# Patient Record
Sex: Male | Born: 1950 | Race: White | Hispanic: No | Marital: Single | Smoking: Former smoker
Health system: Southern US, Community
[De-identification: ages and names within clinical notes are randomized; demographics above are authoritative.]

## PROBLEM LIST (undated history)

## (undated) DIAGNOSIS — E119 Type 2 diabetes mellitus without complications: Secondary | ICD-10-CM

## (undated) DIAGNOSIS — N400 Enlarged prostate without lower urinary tract symptoms: Secondary | ICD-10-CM

## (undated) DIAGNOSIS — N189 Chronic kidney disease, unspecified: Secondary | ICD-10-CM

## (undated) DIAGNOSIS — E785 Hyperlipidemia, unspecified: Secondary | ICD-10-CM

## (undated) DIAGNOSIS — I10 Essential (primary) hypertension: Secondary | ICD-10-CM

## (undated) DIAGNOSIS — G459 Transient cerebral ischemic attack, unspecified: Secondary | ICD-10-CM

## (undated) DIAGNOSIS — K219 Gastro-esophageal reflux disease without esophagitis: Secondary | ICD-10-CM

## (undated) DIAGNOSIS — E039 Hypothyroidism, unspecified: Secondary | ICD-10-CM

## (undated) DIAGNOSIS — F32A Depression, unspecified: Secondary | ICD-10-CM

## (undated) DIAGNOSIS — F329 Major depressive disorder, single episode, unspecified: Secondary | ICD-10-CM

## (undated) HISTORY — PX: POLYPECTOMY: SHX149

## (undated) HISTORY — PX: VASECTOMY: SHX75

## (undated) HISTORY — DX: Essential (primary) hypertension: I10

## (undated) HISTORY — DX: Transient cerebral ischemic attack, unspecified: G45.9

## (undated) HISTORY — PX: CYSTOURETHROSCOPY: SHX476

## (undated) HISTORY — PX: APPENDECTOMY: SHX54

## (undated) HISTORY — DX: Chronic kidney disease, unspecified: N18.9

## (undated) HISTORY — DX: Gastro-esophageal reflux disease without esophagitis: K21.9

## (undated) HISTORY — DX: Depression, unspecified: F32.A

## (undated) HISTORY — DX: Type 2 diabetes mellitus without complications: E11.9

## (undated) HISTORY — PX: CHOLECYSTECTOMY: SHX55

## (undated) HISTORY — DX: Major depressive disorder, single episode, unspecified: F32.9

---

## 2012-12-29 ENCOUNTER — Ambulatory Visit: Payer: Self-pay | Admitting: Family Medicine

## 2013-01-08 ENCOUNTER — Ambulatory Visit: Payer: Self-pay | Admitting: Family Medicine

## 2013-01-09 LAB — COMPREHENSIVE METABOLIC PANEL
Alkaline Phosphatase: 131 U/L — ABNORMAL HIGH
Bilirubin,Total: 0.3 mg/dL (ref 0.2–1.0)
Calcium, Total: 9.8 mg/dL (ref 8.5–10.1)
Chloride: 103 mmol/L (ref 98–107)
EGFR (African American): 33 — ABNORMAL LOW
EGFR (Non-African Amer.): 28 — ABNORMAL LOW
Glucose: 372 mg/dL — ABNORMAL HIGH (ref 65–99)
Osmolality: 293 (ref 275–301)
Potassium: 5.5 mmol/L — ABNORMAL HIGH (ref 3.5–5.1)
SGOT(AST): 26 U/L (ref 15–37)
Sodium: 133 mmol/L — ABNORMAL LOW (ref 136–145)

## 2013-01-09 LAB — URINALYSIS, COMPLETE
Bacteria: NONE SEEN
Glucose,UR: >=500 mg/dL (ref 0–75)
Ketone: NEGATIVE
Leukocyte Esterase: NEGATIVE
Nitrite: NEGATIVE
Ph: 6 (ref 4.5–8.0)
Protein: 100
Specific Gravity: 1.015 (ref 1.003–1.030)
Squamous Epithelial: NONE SEEN

## 2013-01-09 LAB — CBC WITH DIFFERENTIAL/PLATELET
Basophil #: 0 10*3/uL (ref 0.0–0.1)
Eosinophil #: 0.2 10*3/uL (ref 0.0–0.7)
HGB: 12.6 g/dL — ABNORMAL LOW (ref 13.0–18.0)
Lymphocyte #: 4.4 10*3/uL — ABNORMAL HIGH (ref 1.0–3.6)
MCHC: 33.4 g/dL (ref 32.0–36.0)
MCV: 95 fL (ref 80–100)
Monocyte #: 1.6 x10 3/mm — ABNORMAL HIGH (ref 0.2–1.0)
Monocyte %: 16.8 %
Neutrophil #: 3.2 10*3/uL (ref 1.4–6.5)
Neutrophil %: 33.9 %
Platelet: 394 10*3/uL (ref 150–440)
RBC: 3.96 10*6/uL — ABNORMAL LOW (ref 4.40–5.90)
RDW: 12.9 % (ref 11.5–14.5)
WBC: 9.4 10*3/uL (ref 3.8–10.6)

## 2013-01-10 ENCOUNTER — Inpatient Hospital Stay: Payer: Self-pay | Admitting: Specialist

## 2013-01-10 DIAGNOSIS — R55 Syncope and collapse: Secondary | ICD-10-CM

## 2013-01-10 LAB — BASIC METABOLIC PANEL
Anion Gap: 5 — ABNORMAL LOW (ref 7–16)
BUN: 36 mg/dL — ABNORMAL HIGH (ref 7–18)
Calcium, Total: 8.7 mg/dL (ref 8.5–10.1)
Glucose: 315 mg/dL — ABNORMAL HIGH (ref 65–99)
Potassium: 5.1 mmol/L (ref 3.5–5.1)
Sodium: 135 mmol/L — ABNORMAL LOW (ref 136–145)

## 2013-01-11 LAB — BASIC METABOLIC PANEL
Calcium, Total: 8.3 mg/dL — ABNORMAL LOW (ref 8.5–10.1)
Co2: 24 mmol/L (ref 21–32)
EGFR (African American): 56 — ABNORMAL LOW
EGFR (Non-African Amer.): 48 — ABNORMAL LOW
Glucose: 245 mg/dL — ABNORMAL HIGH (ref 65–99)
Osmolality: 285 (ref 275–301)

## 2013-01-17 ENCOUNTER — Ambulatory Visit: Payer: Self-pay | Admitting: Family Medicine

## 2014-05-09 NOTE — H&P (Signed)
PATIENT NAME:  Dillon Thomas, Dillon Thomas MR#:  [id number] DATE OF BIRTH:  Dec 17, 1950  DATE OF ADMISSION:  01/10/2013  PRIMARY CARE PHYSICIAN: Dr. Kym Groom.   CHIEF COMPLAINT: Abnormal labs.   HISTORY OF PRESENT ILLNESS: This is a 64 year old male with a history of hypertension, chronic renal failure, diabetes, TIA who presents at his primary care physician's request for hyperkalemia. Apparently, the patient has recently moved from [place]. Had some labs at his PCP's office. They called him today because his potassium was 6.2, so he was asked to go to Urgent Care but Urgent Care was closed so he came here. Here, his potassium is 5.5. He received Kayexalate, and his kidney function is 2.36.   The patient also stated that over the past few days, he has fallen and at times he feels dizzy when standing up. He does not have his medications at this time. The patient says that he gets dizzy on standing and sometimes with turning his head. He actually has passed out a few times over the past week. No symptoms prior to passing out.     REVIEW OF SYSTEMS:  CONSTITUTIONAL: No fever. Positive fatigue. No weakness, weight loss, weight gain.  EYES: No blurred or double vision.  ENT: No ear pain, hearing loss, seasonal allergies, postnasal drip or sinus pain.  RESPIRATORY: No cough, wheezing, hemoptysis or painful respirations.  CARDIOVASCULAR: No chest pain, orthopnea, edema, arrhythmia. Positive syncope.  GASTROINTESTINAL: Positive history of GERD.   GENITOURINARY: No dysuria or hematuria.  ENDOCRINE: No polyuria or polydipsia.  HEMATOLOGIC AND LYMPHATIC: Positive easy bruising.  SKIN: No rash or lesions.   MUSCULOSKELETAL: Positive history of gout. No swelling in his joints.  NEUROLOGIC: Positive history of TIA.   PSYCHIATRIC: No history of anxiety or depression.   PAST MEDICAL HISTORY:  1. Hypothyroidism.  2. Waldenstrom macroglobulinemia.  3. History of COPD.   4. Peripheral neuropathy.  5. Diabetes.  6.  Chronic kidney disease.  7. GERD.   8. Gout.  9. Hyperlipidemia.  10. TIA.  11. Hypertension.   MEDICATIONS: The patient does not know his medications.   ALLERGIES: PENICILLIN AND TETRACYCLINE he says cause a rash and hives.   FAMILY HISTORY: No history of CAD or CVA.   SOCIAL HISTORY: The patient quit drinking. He has very occasional smoking, maybe once every few months. He is not interested in quitting this.   SURGICAL HISTORY:  1. Appendectomy.  2. Cholecystectomy.  3. Shoulder surgery.  4. Polyps on his vocal cord.   PHYSICAL EXAMINATION:  VITAL SIGNS: Temperature 97.9, pulse is 75, respirations 18, blood pressure 113/56 and 98% on room air.  GENERAL: The patient is alert, oriented, not in acute distress.  HEENT: Head is atraumatic. Pupils are round. Sclerae anicteric. Mucous membranes are moist. Oropharynx is clear.  NECK: Supple without JVD, carotid bruit or enlarged thyroid.  CARDIOVASCULAR: Regular rate and rhythm. No murmurs, gallops or rubs. PMI is not displaced.   ABDOMEN: Obese. Bowel sounds are positive. Nontender, nondistended. Hard to appreciate organomegaly due to body habitus.  EXTREMITIES: No clubbing, cyanosis or edema.  NEUROLOGIC: Cranial nerves II through XII are intact. No focal deficits. Cerebellar exam is normal.   MUSCULOSKELETAL: Strength is 5 out of 5 in all extremities. No pathology to digits or nails.  SKIN: Without rash or lesions.  BACK: No CVA or vertebral tenderness.   LABORATORIES: Sodium 133, potassium 5.5, chloride 103, bicarb 26, BUN 45, creatinine 2.36, glucose 372. Bilirubin 0.3, alk phos 131, ALT  40, AST 26, total protein 8.8. Urinalysis shows no LCE or nitrites. CT of the head shows no acute intracranial hemorrhage or CVA. Left hip x-ray shows no acute fracture. Chest x-ray shows no acute cardiopulmonary disease. EKG is normal sinus rhythm. No ST elevation or depression.   ASSESSMENT AND PLAN: A 64 year old male who was sent from primary  care physician's office with hyperkalemia and renal failure.  1. Hyperkalemia: Likely secondary to medications, although we do not have his medications, but I suspect the patient is taking an ACE inhibitor. He also has renal failure which is contributing to his hyperkalemia. He has received Kayexalate. Will continue to monitor his potassium. Once we know his medications, will have a clear indication as to the etiology of his hyperkalemia.  2. Acute renal failure on chronic renal failure: Unclear what his baseline is. He just recently moved here from [place]. We cannot obtain any medical history as this is a holiday, but on Friday we may be able to obtain some information if the patient is still here. Will order renal ultrasound. The patient will probably need a renal followup which can be arranged by his primary care physician or prior to discharge.  3. Syncope: The patient has had a few episodes of syncope. Will order an EEG as well as carotid Dopplers and echocardiogram to evaluate the clear etiology of this syncope. I cannot obtain a great story from this patient. Again, it is a holiday weekend. I am not sure exactly what will be able to be done, but I do want to rule out a cardiac or neurologic etiology, especially given his history of transient ischemic attacks in the past.  4. History of transient ischemic attack: Will continue Plavix as the patient does know he is taking Plavix daily.  5. Hypertension: Unknown what medications he is on. His blood pressure seems reasonable at this time. Will continue to monitor. The patient's wife is to bring in all medications tomorrow.  6. Diabetes: For now, the patient is on sliding scale insulin. Will continue to monitor. I will put him on an American Diabetic Association diet, and once we have his medication information, we may be able to restart his outpatient medications.   The patient is FULL CODE status.   TIME SPENT: Approximately 45 minutes.    ____________________________ Donell Beers. Benjie Karvonen, MD spm:gb D: 01/10/2013 00:34:54 ET T: 01/10/2013 01:48:14 ET JOB#: [id number]  cc: Soliyana Mcchristian P. Benjie Karvonen, MD, <Dictator> [name], MD Roberto Hlavaty P Yahmir Sokolov MD ELECTRONICALLY SIGNED 01/11/2013 0:01

## 2014-05-10 NOTE — Discharge Summary (Signed)
PATIENT NAME:  Dillon Thomas, Dillon Thomas MR#:  [id number] DATE OF BIRTH:  October 03, 1950  DATE OF ADMISSION:  01/10/2013 DATE OF DISCHARGE:  01/11/2013  For a detailed note, please take a look at the history and physical done on admission by Dr.  Benjie Karvonen.   DIAGNOSES AT DISCHARGE: As follows:  1.  Acute on chronic renal failure.  2.  Hyperkalemia.  3.  Syncope, likely vasovagal.  4.  Uncontrolled diabetes.  5.  Hypertension.  6.  Hyperlipidemia.  7.  History of gout.  8.  Hypothyroidism.   DIET:  The patient is being discharged on an American diabetic Association low-sodium, low-fat diet.   ACTIVITY: As tolerated.   FOLLOW-UP:  With Dr. Johny Drilling in the next 1 to 2 weeks.   DISCHARGE MEDICATIONS:  Magnesium oxide 400 mg daily, Toprol-XL 37.5 mg daily, tramadol 50 mg q. 6 hours as needed, Wellbutrin XL 150 mg 3 tabs daily, vitamin D3 2000 international units daily, vitamin B12 500 mcg daily, Monopril 10 mg daily, vitamin D2 50,000 international units weekly, multivitamin daily, aspirin 81 mg daily, Plavix 75 mg daily, Bentyl 10 mg as needed, Flovent 1 puff b.i.d. as needed, Synthroid 25 mcg daily, Zantac 150 mg b.i.d., Crestor 10 mg daily, albuterol inhaler 2 puffs q. 2 hours as needed, FiberCon 625 mg daily, colchicine 0.6 mg 2 tabs as needed for gout flare, Insulin regular 22 units b.i.d., Neurontin 300 mg 2 caps in the morning 1 capsule at night, allopurinol 100 mg 2 tabs daily.   PERTINENT STUDIES DONE DURING THE HOSPITAL COURSE: Are as follows: A CT scan of the head done without contrast, which showed no acute intracranial process. An ultrasound of the carotids showing no hemodynamically significant carotid artery stenosis. Ultrasound of the kidneys showing no evidence of any hydronephrosis. A 2-dimensional echocardiogram showing left ventricular ejection fraction to be 55% to 60%, impaired relaxation pattern of LV diastolic filling.   HOSPITAL COURSE: This is a 64 year old male with medical problems  as mentioned above, presented to the hospital due to syncope and also noted to be in acute on chronic renal failure with hyperkalemia.   PROBLEMS: 1.  Syncope. The most likely cause of the patient's syncope was vasovagal in nature. The patient had an extensive work-up in the hospital for neurocardiogenic syncope, which was negative. His CT head was negative. His carotid duplex was negative. His echocardiogram showed normal ejection fraction. He had no alarms on telemetry or any evidence of arrhythmia. He has had no further episodes of syncope and therefore is being discharged home.   2.  Acute on chronic renal failure. The patient has chronic kidney disease stage III. His baseline creatinines is around 1.3 to 1.5. He presented to the hospital with creatinine of 2.3, and potassium of 5.5. This is probably related to prerenal azotemia complicated with the use of Bactrim for underlying infection. The patient was started on Bactrim for a scrotal cellulitis. The patient's kidney function got worse due to being on Bactrim. When he presented to the hospital, his creatinine was up to 2.3. He was hydrated with IV fluids. His Bactrim was held. He was also taken off his ACE inhibitors temporarily. His kidney function has now improved with IV fluids. He likely needs outpatient referral for Nephrology on a scheduled basis. He will be referred to [facility] for now. His renal ultrasound showed no evidence of hydronephrosis. The patient's creatinine is now back to baseline.   3.  Hyperkalemia. This was likely secondary  to the acute renal failure and the use of Bactrim. The patient was given some Kayexalate. His potassium has now normalized as his renal function has improved.   4.  Diabetes. The patient's blood sugars were somewhat labile although improved with sliding-scale insulin coverage. He will continue his regular insulin twice daily as mentioned and further titrations to his diabetic meds should be done  by his primary care physician.   5.  Diabetic neuropathy. The patient was maintain on his Neurontin. He will resume that.   6.  Hyperlipidemia. The patient was maintained on his Crestor and he will resume that.    7.  Hypertension. The patient was maintained on his Toprol and Monopril and he will resume those upon discharge. He was taken off his HCTZ presently.   The patient is a FULL CODE.   DISPOSITION: He is being discharged home.   TIME SPENT WITH DISCHARGE:  40 minutes.   ____________________________ Belia Heman. Verdell Carmine, MD vjs:dp D: 01/11/2013 22:00:01 ET T: 01/12/2013 07:10:11 ET JOB#: [id number]  cc: Belia Heman. Verdell Carmine, MD, <Dictator> [name], MD Henreitta Leber MD ELECTRONICALLY SIGNED 02/13/2013 11:14

## 2014-10-20 ENCOUNTER — Ambulatory Visit: Payer: MEDICARE | Attending: Neurology

## 2014-10-20 DIAGNOSIS — G4733 Obstructive sleep apnea (adult) (pediatric): Secondary | ICD-10-CM | POA: Insufficient documentation

## 2014-12-15 ENCOUNTER — Ambulatory Visit
Admission: RE | Admit: 2014-12-15 | Discharge: 2014-12-15 | Disposition: A | Discharge status: Home / Self Care | Payer: MEDICARE | Source: Ambulatory Visit | Attending: Family Medicine | Admitting: Family Medicine

## 2014-12-15 ENCOUNTER — Other Ambulatory Visit: Payer: Self-pay | Admitting: Family Medicine

## 2014-12-15 DIAGNOSIS — R109 Unspecified abdominal pain: Secondary | ICD-10-CM | POA: Insufficient documentation

## 2014-12-15 DIAGNOSIS — R319 Hematuria, unspecified: Secondary | ICD-10-CM | POA: Diagnosis present

## 2014-12-15 DIAGNOSIS — N200 Calculus of kidney: Secondary | ICD-10-CM | POA: Insufficient documentation

## 2014-12-15 DIAGNOSIS — R599 Enlarged lymph nodes, unspecified: Secondary | ICD-10-CM | POA: Insufficient documentation

## 2014-12-15 DIAGNOSIS — N289 Disorder of kidney and ureter, unspecified: Secondary | ICD-10-CM | POA: Insufficient documentation

## 2015-08-18 ENCOUNTER — Ambulatory Visit: Payer: MEDICARE | Attending: Neurology | Admitting: Speech Pathology

## 2015-08-18 DIAGNOSIS — R4701 Aphasia: Secondary | ICD-10-CM | POA: Insufficient documentation

## 2015-08-18 DIAGNOSIS — R41841 Cognitive communication deficit: Secondary | ICD-10-CM | POA: Insufficient documentation

## 2015-08-20 ENCOUNTER — Ambulatory Visit: Payer: MEDICARE | Admitting: Speech Pathology

## 2015-08-20 DIAGNOSIS — R4701 Aphasia: Secondary | ICD-10-CM | POA: Diagnosis not present

## 2015-08-20 DIAGNOSIS — R41841 Cognitive communication deficit: Secondary | ICD-10-CM

## 2015-08-21 ENCOUNTER — Encounter: Payer: Self-pay | Admitting: Speech Pathology

## 2015-08-21 NOTE — Therapy (Signed)
[place] MAIN [facility] SERVICES [address] [place], [place], [address] Phone: [phone]   Fax:  [phone]  Speech Language Pathology Evaluation  Patient Details  Name: Dillon Thomas MRN: [id number] Date of Birth: 1950/02/13 Referring Provider: Dr. Melrose Nakayama  Encounter Date: 08/20/2015      End of Session - 08/21/15 1206    Visit Number 1   Number of Visits 17   Date for SLP Re-Evaluation 10/23/15   SLP Start Time 23   SLP Stop Time  1200   SLP Time Calculation (min) 60 min   Activity Tolerance Patient tolerated treatment well      Past Medical History:  Diagnosis Date  . Chronic kidney disease   . Depression   . Diabetes mellitus without complication ([place])   . GERD (gastroesophageal reflux disease)   . Hypertension   . TIA (transient ischemic attack)     History reviewed. No pertinent surgical history.  There were no vitals filed for this visit.          SLP Evaluation [facility] - 08/21/15 0001      SLP Visit Information   SLP Received On 08/20/15   Referring Provider Dr. Melrose Nakayama   Onset Date 08/12/2015   Medical Diagnosis Word Finding Difficulty, Mild Cognitive Impairment      Subjective   Subjective 65 year old man referred for speech therapy secondary word finding difficulty and Mild Cognitive Impairment.   Patient/Family Stated Goal The patient is having difficulty expressing his personal goals     Prior Functional Status   Cognitive/Linguistic Baseline Baseline deficits   Baseline deficit details --  Deficits noted 6-7 years ago following TIA     Cognition   Overall Cognitive Status History of cognitive impairments - at baseline  Mild Cognitive Impairment     Auditory Comprehension   Overall Auditory Comprehension Impaired   Overall Auditory Comprehension Comments Reduced for abstract verbal information     Reading Comprehension   Reading Status Impaired  Reduced for abstract written information     Verbal Expression   Overall Verbal Expression Impaired     Written Expression   Overall Writen Expression May have baseline writing impairment     Oral Motor/Sensory Function   Overall Oral Motor/Sensory Function Appears within functional limits for tasks assessed     Motor Speech   Overall Motor Speech Appears within functional limits for tasks assessed     Standardized Assessments   Standardized Assessments  Western Aphasia Battery revised        Western Aphasia Battery- Revised   Spontaneous Speech      Information content  9/10       Fluency   10/10      Comprehension     Yes/No questions  60/60        Auditory Word Recognition 60/60        Sequential Commands 70/80     Repetition   96/100      Naming    Object Naming  57/60        Word Fluency   12/20        Sentence Completion 10/10        Responsive Speech   10/10      Aphasia Quotient  94/100    Reading and Writing    Reading   84/100        Writing   Incomplete testing        SLP Education -  08/21/15 1206    Education provided Yes   Education Details Role of SLP in addressing aphasia and mild cognitive impairment   Person(s) Educated Patient   Methods Explanation   Comprehension Verbalized understanding            SLP Long Term Goals - 08/21/15 1209      SLP LONG TERM GOAL #1   Title Patient will complete high level word finding tasks with 80% accuracy.   Time 8   Period Weeks   Status New     SLP LONG TERM GOAL #2   Title Patient will write grammatical and cogent sentence to complete simple/concrete linguistic task with 80% accuracy.   Time 8   Period Weeks   Status New     SLP LONG TERM GOAL #3   Title Patient will demonstrate reading comprehension for paragraphs with 80% accuracy.    Time 8   Period Weeks   Status New     SLP LONG TERM GOAL #4   Title Patient will identify cognitive/communication barriers and participate in developing functional compensatory strategies.   Time  8   Period Weeks   Status New     SLP LONG TERM GOAL #5   Title Patient will demonstrate functional cognitive-communication skills for independent completion of personal responsibilities.   Time 8   Period Weeks   Status New          Plan - 08/21/15 1207    Clinical Impression Statement 65 year old man with word finding difficulty and Mild Cognitive Impairment is presenting with mild aphasia characterized by word finding difficulty, reduced information content of spontaneous speech, and reduced comprehension of abstract oral/written information.  Although testing of writing is incomplete, the patient appears to have baseline writing difficulties.  The patient will benefit from skilled speech therapy for restorative and compensatory treatment of aphasia as well as ongoing assessment of cognitive needs.      Speech Therapy Frequency 2x / week   Duration Other (comment)  8 weeks   Treatment/Interventions Cognitive reorganization;Internal/external aids;Functional tasks;Compensatory strategies;Patient/family education   Potential to Achieve Goals Good   Potential Considerations Ability to learn/carryover information;Co-morbidities;Cooperation/participation level;Previous level of function;Severity of impairments;Family/community support   SLP Home Exercise Plan Patient is to return with examples of functional communication and cognitive problems   Consulted and Agree with Plan of Care Patient      Patient will benefit from skilled therapeutic intervention in order to improve the following deficits and impairments:   Aphasia - Plan: SLP plan of care cert/re-cert  Cognitive communication deficit - Plan: SLP plan of care cert/re-cert      G-Codes - [id number] 05-13-09    Functional Assessment Tool Used Western Aphasia Battery- REvised, clinical judgment   Functional Limitations Spoken language expressive   Spoken Language Expression Current Status [phone]) At least 20 percent but less than 40  percent impaired, limited or restricted   Spoken Language Expression Goal Status [id number]) At least 1 percent but less than 20 percent impaired, limited or restricted      Problem List There are no active problems to display for this patient.  [name], MS/CCC- SLP  Lou Miner 08/21/2015, 12:15 PM  [place] MAIN [facility] SERVICES [address] [place], [place], [address] Phone: [phone]   Fax:  [phone]  Name: Dillon Thomas MRN: [id number] Date of Birth: 11/03/1950

## 2015-08-25 ENCOUNTER — Ambulatory Visit: Payer: MEDICARE | Admitting: Speech Pathology

## 2015-08-25 DIAGNOSIS — R4701 Aphasia: Secondary | ICD-10-CM | POA: Diagnosis not present

## 2015-08-25 DIAGNOSIS — R41841 Cognitive communication deficit: Secondary | ICD-10-CM

## 2015-08-26 ENCOUNTER — Encounter: Payer: Self-pay | Admitting: Speech Pathology

## 2015-08-26 NOTE — Therapy (Signed)
[place] MAIN [facility] SERVICES [address] [place], [place], [address] Phone: [phone]   Fax:  [phone]  Speech Language Pathology Treatment  Patient Details  Name: Dillon Thomas MRN: [id number] Date of Birth: 01/16/1951 Referring Provider: Dr. Melrose Nakayama  Encounter Date: 08/25/2015      End of Session - 08/26/15 1430    Visit Number 2   Number of Visits 17   Date for SLP Re-Evaluation 10/23/15   SLP Start Time 1000   SLP Stop Time  1100   SLP Time Calculation (min) 60 min   Activity Tolerance Patient tolerated treatment well      Past Medical History:  Diagnosis Date  . Chronic kidney disease   . Depression   . Diabetes mellitus without complication ([place])   . GERD (gastroesophageal reflux disease)   . Hypertension   . TIA (transient ischemic attack)     History reviewed. No pertinent surgical history.  There were no vitals filed for this visit.      Subjective Assessment - 08/26/15 1429    Subjective The patient returned with requested information   Currently in Pain? No/denies               ADULT SLP TREATMENT - 08/26/15 0001      General Information   Behavior/Cognition Alert;Cooperative;Pleasant mood     Treatment Provided   Treatment provided Cognitive-Linquistic     Pain Assessment   Pain Assessment No/denies pain     Cognitive-Linquistic Treatment   Treatment focused on Aphasia;Cognition   Skilled Treatment WRITING: writing is functional for basic note taking/journal use.  AUDITORY COMPREHENSION/MEMORY: answer multi-unit yes/no questions easily.  VERBAL EXPRESSION: generate 5 items in a category with 70% accuracy.  Name category given 3 members with 90% accuracy.  COMMUNICATION/COGNITIVE BARRIERS: patient returned with list of communication/cognitive barriers as requested.  He was provided with written feedback RE: each listed item.  He was given homework (a reading task) and a daily log to begin.      Assessment / Recommendations / Plan   Plan Continue with current plan of care     Progression Toward Goals   Progression toward goals Progressing toward goals          SLP Education - 08/26/15 1430    Education provided Yes   Education Details Review list of identified barriers   Person(s) Educated Patient   Methods Explanation;Handout   Comprehension Verbalized understanding            SLP Long Term Goals - 08/21/15 1209      SLP LONG TERM GOAL #1   Title Patient will complete high level word finding tasks with 80% accuracy.   Time 8   Period Weeks   Status New     SLP LONG TERM GOAL #2   Title Patient will write grammatical and cogent sentence to complete simple/concrete linguistic task with 80% accuracy.   Time 8   Period Weeks   Status New     SLP LONG TERM GOAL #3   Title Patient will demonstrate reading comprehension for paragraphs with 80% accuracy.    Time 8   Period Weeks   Status New     SLP LONG TERM GOAL #4   Title Patient will identify cognitive/communication barriers and participate in developing functional compensatory strategies.   Time 8   Period Weeks   Status New     SLP LONG TERM GOAL #5   Title Patient  will demonstrate functional cognitive-communication skills for independent completion of personal responsibilities.   Time 8   Period Weeks   Status New          Plan - 08/26/15 1431    Clinical Impression Statement The patient is completing linguistically simple/concrete tasks well.  We will continue to explore more complex linguistic contexts as well as functional compensatory strategies.   Speech Therapy Frequency 2x / week   Duration Other (comment)   Treatment/Interventions Cognitive reorganization;Internal/external aids;Functional tasks;Compensatory strategies;Patient/family education   Potential to Achieve Goals Good   Potential Considerations Ability to learn/carryover information;Co-morbidities;Cooperation/participation  level;Previous level of function;Severity of impairments;Family/community support   SLP Home Exercise Plan Homework notebook given   Consulted and Agree with Plan of Care Patient      Patient will benefit from skilled therapeutic intervention in order to improve the following deficits and impairments:   Cognitive communication deficit  Aphasia    Problem List There are no active problems to display for this patient.  [name], MS/CCC- SLP  Lou Miner 08/26/2015, 2:32 PM  [place] MAIN [facility] SERVICES [address] [place], [place], [address] Phone: [phone]   Fax:  [phone]   Name: Dillon Thomas MRN: [id number] Date of Birth: 10/27/1950

## 2015-08-27 ENCOUNTER — Ambulatory Visit: Payer: MEDICARE | Admitting: Speech Pathology

## 2015-08-27 DIAGNOSIS — R4701 Aphasia: Secondary | ICD-10-CM

## 2015-08-27 DIAGNOSIS — R41841 Cognitive communication deficit: Secondary | ICD-10-CM

## 2015-08-28 ENCOUNTER — Encounter: Payer: Self-pay | Admitting: Speech Pathology

## 2015-08-28 NOTE — Therapy (Signed)
[place] MAIN [facility] SERVICES [address] [place], [place], [address] Phone: [phone]   Fax:  [phone]  Speech Language Pathology Treatment  Patient Details  Name: Dillon Thomas MRN: [id number] Date of Birth: December 17, 1950 Referring Provider: Dr. Melrose Nakayama  Encounter Date: 08/27/2015      End of Session - 08/28/15 1113    Visit Number 3   Number of Visits 17   Date for SLP Re-Evaluation 10/23/15   SLP Start Time 84   SLP Stop Time  1120   SLP Time Calculation (min) 60 min   Activity Tolerance Patient tolerated treatment well      Past Medical History:  Diagnosis Date  . Chronic kidney disease   . Depression   . Diabetes mellitus without complication ([place])   . GERD (gastroesophageal reflux disease)   . Hypertension   . TIA (transient ischemic attack)     History reviewed. No pertinent surgical history.  There were no vitals filed for this visit.      Subjective Assessment - 08/28/15 1112    Subjective Patient did not bring his notebook with him   Currently in Pain? No/denies               ADULT SLP TREATMENT - 08/28/15 0001      General Information   Behavior/Cognition Alert;Cooperative;Pleasant mood     Treatment Provided   Treatment provided Cognitive-Linquistic     Pain Assessment   Pain Assessment No/denies pain     Cognitive-Linquistic Treatment   Treatment focused on Aphasia;Cognition   Skilled Treatment WRITING: writing is functional for basic note taking/journal use.  AUDITORY COMPREHENSION/MEMORY: follow 2-step, 4-component verbal directions with 80% accuracy.  VERBAL EXPRESSION: generate 5 items in a category with 85% accuracy.  Name category given 3 members with 80% accuracy.  Complete simple verbal analogies (given choice of three words) with 85% accuracy. COMMUNICATION/COGNITIVE BARRIERS: patient did not bring his notebook.  He reports that he did the work sheet but did not begin taking notes as  requested.     Assessment / Recommendations / Plan   Plan Continue with current plan of care     Progression Toward Goals   Progression toward goals Progressing toward goals          SLP Education - 08/28/15 1113    Education provided Yes   Education Details Value of writing notes as a method to improve memory   Person(s) Educated Patient   Methods Explanation   Comprehension Verbalized understanding            SLP Long Term Goals - 08/21/15 1209      SLP LONG TERM GOAL #1   Title Patient will complete high level word finding tasks with 80% accuracy.   Time 8   Period Weeks   Status New     SLP LONG TERM GOAL #2   Title Patient will write grammatical and cogent sentence to complete simple/concrete linguistic task with 80% accuracy.   Time 8   Period Weeks   Status New     SLP LONG TERM GOAL #3   Title Patient will demonstrate reading comprehension for paragraphs with 80% accuracy.    Time 8   Period Weeks   Status New     SLP LONG TERM GOAL #4   Title Patient will identify cognitive/communication barriers and participate in developing functional compensatory strategies.   Time 8   Period Weeks   Status New  SLP LONG TERM GOAL #5   Title Patient will demonstrate functional cognitive-communication skills for independent completion of personal responsibilities.   Time 8   Period Weeks   Status New          Plan - 08/28/15 1114    Clinical Impression Statement The patient is completing linguistically simple/concrete tasks well.  We will continue to explore more complex linguistic contexts as well as functional compensatory strategies.   Speech Therapy Frequency 2x / week   Duration Other (comment)   Treatment/Interventions Cognitive reorganization;Internal/external aids;Functional tasks;Compensatory strategies;Patient/family education   Potential to Achieve Goals Good   Potential Considerations Ability to learn/carryover  information;Co-morbidities;Cooperation/participation level;Previous level of function;Severity of impairments;Family/community support   SLP Home Exercise Plan Homework notebook given   Consulted and Agree with Plan of Care Patient      Patient will benefit from skilled therapeutic intervention in order to improve the following deficits and impairments:   Aphasia  Cognitive communication deficit    Problem List There are no active problems to display for this patient.  [name], MS/CCC- SLP  Lou Miner 08/28/2015, 11:15 AM  [place] MAIN [facility] SERVICES [address] [place], [place], [address] Phone: [phone]   Fax:  [phone]   Name: Dillon Thomas MRN: [id number] Date of Birth: [birth year]

## 2015-09-02 ENCOUNTER — Ambulatory Visit: Payer: MEDICARE | Admitting: Speech Pathology

## 2015-09-03 ENCOUNTER — Ambulatory Visit: Payer: MEDICARE | Admitting: Speech Pathology

## 2015-09-04 ENCOUNTER — Ambulatory Visit: Payer: MEDICARE | Admitting: Speech Pathology

## 2015-09-04 ENCOUNTER — Encounter: Payer: Self-pay | Admitting: Speech Pathology

## 2015-09-04 DIAGNOSIS — R4701 Aphasia: Secondary | ICD-10-CM

## 2015-09-04 DIAGNOSIS — R41841 Cognitive communication deficit: Secondary | ICD-10-CM

## 2015-09-04 NOTE — Therapy (Signed)
[place] MAIN [facility] SERVICES [address] [place], [place], [address] Phone: [phone]   Fax:  [phone]  Speech Language Pathology Treatment  Patient Details  Name: Dillon Thomas MRN: [id number] Date of Birth: [birth year] Referring Provider: Dr. Melrose Nakayama  Encounter Date: 09/04/2015      End of Session - 09/04/15 1314    Visit Number 4   Number of Visits 17   Date for SLP Re-Evaluation 10/23/15   SLP Start Time 94   SLP Stop Time  1200   SLP Time Calculation (min) 60 min   Activity Tolerance Patient tolerated treatment well      Past Medical History:  Diagnosis Date  . Chronic kidney disease   . Depression   . Diabetes mellitus without complication ([place])   . GERD (gastroesophageal reflux disease)   . Hypertension   . TIA (transient ischemic attack)     History reviewed. No pertinent surgical history.  There were no vitals filed for this visit.      Subjective Assessment - 09/04/15 1313    Subjective The patient is frustrated that he is not making his therapies on time.   Currently in Pain? No/denies               ADULT SLP TREATMENT - 09/04/15 0001      General Information   Behavior/Cognition Alert;Cooperative;Pleasant mood     Treatment Provided   Treatment provided Cognitive-Linquistic     Pain Assessment   Pain Assessment No/denies pain     Cognitive-Linquistic Treatment   Treatment focused on Aphasia;Cognition   Skilled Treatment WRITING: writing is functional for basic note taking/journal use.  AUDITORY COMPREHENSION/MEMORY: Recall 16/16 words using sorting and remembering categories strategy.  Recall 5/5 people and associated object.   COMMUNICATION/COGNITIVE BARRIERS: The patient has arrived at the wrong time for his therapy session X3.  He reports today that he knows when the session is but finds that he gets distracted/involved in another project.  We discussed possible solutions, with the  patient actively participating, and the patient will use his phone to set alarms/reminders.  The patient is agreeable to developing an external memory aid and will bring a weekly calendar and notebook to his next session.     Assessment / Recommendations / Plan   Plan Continue with current plan of care     Progression Toward Goals   Progression toward goals Progressing toward goals          SLP Education - 09/04/15 1314    Education provided Yes   Education Details Value of having one system for all compensatory needs   Person(s) Educated Patient   Methods Explanation   Comprehension Verbalized understanding            SLP Long Term Goals - 08/21/15 1209      SLP LONG TERM GOAL #1   Title Patient will complete high level word finding tasks with 80% accuracy.   Time 8   Period Weeks   Status New     SLP LONG TERM GOAL #2   Title Patient will write grammatical and cogent sentence to complete simple/concrete linguistic task with 80% accuracy.   Time 8   Period Weeks   Status New     SLP LONG TERM GOAL #3   Title Patient will demonstrate reading comprehension for paragraphs with 80% accuracy.    Time 8   Period Weeks   Status New     SLP  LONG TERM GOAL #4   Title Patient will identify cognitive/communication barriers and participate in developing functional compensatory strategies.   Time 8   Period Weeks   Status New     SLP LONG TERM GOAL #5   Title Patient will demonstrate functional cognitive-communication skills for independent completion of personal responsibilities.   Time 8   Period Weeks   Status New          Plan - 09/04/15 1315    Clinical Impression Statement The patient is completing linguistically simple/concrete tasks well.  We will continue to explore more complex linguistic contexts as well as functional compensatory strategies.  The patient is actively participating in developing compensatory strategies.   Speech Therapy Frequency 2x / week    Duration Other (comment)   Treatment/Interventions Cognitive reorganization;Internal/external aids;Functional tasks;Compensatory strategies;Patient/family education   Potential to Achieve Goals Good   Potential Considerations Ability to learn/carryover information;Co-morbidities;Cooperation/participation level;Previous level of function;Severity of impairments;Family/community support   SLP Home Exercise Plan Homework notebook given; patient has written list of assignments   Consulted and Agree with Plan of Care Patient      Patient will benefit from skilled therapeutic intervention in order to improve the following deficits and impairments:   Aphasia  Cognitive communication deficit    Problem List There are no active problems to display for this patient.  [name], MS/CCC- SLP  Lou Miner 09/04/2015, 1:16 PM  [place] MAIN [facility] SERVICES [address] [place], [place], [address] Phone: [phone]   Fax:  [phone]   Name: Dillon Thomas MRN: [id number] Date of Birth: [birth year]

## 2015-09-07 ENCOUNTER — Ambulatory Visit: Payer: MEDICARE | Admitting: Speech Pathology

## 2015-09-07 DIAGNOSIS — R4701 Aphasia: Secondary | ICD-10-CM

## 2015-09-07 DIAGNOSIS — R41841 Cognitive communication deficit: Secondary | ICD-10-CM

## 2015-09-08 ENCOUNTER — Encounter: Payer: Self-pay | Admitting: Speech Pathology

## 2015-09-08 NOTE — Therapy (Signed)
[place] MAIN [facility] SERVICES [address] [place], [place], [address] Phone: [phone]   Fax:  [phone]  Speech Language Pathology Treatment  Patient Details  Name: Dillon Thomas MRN: [id number] Date of Birth: 06-08-1950 Referring Provider: Dr. Melrose Nakayama  Encounter Date: 09/07/2015      End of Session - 09/08/15 0840    Visit Number 5   Number of Visits 17   Date for SLP Re-Evaluation 10/23/15   SLP Start Time 1500   SLP Stop Time  1554   SLP Time Calculation (min) 54 min   Activity Tolerance Patient tolerated treatment well      Past Medical History:  Diagnosis Date  . Chronic kidney disease   . Depression   . Diabetes mellitus without complication ([place])   . GERD (gastroesophageal reflux disease)   . Hypertension   . TIA (transient ischemic attack)     History reviewed. No pertinent surgical history.  There were no vitals filed for this visit.      Subjective Assessment - 09/08/15 0839    Subjective The patient stated he used his phone with an alarm to remind him of appt today.   Currently in Pain? No/denies               ADULT SLP TREATMENT - 09/08/15 0001      General Information   Behavior/Cognition Alert;Cooperative     Treatment Provided   Treatment provided Cognitive-Linquistic     Pain Assessment   Pain Assessment No/denies pain     Cognitive-Linquistic Treatment   Treatment focused on Aphasia;Cognition   Skilled Treatment AUDITORY COMPREHENSION/MEMORY: Recall 2 step multi-component instructions and complete with 50% acc; improved to 100% when instruction was repeated.  COMMUNICATION/COGNITIVE BARRIERS: The patient arrived on time for therapy session today. Pt reported he used his phone today to give him a reminder so he would not forget. Pt did not bring a journal or a calendar in this session but reported he was going to purchase one this afternoon. Verbal Expression:  Pt stated at least ten items  in a given category with 60% acc independently; improved to 80% w/mod verbal cues. Pt stated one item in abstract category given three items in that category wth 69% acc indepedently. Pt completed analogies with 77% acc.      Assessment / Recommendations / Plan   Plan Continue with current plan of care     Progression Toward Goals   Progression toward goals Progressing toward goals          SLP Education - 09/08/15 0840    Education provided Yes   Education Details Memory compensatory aids   Person(s) Educated Patient   Methods Explanation   Comprehension Verbalized understanding            SLP Long Term Goals - 08/21/15 1209      SLP LONG TERM GOAL #1   Title Patient will complete high level word finding tasks with 80% accuracy.   Time 8   Period Weeks   Status New     SLP LONG TERM GOAL #2   Title Patient will write grammatical and cogent sentence to complete simple/concrete linguistic task with 80% accuracy.   Time 8   Period Weeks   Status New     SLP LONG TERM GOAL #3   Title Patient will demonstrate reading comprehension for paragraphs with 80% accuracy.    Time 8   Period Weeks   Status New  SLP LONG TERM GOAL #4   Title Patient will identify cognitive/communication barriers and participate in developing functional compensatory strategies.   Time 8   Period Weeks   Status New     SLP LONG TERM GOAL #5   Title Patient will demonstrate functional cognitive-communication skills for independent completion of personal responsibilities.   Time 8   Period Weeks   Status New          Plan - 09/08/15 0840    Clinical Impression Statement The patient is completing linguistically simple/concrete tasks well.  We will continue to explore more complex linguistic contexts as well as functional compensatory strategies.  The patient is actively participating in developing compensatory strategies.   Speech Therapy Frequency 2x / week   Duration Other (comment)   8 weeks   Treatment/Interventions Cognitive reorganization;Internal/external aids;Functional tasks;Compensatory strategies;Patient/family education   Potential to Achieve Goals Good   Potential Considerations Ability to learn/carryover information;Co-morbidities;Cooperation/participation level;Previous level of function;Severity of impairments;Family/community support   SLP Home Exercise Plan Homework notebook given; patient has written list of assignments   Consulted and Agree with Plan of Care Patient      Patient will benefit from skilled therapeutic intervention in order to improve the following deficits and impairments:   Aphasia  Cognitive communication deficit    Problem List There are no active problems to display for this patient.   Quinter,Ceriah Kohler 09/08/2015, 8:42 AM  [place] MAIN [facility] SERVICES [address] [place], [place], [address] Phone: [phone]   Fax:  [phone]   Name: Dillon Thomas MRN: [id number] Date of Birth: [birth year]

## 2015-09-10 ENCOUNTER — Ambulatory Visit: Payer: MEDICARE | Admitting: Speech Pathology

## 2015-09-14 ENCOUNTER — Encounter: Payer: Self-pay | Admitting: Speech Pathology

## 2015-09-14 ENCOUNTER — Ambulatory Visit: Payer: MEDICARE | Admitting: Speech Pathology

## 2015-09-14 DIAGNOSIS — R4701 Aphasia: Secondary | ICD-10-CM

## 2015-09-14 DIAGNOSIS — R41841 Cognitive communication deficit: Secondary | ICD-10-CM

## 2015-09-14 NOTE — Therapy (Signed)
[place] MAIN [facility] SERVICES [address] [place], [place], [address] Phone: [phone]   Fax:  [phone]  Speech Language Pathology Treatment  Patient Details  Name: JONERIK CULL MRN: [id number] Date of Birth: 03-12-1950 Referring Provider: Dr. Melrose Nakayama  Encounter Date: 09/14/2015      End of Session - 09/14/15 1547    Visit Number 6   Number of Visits 17   Date for SLP Re-Evaluation 10/23/15   SLP Start Time 1400   SLP Stop Time  1500   SLP Time Calculation (min) 60 min      Past Medical History:  Diagnosis Date  . Chronic kidney disease   . Depression   . Diabetes mellitus without complication ([place])   . GERD (gastroesophageal reflux disease)   . Hypertension   . TIA (transient ischemic attack)     History reviewed. No pertinent surgical history.  There were no vitals filed for this visit.      Subjective Assessment - 09/14/15 1546    Subjective The patient stated he used his phone with an alarm to remind him of appt today.   Currently in Pain? No/denies               ADULT SLP TREATMENT - 09/14/15 0001      General Information   Behavior/Cognition Alert;Cooperative     Treatment Provided   Treatment provided Cognitive-Linquistic     Pain Assessment   Pain Assessment No/denies pain     Cognitive-Linquistic Treatment   Treatment focused on Aphasia;Cognition   Skilled Treatment WRITING: writing is functional for basic note taking/journal use.  AUDITORY COMPREHENSION/MEMORY: Follow 2-step, 4-component verbal commands with 70% accuracy; 90% given one repetition of the stimulus.   COMMUNICATION/COGNITIVE BARRIERS: The patient brought the weekly planner and notebook we had discussed.  He was given written suggestions for implementing the 2 as external memory aids.  The patient has been using his phone to set alarms/reminders.  VERBAL EXPRESSION: name abstract category given 3 members with 70% accuracy.  State  item given 3 clue words with 40% independently and 80% given SLP cues.     Assessment / Recommendations / Plan   Plan Continue with current plan of care     Progression Toward Goals   Progression toward goals Progressing toward goals          SLP Education - 09/14/15 1546    Education provided Yes   Education Details External memory aids   Person(s) Educated Patient   Methods Explanation   Comprehension Verbalized understanding            SLP Long Term Goals - 08/21/15 1209      SLP LONG TERM GOAL #1   Title Patient will complete high level word finding tasks with 80% accuracy.   Time 8   Period Weeks   Status New     SLP LONG TERM GOAL #2   Title Patient will write grammatical and cogent sentence to complete simple/concrete linguistic task with 80% accuracy.   Time 8   Period Weeks   Status New     SLP LONG TERM GOAL #3   Title Patient will demonstrate reading comprehension for paragraphs with 80% accuracy.    Time 8   Period Weeks   Status New     SLP LONG TERM GOAL #4   Title Patient will identify cognitive/communication barriers and participate in developing functional compensatory strategies.   Time 8   Period  Weeks   Status New     SLP LONG TERM GOAL #5   Title Patient will demonstrate functional cognitive-communication skills for independent completion of personal responsibilities.   Time 8   Period Weeks   Status New          Plan - 09/14/15 1547    Clinical Impression Statement The patient is completing linguistically simple/concrete tasks well.  We will continue to explore more complex linguistic contexts as well as functional compensatory strategies.  The patient is actively participating in developing compensatory strategies.   Speech Therapy Frequency 2x / week   Duration Other (comment)   Treatment/Interventions Cognitive reorganization;Internal/external aids;Functional tasks;Compensatory strategies;Patient/family education   Potential  to Achieve Goals Good   Potential Considerations Ability to learn/carryover information;Co-morbidities;Cooperation/participation level;Previous level of function;Severity of impairments;Family/community support   SLP Home Exercise Plan Homework notebook given; patient has written list of assignments   Consulted and Agree with Plan of Care Patient      Patient will benefit from skilled therapeutic intervention in order to improve the following deficits and impairments:   Aphasia  Cognitive communication deficit    Problem List There are no active problems to display for this patient.  [name], [place], Susie 09/14/2015, 3:48 PM  [place] MAIN [facility] SERVICES [address] [place], [place], [address] Phone: [phone]   Fax:  [phone]   Name: KASIMIR ELLENBURG MRN: [id number] Date of Birth: 1950-05-03

## 2015-09-16 ENCOUNTER — Ambulatory Visit
Admission: RE | Admit: 2015-09-16 | Discharge: 2015-09-16 | Disposition: A | Discharge status: Home / Self Care | Payer: MEDICARE | Source: Ambulatory Visit | Attending: Family Medicine | Admitting: Family Medicine

## 2015-09-16 ENCOUNTER — Other Ambulatory Visit: Payer: Self-pay | Admitting: Family Medicine

## 2015-09-16 DIAGNOSIS — K5900 Constipation, unspecified: Secondary | ICD-10-CM | POA: Insufficient documentation

## 2015-09-17 ENCOUNTER — Ambulatory Visit: Payer: MEDICARE | Admitting: Speech Pathology

## 2015-10-02 ENCOUNTER — Encounter: Payer: Self-pay | Admitting: Speech Pathology

## 2015-10-02 ENCOUNTER — Ambulatory Visit: Payer: MEDICARE | Attending: Neurology | Admitting: Speech Pathology

## 2015-10-02 DIAGNOSIS — R4701 Aphasia: Secondary | ICD-10-CM | POA: Insufficient documentation

## 2015-10-02 NOTE — Therapy (Signed)
[place] [facility] SERVICES [address] [place], [place], [address] Phone: [phone]   Fax:  [phone]  Speech Language Pathology Treatment  Patient Details  Name: Dillon Thomas MRN: [id number] Date of Birth: Jan 04, 1951 Referring Provider: Dr. Melrose Nakayama  Encounter Date: 10/02/2015      End of Session - 10/02/15 1104    Visit Number 7   Number of Visits 17   Date for SLP Re-Evaluation 10/23/15   SLP Start Time 0900   SLP Stop Time  1000   SLP Time Calculation (min) 60 min   Activity Tolerance Patient tolerated treatment well      Past Medical History:  Diagnosis Date  . Chronic kidney disease   . Depression   . Diabetes mellitus without complication ([place])   . GERD (gastroesophageal reflux disease)   . Hypertension   . TIA (transient ischemic attack)     History reviewed. No pertinent surgical history.  There were no vitals filed for this visit.      Subjective Assessment - 10/02/15 1103    Subjective The patient arrived early. He indicated that he has been doing "pretty good" remembering things he needs to do and seems to have been effectively problem solving and planning for an upcoming move.    Currently in Pain? No/denies               ADULT SLP TREATMENT - 10/02/15 0001      General Information   Behavior/Cognition Alert;Cooperative     Treatment Provided   Treatment provided Cognitive-Linquistic     Pain Assessment   Pain Assessment No/denies pain     Cognitive-Linquistic Treatment   Treatment focused on Aphasia;Cognition   Skilled Treatment The patient arrived on time for his appointment. He reports he purchased a calendar and journal but left it at home. He also forgot his cell phone in the car. The patient reports he has used the calendar some and the journal not much. WORD-FINDING: The patient correctly completed 10/10 word-finding exercises answering "what" questions quickly and without  difficulty. No cues were provided. COGNITION--INTERPRETING CALENDARS/SCHEDULES AND PLANNING: 1) The patient used a calendar and key to answer questions. He required a leading question to interpret the topic of the calendar. He then answered 50% of questions correctly independently; increased to 100% with one verbal or visual cue. Required longer than average time to answer visually located information and answer questions. 2) The patient answered informational and problem-solving questions using a schedule and to-do list.  He answered 75% of questions accurately independently. Increased to 100% following one verbal or visual cue to draw his attention to a detail he had overlooked.      Assessment / Recommendations / Plan   Plan Continue with current plan of care     Progression Toward Goals   Progression toward goals Progressing toward goals          SLP Education - 10/02/15 1104    Education provided Yes   Education Details Discussed organizational and habit-forming behaviors to help reduce misplaced/lost household items. Also discussed use of calendar app, since patient indicated he uses his phone more regularly than notebook.    Person(s) Educated Patient   Methods Explanation   Comprehension Verbalized understanding            SLP Long Term Goals - 08/21/15 1209      SLP LONG TERM GOAL #1   Title Patient will complete high level word finding tasks  with 80% accuracy.   Time 8   Period Weeks   Status New     SLP LONG TERM GOAL #2   Title Patient will write grammatical and cogent sentence to complete simple/concrete linguistic task with 80% accuracy.   Time 8   Period Weeks   Status New     SLP LONG TERM GOAL #3   Title Patient will demonstrate reading comprehension for paragraphs with 80% accuracy.    Time 8   Period Weeks   Status New     SLP LONG TERM GOAL #4   Title Patient will identify cognitive/communication barriers and participate in developing functional  compensatory strategies.   Time 8   Period Weeks   Status New     SLP LONG TERM GOAL #5   Title Patient will demonstrate functional cognitive-communication skills for independent completion of personal responsibilities.   Time 8   Period Weeks   Status New          Plan - 10/02/15 1105    Clinical Impression Statement The patient demonstrates good problem-solving ability with somewhat slower processing speed for interpretation and integration of visual information. The patient required rephrasing and leading questions to help him to identify specific challenges related to memory and organization at home. At times, he desired affirmation that a planned solution for a real-life problem was appropriate. The patient reports frequent word-finding difficulty, which he resolves by most often waiting until he remembers the target words or occasionally by circumlocution. Any word-finding difficulties encountered during therapy session were quickly resolved without clinician intervention. The patient may benefit from organizational and mnemonic strategies to assist with memory complaints and time-keeping.    Speech Therapy Frequency 2x / week   Duration Other (comment)   Treatment/Interventions Cognitive reorganization;Internal/external aids;Functional tasks;Compensatory strategies;Patient/family education   Potential to Achieve Goals Good   Potential Considerations Ability to learn/carryover information;Co-morbidities;Cooperation/participation level;Previous level of function;Severity of impairments;Family/community support   SLP Home Exercise Plan Patient will bring phone next week to practice using calendar app to help with remembering tasks and keeping track of time. Patient will write down other challenges that he notices at home and bring list to next session. Will also ask girlfriend to add to the list and to share her preferred calendar app.    Consulted and Agree with Plan of Care Patient       Patient will benefit from skilled therapeutic intervention in order to improve the following deficits and impairments:   Aphasia    Problem List There are no active problems to display for this patient.   Rickard Rhymes  Graduate Student Clinician 10/02/2015, 11:06 AM  [place] [facility] SERVICES [address] [place], [place], [address] Phone: [phone]   Fax:  [phone]   Name: VIJAY LISTER MRN: [id number] Date of Birth: 08/19/1950

## 2015-10-07 ENCOUNTER — Ambulatory Visit: Payer: MEDICARE | Admitting: Speech Pathology

## 2015-10-09 ENCOUNTER — Ambulatory Visit: Payer: MEDICARE | Admitting: Speech Pathology

## 2015-10-23 ENCOUNTER — Encounter: Payer: MEDICARE | Admitting: Speech Pathology

## 2015-11-15 ENCOUNTER — Encounter (HOSPITAL_COMMUNITY): Payer: Self-pay | Admitting: Emergency Medicine

## 2015-11-15 ENCOUNTER — Inpatient Hospital Stay (HOSPITAL_COMMUNITY): Payer: MEDICARE

## 2015-11-15 ENCOUNTER — Emergency Department (HOSPITAL_COMMUNITY): Payer: MEDICARE

## 2015-11-15 ENCOUNTER — Inpatient Hospital Stay (HOSPITAL_COMMUNITY)
Admission: EM | Admit: 2015-11-15 | Discharge: 2015-11-23 | DRG: 637 | Disposition: A | Discharge status: Rehabilitation Facility | Payer: MEDICARE | Attending: Internal Medicine | Admitting: Internal Medicine

## 2015-11-15 DIAGNOSIS — Z823 Family history of stroke: Secondary | ICD-10-CM | POA: Diagnosis not present

## 2015-11-15 DIAGNOSIS — G9341 Metabolic encephalopathy: Secondary | ICD-10-CM

## 2015-11-15 DIAGNOSIS — I69920 Aphasia following unspecified cerebrovascular disease: Secondary | ICD-10-CM

## 2015-11-15 DIAGNOSIS — Z23 Encounter for immunization: Secondary | ICD-10-CM | POA: Diagnosis present

## 2015-11-15 DIAGNOSIS — I129 Hypertensive chronic kidney disease with stage 1 through stage 4 chronic kidney disease, or unspecified chronic kidney disease: Secondary | ICD-10-CM | POA: Diagnosis present

## 2015-11-15 DIAGNOSIS — T148XXA Other injury of unspecified body region, initial encounter: Secondary | ICD-10-CM | POA: Diagnosis not present

## 2015-11-15 DIAGNOSIS — IMO0001 Reserved for inherently not codable concepts without codable children: Secondary | ICD-10-CM

## 2015-11-15 DIAGNOSIS — D1800 Hemangioma unspecified site: Secondary | ICD-10-CM

## 2015-11-15 DIAGNOSIS — D62 Acute posthemorrhagic anemia: Secondary | ICD-10-CM

## 2015-11-15 DIAGNOSIS — Z87442 Personal history of urinary calculi: Secondary | ICD-10-CM

## 2015-11-15 DIAGNOSIS — E8779 Other fluid overload: Secondary | ICD-10-CM | POA: Diagnosis not present

## 2015-11-15 DIAGNOSIS — R32 Unspecified urinary incontinence: Secondary | ICD-10-CM | POA: Diagnosis present

## 2015-11-15 DIAGNOSIS — R739 Hyperglycemia, unspecified: Secondary | ICD-10-CM

## 2015-11-15 DIAGNOSIS — E785 Hyperlipidemia, unspecified: Secondary | ICD-10-CM | POA: Diagnosis present

## 2015-11-15 DIAGNOSIS — D72820 Lymphocytosis (symptomatic): Secondary | ICD-10-CM

## 2015-11-15 DIAGNOSIS — N179 Acute kidney failure, unspecified: Secondary | ICD-10-CM | POA: Diagnosis present

## 2015-11-15 DIAGNOSIS — R748 Abnormal levels of other serum enzymes: Secondary | ICD-10-CM

## 2015-11-15 DIAGNOSIS — E1165 Type 2 diabetes mellitus with hyperglycemia: Secondary | ICD-10-CM | POA: Diagnosis present

## 2015-11-15 DIAGNOSIS — N281 Cyst of kidney, acquired: Secondary | ICD-10-CM | POA: Diagnosis present

## 2015-11-15 DIAGNOSIS — E1100 Type 2 diabetes mellitus with hyperosmolarity without nonketotic hyperglycemic-hyperosmolar coma (NKHHC): Principal | ICD-10-CM | POA: Diagnosis present

## 2015-11-15 DIAGNOSIS — M47812 Spondylosis without myelopathy or radiculopathy, cervical region: Secondary | ICD-10-CM | POA: Diagnosis present

## 2015-11-15 DIAGNOSIS — D638 Anemia in other chronic diseases classified elsewhere: Secondary | ICD-10-CM

## 2015-11-15 DIAGNOSIS — K219 Gastro-esophageal reflux disease without esophagitis: Secondary | ICD-10-CM | POA: Diagnosis present

## 2015-11-15 DIAGNOSIS — N183 Chronic kidney disease, stage 3 (moderate): Secondary | ICD-10-CM | POA: Diagnosis present

## 2015-11-15 DIAGNOSIS — R000 Tachycardia, unspecified: Secondary | ICD-10-CM | POA: Diagnosis not present

## 2015-11-15 DIAGNOSIS — Z6831 Body mass index (BMI) 31.0-31.9, adult: Secondary | ICD-10-CM

## 2015-11-15 DIAGNOSIS — D649 Anemia, unspecified: Secondary | ICD-10-CM | POA: Diagnosis present

## 2015-11-15 DIAGNOSIS — E119 Type 2 diabetes mellitus without complications: Secondary | ICD-10-CM

## 2015-11-15 DIAGNOSIS — N2889 Other specified disorders of kidney and ureter: Secondary | ICD-10-CM

## 2015-11-15 DIAGNOSIS — S2243XA Multiple fractures of ribs, bilateral, initial encounter for closed fracture: Secondary | ICD-10-CM | POA: Diagnosis present

## 2015-11-15 DIAGNOSIS — Z79899 Other long term (current) drug therapy: Secondary | ICD-10-CM | POA: Diagnosis not present

## 2015-11-15 DIAGNOSIS — S32592A Other specified fracture of left pubis, initial encounter for closed fracture: Secondary | ICD-10-CM | POA: Diagnosis present

## 2015-11-15 DIAGNOSIS — Z8673 Personal history of transient ischemic attack (TIA), and cerebral infarction without residual deficits: Secondary | ICD-10-CM | POA: Diagnosis not present

## 2015-11-15 DIAGNOSIS — F0390 Unspecified dementia without behavioral disturbance: Secondary | ICD-10-CM | POA: Diagnosis present

## 2015-11-15 DIAGNOSIS — Z833 Family history of diabetes mellitus: Secondary | ICD-10-CM | POA: Diagnosis not present

## 2015-11-15 DIAGNOSIS — E8809 Other disorders of plasma-protein metabolism, not elsewhere classified: Secondary | ICD-10-CM | POA: Diagnosis present

## 2015-11-15 DIAGNOSIS — I10 Essential (primary) hypertension: Secondary | ICD-10-CM

## 2015-11-15 DIAGNOSIS — S51012A Laceration without foreign body of left elbow, initial encounter: Secondary | ICD-10-CM

## 2015-11-15 DIAGNOSIS — R5381 Other malaise: Secondary | ICD-10-CM | POA: Diagnosis present

## 2015-11-15 DIAGNOSIS — I209 Angina pectoris, unspecified: Secondary | ICD-10-CM | POA: Diagnosis present

## 2015-11-15 DIAGNOSIS — R569 Unspecified convulsions: Secondary | ICD-10-CM | POA: Diagnosis present

## 2015-11-15 DIAGNOSIS — I69951 Hemiplegia and hemiparesis following unspecified cerebrovascular disease affecting right dominant side: Secondary | ICD-10-CM

## 2015-11-15 DIAGNOSIS — Z9181 History of falling: Secondary | ICD-10-CM

## 2015-11-15 DIAGNOSIS — E1122 Type 2 diabetes mellitus with diabetic chronic kidney disease: Secondary | ICD-10-CM | POA: Diagnosis present

## 2015-11-15 DIAGNOSIS — D329 Benign neoplasm of meninges, unspecified: Secondary | ICD-10-CM | POA: Diagnosis present

## 2015-11-15 DIAGNOSIS — IMO0002 Reserved for concepts with insufficient information to code with codable children: Secondary | ICD-10-CM

## 2015-11-15 DIAGNOSIS — E46 Unspecified protein-calorie malnutrition: Secondary | ICD-10-CM | POA: Diagnosis not present

## 2015-11-15 DIAGNOSIS — Z87891 Personal history of nicotine dependence: Secondary | ICD-10-CM | POA: Diagnosis not present

## 2015-11-15 DIAGNOSIS — E871 Hypo-osmolality and hyponatremia: Secondary | ICD-10-CM | POA: Diagnosis present

## 2015-11-15 DIAGNOSIS — L89152 Pressure ulcer of sacral region, stage 2: Secondary | ICD-10-CM | POA: Insufficient documentation

## 2015-11-15 DIAGNOSIS — E1111 Type 2 diabetes mellitus with ketoacidosis with coma: Secondary | ICD-10-CM

## 2015-11-15 DIAGNOSIS — I639 Cerebral infarction, unspecified: Secondary | ICD-10-CM

## 2015-11-15 DIAGNOSIS — E872 Acidosis: Secondary | ICD-10-CM | POA: Diagnosis present

## 2015-11-15 DIAGNOSIS — G9340 Encephalopathy, unspecified: Secondary | ICD-10-CM | POA: Diagnosis present

## 2015-11-15 DIAGNOSIS — Z888 Allergy status to other drugs, medicaments and biological substances status: Secondary | ICD-10-CM

## 2015-11-15 DIAGNOSIS — R768 Other specified abnormal immunological findings in serum: Secondary | ICD-10-CM | POA: Diagnosis not present

## 2015-11-15 DIAGNOSIS — R778 Other specified abnormalities of plasma proteins: Secondary | ICD-10-CM

## 2015-11-15 DIAGNOSIS — Z794 Long term (current) use of insulin: Secondary | ICD-10-CM

## 2015-11-15 DIAGNOSIS — Z8249 Family history of ischemic heart disease and other diseases of the circulatory system: Secondary | ICD-10-CM | POA: Diagnosis not present

## 2015-11-15 DIAGNOSIS — R4182 Altered mental status, unspecified: Secondary | ICD-10-CM | POA: Diagnosis present

## 2015-11-15 DIAGNOSIS — J90 Pleural effusion, not elsewhere classified: Secondary | ICD-10-CM | POA: Diagnosis present

## 2015-11-15 DIAGNOSIS — Z9119 Patient's noncompliance with other medical treatment and regimen: Secondary | ICD-10-CM | POA: Diagnosis not present

## 2015-11-15 DIAGNOSIS — Z880 Allergy status to penicillin: Secondary | ICD-10-CM

## 2015-11-15 DIAGNOSIS — E876 Hypokalemia: Secondary | ICD-10-CM | POA: Diagnosis present

## 2015-11-15 DIAGNOSIS — I150 Renovascular hypertension: Secondary | ICD-10-CM | POA: Diagnosis not present

## 2015-11-15 DIAGNOSIS — I214 Non-ST elevation (NSTEMI) myocardial infarction: Secondary | ICD-10-CM

## 2015-11-15 DIAGNOSIS — M549 Dorsalgia, unspecified: Secondary | ICD-10-CM | POA: Diagnosis not present

## 2015-11-15 DIAGNOSIS — F329 Major depressive disorder, single episode, unspecified: Secondary | ICD-10-CM | POA: Diagnosis present

## 2015-11-15 DIAGNOSIS — E1142 Type 2 diabetes mellitus with diabetic polyneuropathy: Secondary | ICD-10-CM | POA: Diagnosis present

## 2015-11-15 DIAGNOSIS — E039 Hypothyroidism, unspecified: Secondary | ICD-10-CM | POA: Diagnosis present

## 2015-11-15 DIAGNOSIS — E669 Obesity, unspecified: Secondary | ICD-10-CM | POA: Diagnosis present

## 2015-11-15 DIAGNOSIS — R7989 Other specified abnormal findings of blood chemistry: Secondary | ICD-10-CM

## 2015-11-15 DIAGNOSIS — R401 Stupor: Secondary | ICD-10-CM

## 2015-11-15 DIAGNOSIS — R531 Weakness: Secondary | ICD-10-CM

## 2015-11-15 DIAGNOSIS — N184 Chronic kidney disease, stage 4 (severe): Secondary | ICD-10-CM

## 2015-11-15 DIAGNOSIS — E860 Dehydration: Secondary | ICD-10-CM | POA: Diagnosis present

## 2015-11-15 DIAGNOSIS — R079 Chest pain, unspecified: Secondary | ICD-10-CM | POA: Diagnosis not present

## 2015-11-15 DIAGNOSIS — E8770 Fluid overload, unspecified: Secondary | ICD-10-CM | POA: Diagnosis present

## 2015-11-15 HISTORY — DX: Hyperlipidemia, unspecified: E78.5

## 2015-11-15 HISTORY — DX: Hypothyroidism, unspecified: E03.9

## 2015-11-15 HISTORY — DX: Benign prostatic hyperplasia without lower urinary tract symptoms: N40.0

## 2015-11-15 LAB — RAPID URINE DRUG SCREEN, HOSP PERFORMED
AMPHETAMINES: NOT DETECTED
Barbiturates: NOT DETECTED
Benzodiazepines: NOT DETECTED
Cocaine: NOT DETECTED
OPIATES: NOT DETECTED
Tetrahydrocannabinol: NOT DETECTED

## 2015-11-15 LAB — CBG MONITORING, ED
GLUCOSE-CAPILLARY: 527 mg/dL — AB (ref 65–99)
GLUCOSE-CAPILLARY: 575 mg/dL — AB (ref 65–99)
Glucose-Capillary: >600 mg/dL (ref 65–99)

## 2015-11-15 LAB — COMPREHENSIVE METABOLIC PANEL
ALBUMIN: 1.9 g/dL — AB (ref 3.5–5.0)
ALK PHOS: 94 U/L (ref 38–126)
ALT: 14 U/L — AB (ref 17–63)
AST: 23 U/L (ref 15–41)
Anion gap: 14 (ref 5–15)
BILIRUBIN TOTAL: 0.6 mg/dL (ref 0.3–1.2)
BUN: 41 mg/dL — AB (ref 6–20)
CALCIUM: 8.4 mg/dL — AB (ref 8.9–10.3)
CO2: 14 mmol/L — ABNORMAL LOW (ref 22–32)
CREATININE: 3.48 mg/dL — AB (ref 0.61–1.24)
Chloride: 99 mmol/L — ABNORMAL LOW (ref 101–111)
GFR calc Af Amer: 20 mL/min — ABNORMAL LOW (ref >60)
GFR calc non Af Amer: 17 mL/min — ABNORMAL LOW (ref >60)
GLUCOSE: 870 mg/dL — AB (ref 65–99)
Potassium: 3.9 mmol/L (ref 3.5–5.1)
Sodium: 127 mmol/L — ABNORMAL LOW (ref 135–145)
TOTAL PROTEIN: 7.2 g/dL (ref 6.5–8.1)

## 2015-11-15 LAB — CBC
HEMATOCRIT: 31.1 % — AB (ref 39.0–52.0)
HEMOGLOBIN: 9 g/dL — AB (ref 13.0–17.0)
MCH: 27.4 pg (ref 26.0–34.0)
MCHC: 28.9 g/dL — ABNORMAL LOW (ref 30.0–36.0)
MCV: 94.8 fL (ref 78.0–100.0)
Platelets: 399 10*3/uL (ref 150–400)
RBC: 3.28 MIL/uL — ABNORMAL LOW (ref 4.22–5.81)
RDW: 14.5 % (ref 11.5–15.5)
WBC: 13.7 10*3/uL — AB (ref 4.0–10.5)

## 2015-11-15 LAB — I-STAT CHEM 8, ED
BUN: 40 mg/dL — AB (ref 6–20)
CREATININE: 3.3 mg/dL — AB (ref 0.61–1.24)
Calcium, Ion: 1.11 mmol/L — ABNORMAL LOW (ref 1.15–1.40)
Chloride: 99 mmol/L — ABNORMAL LOW (ref 101–111)
Glucose, Bld: >700 mg/dL (ref 65–99)
HCT: 29 % — ABNORMAL LOW (ref 39.0–52.0)
HEMOGLOBIN: 9.9 g/dL — AB (ref 13.0–17.0)
POTASSIUM: 4 mmol/L (ref 3.5–5.1)
Sodium: 131 mmol/L — ABNORMAL LOW (ref 135–145)
TCO2: 17 mmol/L (ref 0–100)

## 2015-11-15 LAB — AMMONIA: Ammonia: 19 umol/L (ref 9–35)

## 2015-11-15 LAB — BRAIN NATRIURETIC PEPTIDE: B Natriuretic Peptide: 597.3 pg/mL — ABNORMAL HIGH (ref 0.0–100.0)

## 2015-11-15 LAB — DIFFERENTIAL
BASOS ABS: 0 10*3/uL (ref 0.0–0.1)
Basophils Relative: 0 %
EOS PCT: 0 %
Eosinophils Absolute: 0 10*3/uL (ref 0.0–0.7)
Lymphocytes Relative: 4 %
Lymphs Abs: 0.5 10*3/uL — ABNORMAL LOW (ref 0.7–4.0)
MONOS PCT: 10 %
Monocytes Absolute: 1.4 10*3/uL — ABNORMAL HIGH (ref 0.1–1.0)
Neutro Abs: 11.8 10*3/uL — ABNORMAL HIGH (ref 1.7–7.7)
Neutrophils Relative %: 86 %

## 2015-11-15 LAB — I-STAT ARTERIAL BLOOD GAS, ED
ACID-BASE DEFICIT: 10 mmol/L — AB (ref 0.0–2.0)
Bicarbonate: 14.9 mmol/L — ABNORMAL LOW (ref 20.0–28.0)
O2 Saturation: 95 %
PH ART: 7.342 — AB (ref 7.350–7.450)
TCO2: 16 mmol/L (ref 0–100)
pCO2 arterial: 27.5 mmHg — ABNORMAL LOW (ref 32.0–48.0)
pO2, Arterial: 78 mmHg — ABNORMAL LOW (ref 83.0–108.0)

## 2015-11-15 LAB — URINALYSIS, ROUTINE W REFLEX MICROSCOPIC
Bilirubin Urine: NEGATIVE
Glucose, UA: >1000 mg/dL — AB
KETONES UR: 15 mg/dL — AB
LEUKOCYTES UA: NEGATIVE
NITRITE: NEGATIVE
PH: 6 (ref 5.0–8.0)
Specific Gravity, Urine: 1.022 (ref 1.005–1.030)

## 2015-11-15 LAB — URINE MICROSCOPIC-ADD ON: WBC UA: NONE SEEN WBC/hpf (ref 0–5)

## 2015-11-15 LAB — APTT: APTT: 33 s (ref 24–36)

## 2015-11-15 LAB — I-STAT TROPONIN, ED: Troponin i, poc: 0.33 ng/mL (ref 0.00–0.08)

## 2015-11-15 LAB — TROPONIN I: TROPONIN I: 0.89 ng/mL — AB (ref <0.03)

## 2015-11-15 LAB — PROTIME-INR
INR: 1.36
Prothrombin Time: 16.9 seconds — ABNORMAL HIGH (ref 11.4–15.2)

## 2015-11-15 MED ORDER — LORAZEPAM 2 MG/ML IJ SOLN
2.0000 mg | Freq: Once | INTRAMUSCULAR | Status: AC
  Administered 2015-11-15: 2 mg via INTRAVENOUS
  Filled 2015-11-15: qty 1

## 2015-11-15 MED ORDER — SODIUM CHLORIDE 0.9 % IV SOLN
INTRAVENOUS | Status: DC
  Administered 2015-11-15: 21:00:00 via INTRAVENOUS

## 2015-11-15 MED ORDER — SODIUM CHLORIDE 0.9 % IV BOLUS (SEPSIS)
1000.0000 mL | Freq: Once | INTRAVENOUS | Status: AC
Start: 2015-11-15 — End: 2015-11-15
  Administered 2015-11-15: 1000 mL via INTRAVENOUS

## 2015-11-15 MED ORDER — DEXTROSE-NACL 5-0.45 % IV SOLN: INTRAVENOUS | Status: DC

## 2015-11-15 MED ORDER — SODIUM CHLORIDE 0.9 % IV BOLUS (SEPSIS)
1000.0000 mL | Freq: Once | INTRAVENOUS | Status: AC
  Administered 2015-11-15: 1000 mL via INTRAVENOUS

## 2015-11-15 MED ORDER — SODIUM CHLORIDE 0.9 % IV SOLN
INTRAVENOUS | Status: DC
  Administered 2015-11-15: 5.4 [IU]/h via INTRAVENOUS
  Filled 2015-11-15: qty 2.5

## 2015-11-15 NOTE — ED Notes (Signed)
Family leaving for dinner, will be back shortly

## 2015-11-15 NOTE — H&P (Signed)
History and Physical    Dillon Thomas [id number] DOB: Aug 09, 1950 DOA: 11/15/2015  PCP: [name], MD  PCP and neurologist in the [facility] system.  Records available under Care Everywhere.  Patient coming from: Home  Chief Complaint: Found down  HPI: Dillon Thomas is a 65 y.o. gentleman with a history of uncontrolled type 2 DM, CKD 3 (baseline creatinine around 2), peripheral neuropathy, prior TIA, GERD, and hypothyroidism who has had progressive gait instability ("shuffling gait" per his significant other), back pain, and speech difficulties (referred to neurology at [facility] in the summer; he has seen speech therapy as an outpatient; last MRI in the [place] system was in 2015) who was last known to be in his baseline state of health around 11:30AM this morning.  Presently, he is obtunded and unable to engage in any type of effective communication.  This history of taken from his significant other, ED documentation, and outside medical records.  The patient's significant other found him down at home around 5PM. The patient was making sounds with his mouth but could not speak.  He was incontinent of urine.  He was staring into space.  She immediately called 911.  ED Course: Of note, the patient received 2mg  of IV ativan around 6:30PM because he was agitated prior to going to CT scan.  Head CT negative for acute CVA.  Chest xray negative for acute process.  Blood sugar greater than 800 on initial BMP with AKI (creatinine 3.5), pseudohyponatremia, and bicarb level of 14.  U/A does not appear infected but glucose, protein, and ketones present, as expected.  Hemoglobin also 10, which is down slightly from outpatient labs earlier this year in the [facility] system.  Diagnosis is DKA/HHNK.  The patient has received 2L of NS and has been started on an insulin infusion per protocol.  However, there is also concern for acute CVA; he will need an MRI.  Hospitalist asked to admit.  Patient is hypertensive with  systolic blood pressures [id number].  He remains tachycardic with HR in the 120's.  He has a troponin of 0.33.  Review of Systems: Unable to obtain due to patient's mental status changes.   Past Medical History:  Diagnosis Date  . BPH (benign prostatic hyperplasia)   . Chronic kidney disease   . Depression   . Diabetes mellitus without complication ([place])   . GERD (gastroesophageal reflux disease)   . Hyperlipidemia   . Hypertension   . Hypothyroidism   . TIA (transient ischemic attack)     Past Surgical History:  Procedure Laterality Date  . APPENDECTOMY    . CHOLECYSTECTOMY    . CYSTOURETHROSCOPY    . POLYPECTOMY     Vocal cord polyp removed  . VASECTOMY       reports that he quit smoking about 3 years ago. He has never used smokeless tobacco. He reports that he does not drink alcohol or use drugs.  He has two biological children.  He has a significant other who ha identified herself as his POA.  Allergies  Allergen Reactions  . Penicillins   . Tetracyclines & Related     Family History  Problem Relation Age of Onset  . CAD Mother   . Diabetes Mother   . Hyperlipidemia Mother   . Hypertension Mother   . CVA Mother   . Alcohol abuse Father   . Diabetes Father   . Hyperlipidemia Father      Prior to Admission medications   Medication  Sig Start Date End Date Taking? Authorizing Provider  allopurinol (ZYLOPRIM) 100 MG tablet Take 100 mg by mouth daily.    Historical Provider, MD  buPROPion (WELLBUTRIN XL) 300 MG 24 hr tablet Take 300 mg by mouth daily.    Historical Provider, MD  fosinopril (MONOPRIL) 20 MG tablet Take 20 mg by mouth daily.    Historical Provider, MD  HYDROcodone-acetaminophen (NORCO/VICODIN) 5-325 MG tablet Take 1 tablet by mouth every 6 (six) hours as needed for moderate pain.    Historical Provider, MD  insulin lispro protamine-lispro (HUMALOG 75/25 MIX) (75-25) 100 UNIT/ML SUSP injection Inject into the skin.    Historical Provider, MD    levothyroxine (SYNTHROID, LEVOTHROID) 25 MCG tablet Take 25 mcg by mouth daily before breakfast.    Historical Provider, MD  linagliptin (TRADJENTA) 5 MG TABS tablet Take 5 mg by mouth daily.    Historical Provider, MD  metoprolol succinate (TOPROL-XL) 25 MG 24 hr tablet Take 25 mg by mouth daily.    Historical Provider, MD  ranitidine (ZANTAC) 150 MG tablet Take 150 mg by mouth 2 (two) times daily.    Historical Provider, MD  rosuvastatin (CRESTOR) 10 MG tablet Take 10 mg by mouth daily.    Historical Provider, MD    Physical Exam: Vitals:   11/15/15 2130 11/15/15 2145 11/15/15 2200 11/15/15 2230  BP: 198/96 (!) 205/94 195/97 200/90  Pulse: (!) 123 (!) 124 (!) 125 (!) 126  Resp: 22 22 21 21   Temp:      TempSrc:      SpO2: 100% 100% 99% 99%  Weight:          Constitutional: acutely ill appearing, obtunded but not decompensating at present Vitals:   11/15/15 2130 11/15/15 2145 11/15/15 2200 11/15/15 2230  BP: 198/96 (!) 205/94 195/97 200/90  Pulse: (!) 123 (!) 124 (!) 125 (!) 126  Resp: 22 22 21 21   Temp:      TempSrc:      SpO2: 100% 100% 99% 99%  Weight:       Eyes: + nystagmus but pupils react bilaterally, lids and conjunctivae normal ENMT: Mucous membranes are dry and there are dark, dried secretions present on his tongue and around his teeth.   Neck: normal appearance, supple Respiratory: clear to auscultation listening anteriorly.  No wheezing, no crackles. Normal respiratory effort. No accessory muscle use.  Cardiovascular: Tachycardic but regular.  Bilateral pretibial edema.  2+ pedal pulses.   GI: abdomen is soft and compressible.  No distention.  Bowel sounds are present. Musculoskeletal:  No joint deformity in upper and lower extremities. No contractures. Normal muscle tone on the left; he is flaccid on there right. Skin: no rashes, warm and dry Neurologic: Compromised due to mental status changes but he appears to have right sided weakness.  He will show some  resistance with his left sided extremities, but his right sided extremities, particularly his arm, appear flaccid. Psychiatric: Obtunded and impaired.  No meaningful communication at this point.    Labs on Admission: I have personally reviewed following labs and imaging studies  CBC:  Recent Labs Lab 11/15/15 1830 11/15/15 1841  WBC 13.7*  --   NEUTROABS 11.8*  --   HGB 9.0* 9.9*  HCT 31.1* 29.0*  MCV 94.8  --   PLT 399  --    Basic Metabolic Panel:  Recent Labs Lab 11/15/15 1830 11/15/15 1841  NA 127* 131*  K 3.9 4.0  CL 99* 99*  CO2 14*  --  GLUCOSE 870* >700*  BUN 41* 40*  CREATININE 3.48* 3.30*  CALCIUM 8.4*  --    GFR: CrCl cannot be calculated (Unknown ideal weight.). Liver Function Tests:  Recent Labs Lab 11/15/15 1830  AST 23  ALT 14*  ALKPHOS 94  BILITOT 0.6  PROT 7.2  ALBUMIN 1.9*    Recent Labs Lab 11/15/15 1830  AMMONIA 19   Coagulation Profile:  Recent Labs Lab 11/15/15 1830  INR 1.36   CBG:  Recent Labs Lab 11/15/15 1846 11/15/15 2021 11/15/15 2134 11/15/15 2236  GLUCAP >600* >600* >600* 527*   Urine analysis:    Component Value Date/Time   COLORURINE YELLOW 11/15/2015 2042   APPEARANCEUR TURBID (A) 11/15/2015 2042   APPEARANCEUR Clear 01/09/2013 1858   LABSPEC 1.022 11/15/2015 2042   LABSPEC 1.015 01/09/2013 1858   PHURINE 6.0 11/15/2015 2042   GLUCOSEU >1000 (A) 11/15/2015 2042   GLUCOSEU >=500 01/09/2013 1858   HGBUR LARGE (A) 11/15/2015 2042   BILIRUBINUR NEGATIVE 11/15/2015 2042   BILIRUBINUR Negative 01/09/2013 1858   KETONESUR 15 (A) 11/15/2015 2042   PROTEINUR >300 (A) 11/15/2015 2042   NITRITE NEGATIVE 11/15/2015 2042   LEUKOCYTESUR NEGATIVE 11/15/2015 2042   LEUKOCYTESUR Negative 01/09/2013 1858    Radiological Exams on Admission: Dg Chest Portable 1 View  Result Date: 11/15/2015 CLINICAL DATA:  Altered mental status.  The patient was found down. EXAM: PORTABLE CHEST 1 VIEW COMPARISON:   01/09/2013 FINDINGS: The heart size and mediastinal contours are within normal limits. Both lungs are clear. The visualized skeletal structures are unremarkable. IMPRESSION: Normal exam. Electronically Signed   By: Lorriane Shire M.D.   On: 11/15/2015 19:11   Ct Head Code Stroke Wo Contrast`  Result Date: 11/15/2015 CLINICAL DATA:  Code stroke. Altered mental status. Not using RIGHT side. EXAM: CT HEAD WITHOUT CONTRAST TECHNIQUE: Contiguous axial images were obtained from the base of the skull through the vertex without intravenous contrast. COMPARISON:  CT head 01/09/2013. FINDINGS: Brain: Advanced atrophy with chronic microvascular ischemic change. No visible hemorrhage, mass lesion, hydrocephalus, or extra-axial fluid. Vascular: No hyperdense vessel or parenchymal calcification. Advanced vascular calcification in the carotid siphon and distal vertebral arteries. No features suggestive of large vessel occlusion. Chronic periventricular deep white matter infarct, RIGHT parietal lobe. Skull: Normal. Negative for fracture or focal lesion. Sinuses/Orbits: No acute finding. Other: None ASPECTS ([place] Stroke Program Early CT Score) - Ganglionic level infarction (caudate, lentiform nuclei, internal capsule, insula, M1-M3 cortex): 7 - Supraganglionic infarction (M4-M6 cortex): 3 Total score (0-10 with 10 being normal): 10 IMPRESSION: 1. Atrophy and small vessel disease. No acute intracranial findings are evident. 2. ASPECTS is 10. These results were called by telephone at the time of interpretation on 11/15/2015 at 7:25 pm to Dr. Leonard Schwartz , who verbally acknowledged these results. Electronically Signed   By: Staci Righter M.D.   On: 11/15/2015 19:30    EKG: Independently reviewed. Sinus tachycardia with repolarization abnormality.  Assessment/Plan Principal Problem:   Acute metabolic encephalopathy Active Problems:   AKI (acute kidney injury) ([place])   CKD (chronic kidney disease) stage 3, GFR 30-59  ml/min   Anemia   Hyponatremia   Hyperosmolar (nonketotic) coma ([facility])   Elevated troponin   Sinus tachycardia   Accelerated hypertension      Acute metabolic encephalopathy with possible seizure activity at home secondary to DKA/HHNK, though acute CVA is also on the differential. --Admit to the stepdown unit with continuous cardiac and pulse oximetry monitoring. --IV insulin infusion per  protocol --NS at 125cc/hr until blood sugar less than 250, then will add dextrose to the fluids until acidosis is corrected --pH 7.34, which is acceptable for now, patient appears to be protecting his airway for now.  Low threshold for PCCM consult if he deteriorates. --STAT MRI pending; will need neurology consult and stroke protocol if positive --NPO status for now --Holding all oral medications --Seizure precautions; consider EEG in the AM if mental status is not improved.  Elevated troponin of unclear clinical significance.  No known cardiac history at this point but he certainly has cardiac risk factors. --Serial troponin --Complete echo in the AM --Check BNP --Hold on anticoagulants for now (awaiting MRI and watching hemoglobin)  Mild acute on chronic anemia, appears to have dried blood in mouth.  No witnessed vomiting but he has a history of GERD.  I suspect that this could be related to mouth injury if he indeed had an unwitnessed seizure at home. --Monitor hemoglobin --No indication for transfusion now --Stool heme occult --IV protonix 40mg  q12h for now  Accelerated HTN --Management to be determined based on results of MRI.  Avoiding aggressive correction until neuroimaging is back.  If no stroke, will initiate IV anti-hypertensive.  If MRI positive, will discuss with neurology.  History of hypothyroidism --IV levothyroxine (half of oral dose) for now  AKI on CKD 3 --Aggressive hydration --Foley catheter in place, strict I/O --Repeat levels in the AM, he does not appear to have  UTI --Will order renal ultrasound now --May need nephrology consult if no improvement over the next 24 hours  Pseudohyponatremia secondary to marked hyperglycemia --Expect correct as blood sugar improves  DVT prophylaxis: SCDs Code Status: FULL Family Communication: Significant other, who is also POA, present at bedside in the ED at time of admission. Disposition Plan: To be determined. Consults called: NONE Admission status: Inpatient, telemetry, stepdown unit, may need ICU status.  The patient is critically ill with multi system organ dysfunction.  Mortality risk is high without inpatient care.  Expect he will be here longer than two days.   TIME SPENT: 90 minutes   Eber Jones MD [facility] Pager [phone]  If 7PM-7AM, please contact night-coverage [email or web address] Password [email or web address]  11/15/2015, 10:48 PM

## 2015-11-15 NOTE — ED Triage Notes (Signed)
Pt arrives from home via [facility].  EMS reports pt found on floor, not using R side, incontinent of urine.  EMS reports CBG>600.  Pt AOx0.

## 2015-11-15 NOTE — ED Notes (Signed)
CBG is 527.

## 2015-11-15 NOTE — ED Provider Notes (Signed)
[facility] DEPT Provider Note   CSN: [id number] Arrival date & time: 11/15/15  1816     History   Chief Complaint Chief Complaint  Patient presents with  . Altered Mental Status  . Hyperglycemia    HPI Dillon Thomas is a 65 y.o. male.  Patient presents with acute onset of altered mental status, last seen normal by his girlfriend at 11:30 this morning. She left briefly and found him several hours later on the ground, making incomprehensible sounds and thought to be not using his right side. He is unable to give history due to mental status. Girlfriend reports he has been getting progressively weaker over the past two weeks and complaining of back pain. Had a prior similar episode around a month ago that was due to hyperglycemia.    The history is provided by the EMS personnel and the spouse. The history is limited by the condition of the patient. No language interpreter was used.  Altered Mental Status   This is a new problem. The current episode started 3 to 5 hours ago (11:30 AM). The problem has been gradually worsening. Associated symptoms include confusion, weakness and agitation. Pertinent negatives include no seizures. Risk factors include the patient not taking medications correctly. His past medical history is significant for diabetes, liver disease and dementia. His past medical history does not include head trauma.    Past Medical History:  Diagnosis Date  . BPH (benign prostatic hyperplasia)   . Chronic kidney disease   . Depression   . Diabetes mellitus without complication ([place])   . GERD (gastroesophageal reflux disease)   . Hyperlipidemia   . Hypertension   . Hypothyroidism   . TIA (transient ischemic attack)     Patient Active Problem List   Diagnosis Date Noted  . Altered mental status 11/15/2015  . Acute metabolic encephalopathy [id number]  . AKI (acute kidney injury) ([place]) 11/15/2015  . CKD (chronic kidney disease) stage 3, GFR 30-59 ml/min  11/15/2015  . Anemia 11/15/2015  . Hyponatremia 11/15/2015  . Hyperosmolar (nonketotic) coma ([place]) 11/15/2015  . Elevated troponin 11/15/2015  . Sinus tachycardia 11/15/2015  . Accelerated hypertension 11/15/2015    Past Surgical History:  Procedure Laterality Date  . APPENDECTOMY    . CHOLECYSTECTOMY    . CYSTOURETHROSCOPY    . POLYPECTOMY     Vocal cord polyp removed  . VASECTOMY         Home Medications    Prior to Admission medications   Medication Sig Start Date End Date Taking? Authorizing Provider  allopurinol (ZYLOPRIM) 100 MG tablet Take 100 mg by mouth daily.    Historical Provider, MD  buPROPion (WELLBUTRIN XL) 300 MG 24 hr tablet Take 300 mg by mouth daily.    Historical Provider, MD  fosinopril (MONOPRIL) 20 MG tablet Take 20 mg by mouth daily.    Historical Provider, MD  HYDROcodone-acetaminophen (NORCO/VICODIN) 5-325 MG tablet Take 1 tablet by mouth every 6 (six) hours as needed for moderate pain.    Historical Provider, MD  insulin lispro protamine-lispro (HUMALOG 75/25 MIX) (75-25) 100 UNIT/ML SUSP injection Inject into the skin.    Historical Provider, MD  levothyroxine (SYNTHROID, LEVOTHROID) 25 MCG tablet Take 25 mcg by mouth daily before breakfast.    Historical Provider, MD  linagliptin (TRADJENTA) 5 MG TABS tablet Take 5 mg by mouth daily.    Historical Provider, MD  metoprolol succinate (TOPROL-XL) 25 MG 24 hr tablet Take 25 mg by mouth daily.  Historical Provider, MD  ranitidine (ZANTAC) 150 MG tablet Take 150 mg by mouth 2 (two) times daily.    Historical Provider, MD  rosuvastatin (CRESTOR) 10 MG tablet Take 10 mg by mouth daily.    Historical Provider, MD    Family History Family History  Problem Relation Age of Onset  . CAD Mother   . Diabetes Mother   . Hyperlipidemia Mother   . Hypertension Mother   . CVA Mother   . Alcohol abuse Father   . Diabetes Father   . Hyperlipidemia Father     Social History Social History  Substance Use  Topics  . Smoking status: Former Smoker    Quit date: 03/31/2012  . Smokeless tobacco: Never Used  . Alcohol use No     Allergies   Penicillins and Tetracyclines & related   Review of Systems Review of Systems  Constitutional: Negative for fever.  HENT: Positive for congestion.   Respiratory: Negative for cough and shortness of breath.   Cardiovascular: Negative for chest pain.  Gastrointestinal: Negative for abdominal pain.  Genitourinary: Negative.   Musculoskeletal: Positive for back pain.  Skin: Negative.   Neurological: Positive for weakness. Negative for seizures.  Psychiatric/Behavioral: Positive for agitation and confusion.     Physical Exam Updated Vital Signs BP 200/90   Pulse (!) 126   Temp 98.9 F (37.2 C) (Axillary)   Resp 21   Wt 99.8 kg   SpO2 99%   Physical Exam  Constitutional: He appears well-developed and well-nourished. He is uncooperative. He appears ill.  HENT:  Head: Normocephalic and atraumatic.  Eyes:  Pupils 3 to 2 mm and reactive bilaterally. Will not blink to threat on right side. Does not track to voice.  Neck: Trachea normal and phonation normal. Neck supple.  Cardiovascular: Regular rhythm, S1 normal, S2 normal, normal heart sounds and intact distal pulses.  Tachycardia present.   Pulmonary/Chest: Effort normal and breath sounds normal. No stridor. No respiratory distress.  Abdominal: Soft. There is no tenderness. There is no rigidity and no guarding.  Musculoskeletal:  Holding right arm up and contracted. Red mark to midline of his lumbar spine, blanching erythematous patch.  Neurological: He is alert. GCS eye subscore is 4. GCS verbal subscore is 2. GCS motor subscore is 5.  Alert, does not follow commands or track to voice. Holding right arm contracted though was noted to move it spontaneously. No ankle clonus, normal reflexes bilaterally.      ED Treatments / Results  Labs (all labs ordered are listed, but only abnormal results  are displayed) Labs Reviewed  PROTIME-INR - Abnormal; Notable for the following:       Result Value   Prothrombin Time 16.9 (*)    All other components within normal limits  CBC - Abnormal; Notable for the following:    WBC 13.7 (*)    RBC 3.28 (*)    Hemoglobin 9.0 (*)    HCT 31.1 (*)    MCHC 28.9 (*)    All other components within normal limits  DIFFERENTIAL - Abnormal; Notable for the following:    Neutro Abs 11.8 (*)    Lymphs Abs 0.5 (*)    Monocytes Absolute 1.4 (*)    All other components within normal limits  COMPREHENSIVE METABOLIC PANEL - Abnormal; Notable for the following:    Sodium 127 (*)    Chloride 99 (*)    CO2 14 (*)    Glucose, Bld 870 (*)    BUN  41 (*)    Creatinine, Ser 3.48 (*)    Calcium 8.4 (*)    Albumin 1.9 (*)    ALT 14 (*)    GFR calc non Af Amer 17 (*)    GFR calc Af Amer 20 (*)    All other components within normal limits  URINALYSIS, ROUTINE W REFLEX MICROSCOPIC (NOT AT [facility]) - Abnormal; Notable for the following:    APPearance TURBID (*)    Glucose, UA >1000 (*)    Hgb urine dipstick LARGE (*)    Ketones, ur 15 (*)    Protein, ur >300 (*)    All other components within normal limits  URINE MICROSCOPIC-ADD ON - Abnormal; Notable for the following:    Squamous Epithelial / LPF 0-5 (*)    Bacteria, UA RARE (*)    All other components within normal limits  TROPONIN I - Abnormal; Notable for the following:    Troponin I 0.89 (*)    All other components within normal limits  BRAIN NATRIURETIC PEPTIDE - Abnormal; Notable for the following:    B Natriuretic Peptide 597.3 (*)    All other components within normal limits  CBC WITH DIFFERENTIAL/PLATELET - Abnormal; Notable for the following:    RBC 3.07 (*)    Hemoglobin 8.5 (*)    HCT 26.2 (*)    All other components within normal limits  COMPREHENSIVE METABOLIC PANEL - Abnormal; Notable for the following:    Sodium 134 (*)    CO2 16 (*)    Glucose, Bld 552 (*)    BUN 38 (*)     Creatinine, Ser 3.28 (*)    Calcium 8.2 (*)    Albumin 1.8 (*)    ALT 14 (*)    GFR calc non Af Amer 18 (*)    GFR calc Af Amer 21 (*)    All other components within normal limits  I-STAT TROPOININ, ED - Abnormal; Notable for the following:    Troponin i, poc 0.33 (*)    All other components within normal limits  CBG MONITORING, ED - Abnormal; Notable for the following:    Glucose-Capillary >600 (*)    All other components within normal limits  I-STAT CHEM 8, ED - Abnormal; Notable for the following:    Sodium 131 (*)    Chloride 99 (*)    BUN 40 (*)    Creatinine, Ser 3.30 (*)    Glucose, Bld >700 (*)    Calcium, Ion 1.11 (*)    Hemoglobin 9.9 (*)    HCT 29.0 (*)    All other components within normal limits  CBG MONITORING, ED - Abnormal; Notable for the following:    Glucose-Capillary >600 (*)    All other components within normal limits  CBG MONITORING, ED - Abnormal; Notable for the following:    Glucose-Capillary >600 (*)    All other components within normal limits  CBG MONITORING, ED - Abnormal; Notable for the following:    Glucose-Capillary 527 (*)    All other components within normal limits  CBG MONITORING, ED - Abnormal; Notable for the following:    Glucose-Capillary 575 (*)    All other components within normal limits  I-STAT ARTERIAL BLOOD GAS, ED - Abnormal; Notable for the following:    pH, Arterial 7.342 (*)    pCO2 arterial 27.5 (*)    pO2, Arterial 78.0 (*)    Bicarbonate 14.9 (*)    Acid-base deficit 10.0 (*)    All  other components within normal limits  APTT  AMMONIA  RAPID URINE DRUG SCREEN, HOSP PERFORMED  BLOOD GAS, ARTERIAL  CBG MONITORING, ED  CBG MONITORING, ED    EKG  EKG Interpretation  Date/Time:  Sunday November 15 2015 18:32:42 EDT Ventricular Rate:  128 PR Interval:    QRS Duration: 108 QT Interval:  326 QTC Calculation: 474 R Axis:   -34 Text Interpretation:  Sinus tachycardia Prolonged PR interval Left atrial enlargement  Left axis deviation Anterior infarct, old Nonspecific repol abnormality, diffuse leads Abnormal ekg Confirmed by Audie Pinto  MD, ROBERT ([id number]) on 11/15/2015 8:22:02 PM       Radiology Dg Chest Portable 1 View  Result Date: 11/15/2015 CLINICAL DATA:  Altered mental status.  The patient was found down. EXAM: PORTABLE CHEST 1 VIEW COMPARISON:  01/09/2013 FINDINGS: The heart size and mediastinal contours are within normal limits. Both lungs are clear. The visualized skeletal structures are unremarkable. IMPRESSION: Normal exam. Electronically Signed   By: Lorriane Shire M.D.   On: 11/15/2015 19:11   Ct Head Code Stroke Wo Contrast`  Result Date: 11/15/2015 CLINICAL DATA:  Code stroke. Altered mental status. Not using RIGHT side. EXAM: CT HEAD WITHOUT CONTRAST TECHNIQUE: Contiguous axial images were obtained from the base of the skull through the vertex without intravenous contrast. COMPARISON:  CT head 01/09/2013. FINDINGS: Brain: Advanced atrophy with chronic microvascular ischemic change. No visible hemorrhage, mass lesion, hydrocephalus, or extra-axial fluid. Vascular: No hyperdense vessel or parenchymal calcification. Advanced vascular calcification in the carotid siphon and distal vertebral arteries. No features suggestive of large vessel occlusion. Chronic periventricular deep white matter infarct, RIGHT parietal lobe. Skull: Normal. Negative for fracture or focal lesion. Sinuses/Orbits: No acute finding. Other: None ASPECTS ([place] Stroke Program Early CT Score) - Ganglionic level infarction (caudate, lentiform nuclei, internal capsule, insula, M1-M3 cortex): 7 - Supraganglionic infarction (M4-M6 cortex): 3 Total score (0-10 with 10 being normal): 10 IMPRESSION: 1. Atrophy and small vessel disease. No acute intracranial findings are evident. 2. ASPECTS is 10. These results were called by telephone at the time of interpretation on 11/15/2015 at 7:25 pm to Dr. Leonard Schwartz , who verbally acknowledged  these results. Electronically Signed   By: Staci Righter M.D.   On: 11/15/2015 19:30    Procedures Procedures (including critical care time)  Medications Ordered in ED Medications  insulin regular (NOVOLIN R,HUMULIN R) 250 Units in sodium chloride 0.9 % 250 mL (1 Units/mL) infusion (10.3 Units/hr Intravenous Rate/Dose Change 11/16/15 0005)  0.9 %  sodium chloride infusion ( Intravenous New Bag/Given 11/15/15 2038)  dextrose 5 %-0.45 % sodium chloride infusion (not administered)  LORazepam (ATIVAN) injection 2 mg (2 mg Intravenous Given 11/15/15 1851)  sodium chloride 0.9 % bolus 1,000 mL (0 mLs Intravenous Stopped 11/15/15 2038)  sodium chloride 0.9 % bolus 1,000 mL (0 mLs Intravenous Stopped 11/15/15 2038)  sodium chloride 0.9 % bolus 1,000 mL (1,000 mLs Intravenous New Bag/Given 11/15/15 2319)     Initial Impression / Assessment and Plan / ED Course  I have reviewed the triage vital signs and the nursing notes.  Pertinent labs & imaging results that were available during my care of the patient were reviewed by me and considered in my medical decision making (see chart for details).  Clinical Course    Patient presents with altered mental status since today, last seen normal by his girlfriend at 11:30 AM, over 6 hours prior to arrival in the Ed. He is a GCS of 9-10 on  arrival, not following commands but opening his eyes spontaneously and withdrawing to pain in all extremities. He is tachycardic and hypertensive. CT code stroke series negative for hemorrhagic stroke, is outside the window for TPA at this point for embolic stroke. Have high suspicion for stroke given his presentation and decreased movement of his right side. Blood sugar notably was 800 which is concerning for HHS. Was given 2L of fluid resuscitation and then started on an insulin drip with hourly glucose measurements. Wife reports generalized weakness over the last two days but no signs of any infection. Mild leukocytosis is  likely reactive in the setting of obtundation and multiple other derangements (troponinemia, AKI). CXR and UA were negative for infection. Patient will be admitted to the step-down unit for further management.  Final Clinical Impressions(s) / ED Diagnoses   Final diagnoses:  Obtunded  Hyperglycemia  AKI (acute kidney injury) ([place])  Tachycardia    New Prescriptions New Prescriptions   No medications on file     Harlin Heys, MD 11/16/15 [id number]    Leonard Schwartz, MD 12/16/15 [phone]

## 2015-11-15 NOTE — ED Notes (Signed)
Notified MD of vitals. 

## 2015-11-16 ENCOUNTER — Inpatient Hospital Stay (HOSPITAL_COMMUNITY): Payer: MEDICARE

## 2015-11-16 ENCOUNTER — Other Ambulatory Visit (HOSPITAL_COMMUNITY): Payer: PRIVATE HEALTH INSURANCE

## 2015-11-16 DIAGNOSIS — R000 Tachycardia, unspecified: Secondary | ICD-10-CM

## 2015-11-16 DIAGNOSIS — N179 Acute kidney failure, unspecified: Secondary | ICD-10-CM

## 2015-11-16 DIAGNOSIS — L89152 Pressure ulcer of sacral region, stage 2: Secondary | ICD-10-CM | POA: Insufficient documentation

## 2015-11-16 DIAGNOSIS — R401 Stupor: Secondary | ICD-10-CM

## 2015-11-16 DIAGNOSIS — R531 Weakness: Secondary | ICD-10-CM

## 2015-11-16 DIAGNOSIS — G9341 Metabolic encephalopathy: Secondary | ICD-10-CM

## 2015-11-16 LAB — GLUCOSE, CAPILLARY
GLUCOSE-CAPILLARY: 202 mg/dL — AB (ref 65–99)
GLUCOSE-CAPILLARY: 183 mg/dL — AB (ref 65–99)
GLUCOSE-CAPILLARY: 160 mg/dL — AB (ref 65–99)
GLUCOSE-CAPILLARY: 152 mg/dL — AB (ref 65–99)
GLUCOSE-CAPILLARY: 140 mg/dL — AB (ref 65–99)
GLUCOSE-CAPILLARY: 178 mg/dL — AB (ref 65–99)
Glucose-Capillary: 187 mg/dL — ABNORMAL HIGH (ref 65–99)
Glucose-Capillary: 144 mg/dL — ABNORMAL HIGH (ref 65–99)
Glucose-Capillary: 113 mg/dL — ABNORMAL HIGH (ref 65–99)
Glucose-Capillary: 105 mg/dL — ABNORMAL HIGH (ref 65–99)
Glucose-Capillary: 160 mg/dL — ABNORMAL HIGH (ref 65–99)

## 2015-11-16 LAB — BASIC METABOLIC PANEL
ANION GAP: 9 (ref 5–15)
ANION GAP: 7 (ref 5–15)
ANION GAP: 7 (ref 5–15)
Anion gap: 7 (ref 5–15)
BUN: 37 mg/dL — ABNORMAL HIGH (ref 6–20)
BUN: 38 mg/dL — ABNORMAL HIGH (ref 6–20)
BUN: 37 mg/dL — AB (ref 6–20)
BUN: 37 mg/dL — AB (ref 6–20)
CHLORIDE: 113 mmol/L — AB (ref 101–111)
CHLORIDE: 114 mmol/L — AB (ref 101–111)
CO2: 15 mmol/L — ABNORMAL LOW (ref 22–32)
CO2: 18 mmol/L — ABNORMAL LOW (ref 22–32)
CO2: 17 mmol/L — AB (ref 22–32)
CO2: 16 mmol/L — ABNORMAL LOW (ref 22–32)
CREATININE: 3.04 mg/dL — AB (ref 0.61–1.24)
Calcium: 8.1 mg/dL — ABNORMAL LOW (ref 8.9–10.3)
Calcium: 8.1 mg/dL — ABNORMAL LOW (ref 8.9–10.3)
Calcium: 8.2 mg/dL — ABNORMAL LOW (ref 8.9–10.3)
Calcium: 8.3 mg/dL — ABNORMAL LOW (ref 8.9–10.3)
Chloride: 113 mmol/L — ABNORMAL HIGH (ref 101–111)
Chloride: 114 mmol/L — ABNORMAL HIGH (ref 101–111)
Creatinine, Ser: 3.19 mg/dL — ABNORMAL HIGH (ref 0.61–1.24)
Creatinine, Ser: 3.25 mg/dL — ABNORMAL HIGH (ref 0.61–1.24)
Creatinine, Ser: 3.14 mg/dL — ABNORMAL HIGH (ref 0.61–1.24)
GFR calc Af Amer: 22 mL/min — ABNORMAL LOW (ref >60)
GFR calc Af Amer: 21 mL/min — ABNORMAL LOW (ref >60)
GFR calc Af Amer: 23 mL/min — ABNORMAL LOW (ref >60)
GFR calc Af Amer: 22 mL/min — ABNORMAL LOW (ref >60)
GFR calc non Af Amer: 20 mL/min — ABNORMAL LOW (ref >60)
GFR, EST NON AFRICAN AMERICAN: 19 mL/min — AB (ref >60)
GFR, EST NON AFRICAN AMERICAN: 18 mL/min — AB (ref >60)
GFR, EST NON AFRICAN AMERICAN: 19 mL/min — AB (ref >60)
GLUCOSE: 183 mg/dL — AB (ref 65–99)
GLUCOSE: 154 mg/dL — AB (ref 65–99)
GLUCOSE: 104 mg/dL — AB (ref 65–99)
GLUCOSE: 169 mg/dL — AB (ref 65–99)
POTASSIUM: 3.1 mmol/L — AB (ref 3.5–5.1)
POTASSIUM: 3.7 mmol/L (ref 3.5–5.1)
POTASSIUM: 3.3 mmol/L — AB (ref 3.5–5.1)
POTASSIUM: 3.8 mmol/L (ref 3.5–5.1)
SODIUM: 139 mmol/L (ref 135–145)
SODIUM: 137 mmol/L (ref 135–145)
Sodium: 137 mmol/L (ref 135–145)
Sodium: 137 mmol/L (ref 135–145)

## 2015-11-16 LAB — COMPREHENSIVE METABOLIC PANEL
ALBUMIN: 1.8 g/dL — AB (ref 3.5–5.0)
ALT: 14 U/L — ABNORMAL LOW (ref 17–63)
ANION GAP: 10 (ref 5–15)
AST: 29 U/L (ref 15–41)
Alkaline Phosphatase: 82 U/L (ref 38–126)
BILIRUBIN TOTAL: 0.3 mg/dL (ref 0.3–1.2)
BUN: 38 mg/dL — ABNORMAL HIGH (ref 6–20)
CO2: 16 mmol/L — ABNORMAL LOW (ref 22–32)
Calcium: 8.2 mg/dL — ABNORMAL LOW (ref 8.9–10.3)
Chloride: 108 mmol/L (ref 101–111)
Creatinine, Ser: 3.28 mg/dL — ABNORMAL HIGH (ref 0.61–1.24)
GFR calc Af Amer: 21 mL/min — ABNORMAL LOW (ref >60)
GFR calc non Af Amer: 18 mL/min — ABNORMAL LOW (ref >60)
GLUCOSE: 552 mg/dL — AB (ref 65–99)
POTASSIUM: 3.7 mmol/L (ref 3.5–5.1)
SODIUM: 134 mmol/L — AB (ref 135–145)
TOTAL PROTEIN: 6.9 g/dL (ref 6.5–8.1)

## 2015-11-16 LAB — LIPID PANEL
Cholesterol: 129 mg/dL (ref 0–200)
HDL: 38 mg/dL — ABNORMAL LOW (ref >40)
LDL CALC: 73 mg/dL (ref 0–99)
TRIGLYCERIDES: 89 mg/dL (ref <150)
Total CHOL/HDL Ratio: 3.4 RATIO
VLDL: 18 mg/dL (ref 0–40)

## 2015-11-16 LAB — TYPE AND SCREEN
ABO/RH(D): A POS
Antibody Screen: NEGATIVE

## 2015-11-16 LAB — CBC WITH DIFFERENTIAL/PLATELET
Basophils Absolute: 0 10*3/uL (ref 0.0–0.1)
Basophils Relative: 0 %
EOS PCT: 0 %
Eosinophils Absolute: 0 10*3/uL (ref 0.0–0.7)
HEMATOCRIT: 26.2 % — AB (ref 39.0–52.0)
Hemoglobin: 8.5 g/dL — ABNORMAL LOW (ref 13.0–17.0)
LYMPHS ABS: 1.9 10*3/uL (ref 0.7–4.0)
Lymphocytes Relative: 19 %
MCH: 27.7 pg (ref 26.0–34.0)
MCHC: 32.4 g/dL (ref 30.0–36.0)
MCV: 85.3 fL (ref 78.0–100.0)
MONO ABS: 0.7 10*3/uL (ref 0.1–1.0)
MONOS PCT: 7 %
NEUTROS ABS: 7.3 10*3/uL (ref 1.7–7.7)
Neutrophils Relative %: 74 %
PLATELETS: 335 10*3/uL (ref 150–400)
RBC: 3.07 MIL/uL — AB (ref 4.22–5.81)
RDW: 13.8 % (ref 11.5–15.5)
WBC: 9.9 10*3/uL (ref 4.0–10.5)

## 2015-11-16 LAB — CBG MONITORING, ED: GLUCOSE-CAPILLARY: 343 mg/dL — AB (ref 65–99)

## 2015-11-16 LAB — PROCALCITONIN: Procalcitonin: 3.56 ng/mL

## 2015-11-16 LAB — HEMOGLOBIN AND HEMATOCRIT, BLOOD
HCT: 23.6 % — ABNORMAL LOW (ref 39.0–52.0)
HEMOGLOBIN: 7.7 g/dL — AB (ref 13.0–17.0)

## 2015-11-16 LAB — ABO/RH: ABO/RH(D): A POS

## 2015-11-16 LAB — TROPONIN I
TROPONIN I: 5.34 ng/mL — AB (ref <0.03)
TROPONIN I: 7.46 ng/mL — AB (ref <0.03)
Troponin I: 7.46 ng/mL (ref <0.03)

## 2015-11-16 LAB — TSH: TSH: 2.954 u[IU]/mL (ref 0.350–4.500)

## 2015-11-16 LAB — MRSA PCR SCREENING: MRSA by PCR: NEGATIVE

## 2015-11-16 LAB — T4, FREE: Free T4: 1.02 ng/dL (ref 0.61–1.12)

## 2015-11-16 MED ORDER — PANTOPRAZOLE SODIUM 40 MG IV SOLR
40.0000 mg | Freq: Two times a day (BID) | INTRAVENOUS | Status: DC
  Administered 2015-11-16 – 2015-11-18 (×6): 40 mg via INTRAVENOUS
  Filled 2015-11-16 (×6): qty 40

## 2015-11-16 MED ORDER — ONDANSETRON HCL 4 MG/2ML IJ SOLN
4.0000 mg | Freq: Four times a day (QID) | INTRAMUSCULAR | Status: DC | PRN
  Administered 2015-11-17: 4 mg via INTRAVENOUS
  Filled 2015-11-16: qty 2

## 2015-11-16 MED ORDER — POTASSIUM CHLORIDE 10 MEQ/100ML IV SOLN
10.0000 meq | INTRAVENOUS | Status: AC
  Administered 2015-11-16: 10 meq via INTRAVENOUS
  Filled 2015-11-16: qty 100

## 2015-11-16 MED ORDER — LEVOTHYROXINE SODIUM 100 MCG IV SOLR
12.5000 ug | Freq: Every day | INTRAVENOUS | Status: DC
  Administered 2015-11-16: 12.5 ug via INTRAVENOUS
  Filled 2015-11-16: qty 5

## 2015-11-16 MED ORDER — ORAL CARE MOUTH RINSE
15.0000 mL | Freq: Two times a day (BID) | OROMUCOSAL | Status: DC
  Administered 2015-11-16 – 2015-11-18 (×4): 15 mL via OROMUCOSAL

## 2015-11-16 MED ORDER — LABETALOL HCL 5 MG/ML IV SOLN
10.0000 mg | INTRAVENOUS | Status: DC | PRN
  Administered 2015-11-16: 10 mg via INTRAVENOUS
  Administered 2015-11-16: 20 mg via INTRAVENOUS
  Administered 2015-11-16: 10 mg via INTRAVENOUS
  Filled 2015-11-16 (×2): qty 4

## 2015-11-16 MED ORDER — TICAGRELOR 90 MG PO TABS
90.0000 mg | ORAL_TABLET | Freq: Two times a day (BID) | ORAL | Status: DC
  Administered 2015-11-16 – 2015-11-23 (×14): 90 mg via ORAL
  Filled 2015-11-16 (×15): qty 1

## 2015-11-16 MED ORDER — SODIUM CHLORIDE 0.45 % IV SOLN: INTRAVENOUS | Status: DC

## 2015-11-16 MED ORDER — SODIUM CHLORIDE 0.9 % IV SOLN: INTRAVENOUS | Status: DC

## 2015-11-16 MED ORDER — INSULIN ASPART 100 UNIT/ML ~~LOC~~ SOLN
0.0000 [IU] | SUBCUTANEOUS | Status: DC
  Administered 2015-11-16: 3 [IU] via SUBCUTANEOUS
  Administered 2015-11-16: 2 [IU] via SUBCUTANEOUS
  Administered 2015-11-16: 3 [IU] via SUBCUTANEOUS
  Administered 2015-11-17: 8 [IU] via SUBCUTANEOUS
  Administered 2015-11-17 (×2): 3 [IU] via SUBCUTANEOUS
  Administered 2015-11-17: 11 [IU] via SUBCUTANEOUS
  Administered 2015-11-17: 8 [IU] via SUBCUTANEOUS
  Administered 2015-11-17 – 2015-11-18 (×2): 3 [IU] via SUBCUTANEOUS
  Administered 2015-11-18: 5 [IU] via SUBCUTANEOUS
  Administered 2015-11-18 (×2): 11 [IU] via SUBCUTANEOUS

## 2015-11-16 MED ORDER — CHLORHEXIDINE GLUCONATE 0.12 % MT SOLN
15.0000 mL | Freq: Two times a day (BID) | OROMUCOSAL | Status: DC
  Administered 2015-11-16 – 2015-11-18 (×6): 15 mL via OROMUCOSAL
  Filled 2015-11-16 (×6): qty 15

## 2015-11-16 MED ORDER — LACTATED RINGERS IV SOLN
INTRAVENOUS | Status: DC
  Administered 2015-11-16 – 2015-11-17 (×4): via INTRAVENOUS

## 2015-11-16 MED ORDER — HEPARIN SODIUM (PORCINE) 5000 UNIT/ML IJ SOLN
5000.0000 [IU] | Freq: Three times a day (TID) | INTRAMUSCULAR | Status: DC
  Administered 2015-11-16 – 2015-11-22 (×19): 5000 [IU] via SUBCUTANEOUS
  Filled 2015-11-16 (×19): qty 1

## 2015-11-16 MED ORDER — POTASSIUM CHLORIDE 10 MEQ/100ML IV SOLN
10.0000 meq | INTRAVENOUS | Status: AC
  Administered 2015-11-16 (×4): 10 meq via INTRAVENOUS
  Filled 2015-11-16: qty 100

## 2015-11-16 MED ORDER — METOPROLOL TARTRATE 5 MG/5ML IV SOLN
2.5000 mg | Freq: Four times a day (QID) | INTRAVENOUS | Status: DC
  Administered 2015-11-16 (×2): 2.5 mg via INTRAVENOUS
  Filled 2015-11-16 (×2): qty 5

## 2015-11-16 MED ORDER — POTASSIUM CHLORIDE 10 MEQ/100ML IV SOLN
10.0000 meq | INTRAVENOUS | Status: AC
  Administered 2015-11-16 (×3): 10 meq via INTRAVENOUS
  Filled 2015-11-16: qty 100

## 2015-11-16 MED ORDER — METOPROLOL TARTRATE 25 MG PO TABS
25.0000 mg | ORAL_TABLET | Freq: Two times a day (BID) | ORAL | Status: DC
  Administered 2015-11-16: 25 mg via ORAL
  Filled 2015-11-16: qty 1

## 2015-11-16 MED ORDER — ASPIRIN 300 MG RE SUPP
300.0000 mg | Freq: Every day | RECTAL | Status: DC
  Administered 2015-11-16: 300 mg via RECTAL
  Filled 2015-11-16: qty 1

## 2015-11-16 MED ORDER — FOLIC ACID 5 MG/ML IJ SOLN
1.0000 mg | Freq: Every day | INTRAMUSCULAR | Status: DC
  Administered 2015-11-16: 1 mg via INTRAVENOUS
  Filled 2015-11-16 (×2): qty 0.2

## 2015-11-16 MED ORDER — DEXTROSE-NACL 5-0.45 % IV SOLN
INTRAVENOUS | Status: DC
  Administered 2015-11-16: 100 mL via INTRAVENOUS

## 2015-11-16 MED ORDER — SODIUM CHLORIDE 0.45 % IV SOLN
INTRAVENOUS | Status: DC
  Administered 2015-11-16: 07:00:00 via INTRAVENOUS
  Filled 2015-11-16 (×2): qty 900

## 2015-11-16 MED ORDER — THIAMINE HCL 100 MG/ML IJ SOLN
100.0000 mg | Freq: Every day | INTRAMUSCULAR | Status: DC
  Administered 2015-11-16: 100 mg via INTRAVENOUS
  Filled 2015-11-16: qty 2

## 2015-11-16 MED ORDER — HYDRALAZINE HCL 20 MG/ML IJ SOLN
10.0000 mg | INTRAMUSCULAR | Status: DC | PRN
  Administered 2015-11-16: 40 mg via INTRAVENOUS
  Administered 2015-11-16: 10 mg via INTRAVENOUS
  Administered 2015-11-17 (×2): 20 mg via INTRAVENOUS
  Administered 2015-11-18: 40 mg via INTRAVENOUS
  Filled 2015-11-16: qty 2
  Filled 2015-11-16: qty 1
  Filled 2015-11-16: qty 2
  Filled 2015-11-16 (×3): qty 1

## 2015-11-16 MED ORDER — ROSUVASTATIN CALCIUM 20 MG PO TABS
20.0000 mg | ORAL_TABLET | Freq: Every day | ORAL | Status: DC
  Administered 2015-11-16 – 2015-11-23 (×8): 20 mg via ORAL
  Filled 2015-11-16 (×8): qty 1

## 2015-11-16 MED ORDER — LABETALOL HCL 5 MG/ML IV SOLN
10.0000 mg | Freq: Once | INTRAVENOUS | Status: AC
  Administered 2015-11-16: 10 mg via INTRAVENOUS
  Filled 2015-11-16: qty 4

## 2015-11-16 MED ORDER — ACETAMINOPHEN 650 MG RE SUPP
650.0000 mg | RECTAL | Status: DC | PRN
  Administered 2015-11-16: 650 mg via RECTAL
  Filled 2015-11-16: qty 1

## 2015-11-16 NOTE — Progress Notes (Signed)
Patient was originally coming to [facility] [address] stepdown looking up information for report while patient was in the ED and Radiology departments.  Patient being transferred to a higher level of care.

## 2015-11-16 NOTE — Consult Note (Signed)
CARDIOLOGY CONSULT NOTE  Patient ID: Dillon Thomas MRN: [id number] DOB/AGE: May 22, 1950 65 y.o.  Admit date: 11/15/2015 Referring Physician  Joya Salm, MD Primary Physician:  [name], MD Reason for Consultation  NSTEMI  HPI: Dillon Thomas  is a 65 y.o. male  With Patient with known chronic renal insufficiency, stage III CKD, h/o TIA hypertension, hyperlipidemia, uncontrolled diabetes mellitus, admitted to the hospital with altered mental status. Until this morning he was not able to give any history. I was contacted because of elevated serum troponin.  Patient denied any chest pain or shortness of breath. He states that he does not recollect having had cardiac workup in the past. He states that he stopped taking insulin at home 2 days ago and could not give me the reasons why. He is able to converse with me and follow all my commands, only things he could not remember was his home address. He is being now managed for DKA. No prior history of cough, fever, chills prior to the episode that made him to be admitted to the hospital. His girlfriend called EMS when she found him in our treatment mental status and inability to converse.Overall history was limited.  Past Medical History:  Diagnosis Date  . BPH (benign prostatic hyperplasia)   . Chronic kidney disease   . Depression   . Diabetes mellitus without complication ([place])   . GERD (gastroesophageal reflux disease)   . Hyperlipidemia   . Hypertension   . Hypothyroidism   . TIA (transient ischemic attack)      Past Surgical History:  Procedure Laterality Date  . APPENDECTOMY    . CHOLECYSTECTOMY    . CYSTOURETHROSCOPY    . POLYPECTOMY     Vocal cord polyp removed  . VASECTOMY       Family History  Problem Relation Age of Onset  . CAD Mother   . Diabetes Mother   . Hyperlipidemia Mother   . Hypertension Mother   . CVA Mother   . Alcohol abuse Father   . Diabetes Father   . Hyperlipidemia Father       Social History: Social History   Social History  . Marital status: Single    Spouse name: N/A  . Number of children: N/A  . Years of education: N/A   Occupational History  . Not on file.   Social History Main Topics  . Smoking status: Former Smoker    Quit date: 03/31/2012  . Smokeless tobacco: Never Used  . Alcohol use No  . Drug use: No  . Sexual activity: Not on file   Other Topics Concern  . Not on file   Social History Narrative  . No narrative on file     Prescriptions Prior to Admission  Medication Sig Dispense Refill Last Dose  . allopurinol (ZYLOPRIM) 100 MG tablet Take 100 mg by mouth daily.     Marland Kitchen buPROPion (WELLBUTRIN XL) 300 MG 24 hr tablet Take 300 mg by mouth daily.     . fosinopril (MONOPRIL) 20 MG tablet Take 20 mg by mouth daily.     Marland Kitchen HYDROcodone-acetaminophen (NORCO/VICODIN) 5-325 MG tablet Take 1 tablet by mouth every 6 (six) hours as needed for moderate pain.     Marland Kitchen insulin lispro protamine-lispro (HUMALOG 75/25 MIX) (75-25) 100 UNIT/ML SUSP injection Inject into the skin.     Marland Kitchen levothyroxine (SYNTHROID, LEVOTHROID) 25 MCG tablet Take 25 mcg by mouth daily before breakfast.     . linagliptin (TRADJENTA) 5  MG TABS tablet Take 5 mg by mouth daily.     . metoprolol succinate (TOPROL-XL) 25 MG 24 hr tablet Take 25 mg by mouth daily.     . ranitidine (ZANTAC) 150 MG tablet Take 150 mg by mouth 2 (two) times daily.     . rosuvastatin (CRESTOR) 10 MG tablet Take 10 mg by mouth daily.      ROS: General: no fevers/chills/night sweats Eyes: no blurry vision, diplopia, or amaurosis ENT: no sore throat or hearing loss Resp: no cough, wheezing, or hemoptysis CV: no edema or palpitations GI: no abdominal pain, nausea, vomiting, diarrhea, or constipation GU: no dysuria, frequency, or hematuria Skin: no rash Neuro: No specific complaints per patient Musculoskeletal: no joint pain or swelling Heme: no bleeding, DVT, or easy bruising Endo: no polydipsia or  polyuria   Physical Exam: Blood pressure (!) 158/65, pulse 96, temperature 99.4 F (37.4 C), temperature source Axillary, resp. rate 19, height 6' (1.829 m), weight 99.8 kg (220 lb), SpO2 98 %.   General appearance: alert, cooperative, appears stated age, no distress and Hands Restrainted. Lungs: clear to auscultation bilaterally Heart: regular rate and rhythm, S1, S2 normal, no murmur, click, rub or gallop Extremities: extremities normal, atraumatic, no cyanosis or edema Pulses: 2+ and symmetric Neurologic: Grossly normal patient is able to move all 4 extremity  on command appropriately, complete exam not performed by me.  Labs:   Lab Results  Component Value Date   WBC 9.9 11/15/2015   HGB 8.5 (L) 11/15/2015   HCT 26.2 (L) 11/15/2015   MCV 85.3 11/15/2015   PLT 335 11/15/2015    Recent Labs Lab 11/15/15 2328  11/16/15 0914  NA 134*  < > 137  K 3.7  < > 3.3*  CL 108  < > 113*  CO2 16*  < > 17*  BUN 38*  < > 37*  CREATININE 3.28*  < > 3.04*  CALCIUM 8.2*  < > 8.2*  PROT 6.9  --   --   BILITOT 0.3  --   --   ALKPHOS 82  --   --   ALT 14*  --   --   AST 29  --   --   GLUCOSE 552*  < > 104*  < > = values in this interval not displayed.  Lipid Panel     Component Value Date/Time   CHOL 129 11/16/2015 1218   TRIG 89 11/16/2015 1218   HDL 38 (L) 11/16/2015 1218   CHOLHDL 3.4 11/16/2015 1218   VLDL 18 11/16/2015 1218   LDLCALC 73 11/16/2015 1218    Recent Labs  11/15/15 2157  BNP 597.3*   Recent Labs  11/16/15 0257 11/16/15 0630 11/16/15 0914  TROPONINI 5.34* 7.46* 7.46*   Recent Labs  11/16/15 0257  TSH 2.954    EKG: Sinus tachycardia otherwise no signal Changes to suggest ischemia or infarct.  Echo: Pending   Radiology: Dg Chest Portable 1 View  Result Date: 11/15/2015 CLINICAL DATA:  Altered mental status.  The patient was found down. EXAM: PORTABLE CHEST 1 VIEW COMPARISON:  01/09/2013 FINDINGS: The heart size and mediastinal contours are  within normal limits. Both lungs are clear. The visualized skeletal structures are unremarkable. IMPRESSION: Normal exam. Electronically Signed   By: Lorriane Shire M.D.   On: 11/15/2015 19:11   Ct Head Code Stroke Wo Contrast`  Result Date: 11/15/2015 CLINICAL DATA:  Code stroke. Altered mental status. Not using RIGHT side. EXAM: CT HEAD WITHOUT  CONTRAST TECHNIQUE: Contiguous axial images were obtained from the base of the skull through the vertex without intravenous contrast. COMPARISON:  CT head 01/09/2013. FINDINGS: Brain: Advanced atrophy with chronic microvascular ischemic change. No visible hemorrhage, mass lesion, hydrocephalus, or extra-axial fluid. Vascular: No hyperdense vessel or parenchymal calcification. Advanced vascular calcification in the carotid siphon and distal vertebral arteries. No features suggestive of large vessel occlusion. Chronic periventricular deep white matter infarct, RIGHT parietal lobe. Skull: Normal. Negative for fracture or focal lesion. Sinuses/Orbits: No acute finding. Other: None ASPECTS ([place] Stroke Program Early CT Score) - Ganglionic level infarction (caudate, lentiform nuclei, internal capsule, insula, M1-M3 cortex): 7 - Supraganglionic infarction (M4-M6 cortex): 3 Total score (0-10 with 10 being normal): 10 IMPRESSION: 1. Atrophy and small vessel disease. No acute intracranial findings are evident. 2. ASPECTS is 10. These results were called by telephone at the time of interpretation on 11/15/2015 at 7:25 pm to Dr. Leonard Schwartz , who verbally acknowledged these results. Electronically Signed   By: Staci Righter M.D.   On: 11/15/2015 19:30    Scheduled Meds: . aspirin  300 mg Rectal Daily  . chlorhexidine  15 mL Mouth Rinse BID  . folic acid  1 mg Intravenous Daily  . heparin subcutaneous  5,000 Units Subcutaneous Q8H  . insulin aspart  0-15 Units Subcutaneous Q4H  . levothyroxine  12.5 mcg Intravenous Daily  . mouth rinse  15 mL Mouth Rinse q12n4p  .  metoprolol  2.5 mg Intravenous Q6H  . pantoprazole  40 mg Intravenous Q12H  . potassium chloride  10 mEq Intravenous Q1 Hr x 4  . thiamine injection  100 mg Intravenous Daily   Continuous Infusions: . lactated ringers 125 mL/hr at 11/16/15 1205   PRN Meds:.acetaminophen, hydrALAZINE, labetalol, ondansetron (ZOFRAN) IV  ASSESSMENT AND PLAN:  1. NSTEMI 2. Diabetic ketoacidosis with diabetic pre-coma 3. Hypertension 4. Acute on chronic diabetic kidney disease, stage III no stage IV with EGFR 20 mL 5. Hyperlipidemia 6. Chronic anemia 7. H/O TIA/Stroke.  Recommendation: Patient clearly needs further cardiac workup. But has significant underlying medical comorbidities including acute renal failure, severe anemia, diabetic ketoacidosis and altered mental status. I will restart by mouth beta blockers and also Crestor. I will add BRILINTA for ACS, medical management for ACS until he is more stable for angiography. He may have multivessel disease but his coronary angiography may not be performed for quite a while until his metabolic issues are resolved and renal function is stable. Fortunately he is not having active chest pain, congestive heart failure on exam. CBC has been stable and should be okay to start DAPT.  Adrian Prows, MD 11/16/2015, 1:29 PM [facility] [place] Pager: [phone] Office: [phone] If no answer Cell [phone]

## 2015-11-16 NOTE — Evaluation (Signed)
Clinical/Bedside Swallow Evaluation Patient Details  Name: Dillon Thomas MRN: [id number] Date of Birth: 1950-07-10  Today's Date: 11/16/2015 Time: SLP Start Time (ACUTE ONLY): 1640 SLP Stop Time (ACUTE ONLY): 1655 SLP Time Calculation (min) (ACUTE ONLY): 15 min  Past Medical History:  Past Medical History:  Diagnosis Date  . BPH (benign prostatic hyperplasia)   . Chronic kidney disease   . Depression   . Diabetes mellitus without complication ([place])   . GERD (gastroesophageal reflux disease)   . Hyperlipidemia   . Hypertension   . Hypothyroidism   . TIA (transient ischemic attack)    Past Surgical History:  Past Surgical History:  Procedure Laterality Date  . APPENDECTOMY    . CHOLECYSTECTOMY    . CYSTOURETHROSCOPY    . POLYPECTOMY     Vocal cord polyp removed  . VASECTOMY     HPI:  65 yo male with hx uncontrolled DM, CKD 3, GERD, HTN, previous TIA, early dementia presented 10/30 with 1 week hx progressive weakness then found this am by his girlfriend/caregiver with AMS, unable to speak or get up. In ER was noted to have blood glucose >800, AKI with Scr 3.5, metabolic acidosis, HTN with SBP 200. He was admitted by [facility] with DKA/HHNK but there was some concern for CVA and MRI was ordered. In MRI pt had episode of emesis, study was aborted.    Assessment / Plan / Recommendation Clinical Impression  Pt with subtle s/s of dysphagia with aspiration risk.  Demonstrates improving MS today per RN but with persisting confusion.  No focal CN deficits noted; follows commands intermittently.  Multiple sub-swallows required with each bolus regardless of consistency; mild voice changes post-swallow, but no overt coughing. For tonight, recommend allowing meds whole in puree, ice chips/small, limited sips of water after oral care.  D/W RN.  SLP will f/u next date for toleration and diet progression - anticipate will be able to advance PO diet tomorrow.     Aspiration Risk  Mild aspiration  risk    Diet Recommendation   npo except ice chips/small, limited sips of water   Medication Administration: Whole meds with puree    Other  Recommendations Oral Care Recommendations: Oral care BID   Follow up Recommendations        Frequency and Duration min 2x/week  2 weeks       Prognosis Prognosis for Safe Diet Advancement: Good      Swallow Study   General Date of Onset: 11/15/15 HPI: 65 yo male with hx uncontrolled DM, CKD 3, GERD, HTN, previous TIA, early dementia presented 10/30 with 1 week hx progressive weakness then found this am by his girlfriend/caregiver with AMS, unable to speak or get up. In ER was noted to have blood glucose >800, AKI with Scr 3.5, metabolic acidosis, HTN with SBP 200. He was admitted by [facility] with DKA/HHNK but there was some concern for CVA and MRI was ordered. In MRI pt had episode of emesis, study was aborted.  Type of Study: Bedside Swallow Evaluation Previous Swallow Assessment: no Diet Prior to this Study: NPO Temperature Spikes Noted: No Respiratory Status: Room air History of Recent Intubation: No Behavior/Cognition: Alert Oral Cavity Assessment: Within Functional Limits Oral Care Completed by SLP: Recent completion by staff Oral Cavity - Dentition: Adequate natural dentition Vision: Functional for self-feeding Self-Feeding Abilities: Needs assist Patient Positioning: Upright in bed Baseline Vocal Quality: Hoarse Volitional Cough: Strong Volitional Swallow: Able to elicit    Oral/Motor/Sensory Function Overall Oral  Motor/Sensory Function: Within functional limits   Ice Chips Ice chips: Within functional limits   Thin Liquid Thin Liquid: Impaired Presentation: Cup Pharyngeal  Phase Impairments: Multiple swallows    Nectar Thick Nectar Thick Liquid: Not tested   Honey Thick Honey Thick Liquid: Not tested   Puree Puree: Impaired Presentation: Spoon Pharyngeal Phase Impairments: Multiple swallows   Solid   GO   Solid: Not  tested        Juan Quam Laurice 11/16/2015,5:04 PM   Estill Bamberg L. Tivis Ringer, [place] CCC/SLP Pager [phone]

## 2015-11-16 NOTE — Progress Notes (Signed)
STAT MRI was not obtained due to choking incident in radiology prior to transfer into the scanner.  RN suctioned the patient and brought him back to the ED for evaluation.  No desaturations documented but there is concern about the patient's ability to protect his airway.  Greatly appreciate neurology and critical care assessments and recommendations for this patient.  The patient will transfer to the ICU for higher level of care.  Blood sugars are starting to improve.  Ultimately, he will need repeat neurological assessment once his hyperglycemia is corrected.  Per discussion with neurohospitalist Dr. Leonel Ramsay, it is quite possible that patient had a subacute stroke at some point, and now pre-existing neurological deficits are being exacerbated by his degree of hyperglycemia.  Of note, repeat troponin is more elevated than the first, BNP is elevated, and the patient remains hypertensive and tachycardia.  Agree with starting to treat these parameters cautiously since further neuro imaging will be deferred for now.  Since hemoglobin is trending downward, we will continue to defer anticoagulation at this time.  The patient remains critically ill with high risk for decompensation.

## 2015-11-16 NOTE — Progress Notes (Signed)
EEG completed, results pending. 

## 2015-11-16 NOTE — Significant Event (Signed)
Per Dr Ashok Cordia, can hold off on MRI until patient can safely lay still and not at risk of aspirating when flat.

## 2015-11-16 NOTE — Procedures (Signed)
HPI:  65 y/o with MS change  TECHNICAL SUMMARY:  A multichannel referential and bipolar montage EEG using the standard international 10-20 system was performed on the patient described as very drowsy and asleep.  When most awake, there is a 6.5-7.5 Hz occipital dominant rhythm that is minimally reactive to eye opening and closing procedures.  4-5 Hz activity can be seen intermixed, primarily in the frontal regions bilaterally.  Low voltage fast (beta) activity is distributed symmetrically and maximally over the anterior head regions.  ACTIVATION:  Stepwise photic stimulation and hyperventilation are not performed  EPILEPTIFORM ACTIVITY:  There were no spikes, sharp waves or paroxysmal activity.  SLEEP:  Most of the recording is spent in stage I and stage II sleep architecture  CARDIAC:  The EKG lead revealed a regular rhythm.    IMPRESSION:  This is an abnormal EEG demonstrating a mild diffuse slowing of electrocerebral activity.  This can be seen in a wide variety of encephalopathic state including those of a toxic, metabolic, or degenerative nature.  There were no focal, hemispheric, or lateralizing features.  No epileptiform activity was recorded.

## 2015-11-16 NOTE — Significant Event (Signed)
Patient following commands, not pulling on tubes and lines. Bilateral wrist restraints removed. Bed alarm on; seizure precaution remains.

## 2015-11-16 NOTE — Consult Note (Signed)
Neurology Consultation Reason for Consult: Right-sided weakness Referring Physician: Eulas Post, and  CC: Right-sided weakness  History is obtained from: Wife  HPI: Dillon Thomas is a 65 y.o. male he was last in his normal state of health this morning around 11:30 AM. Of note, he has recently had difficulties with aphasia and is being evaluated by speech therapy. He is also had difficulties with his right side, particular dragging his right leg. He presents with unresponsiveness in the setting of blood sugars in the upper 800s.  Of note, he is also in the early stages of dementia.   LKW: 11:30 AM tpa given?: no, outside of window Premorbid modified rankin scale: 3  ROS:  Unable to obtain due to altered mental status.   Past Medical History:  Diagnosis Date  . BPH (benign prostatic hyperplasia)   . Chronic kidney disease   . Depression   . Diabetes mellitus without complication ([place])   . GERD (gastroesophageal reflux disease)   . Hyperlipidemia   . Hypertension   . Hypothyroidism   . TIA (transient ischemic attack)      Family History  Problem Relation Age of Onset  . CAD Mother   . Diabetes Mother   . Hyperlipidemia Mother   . Hypertension Mother   . CVA Mother   . Alcohol abuse Father   . Diabetes Father   . Hyperlipidemia Father      Social History:  reports that he quit smoking about 3 years ago. He has never used smokeless tobacco. He reports that he does not drink alcohol or use drugs.   Exam: Current vital signs: BP 200/90   Pulse (!) 126   Temp 98.9 F (37.2 C) (Axillary)   Resp 21   Wt 99.8 kg (220 lb)   SpO2 99%  Vital signs in last 24 hours: Temp:  [98.9 F (37.2 C)] 98.9 F (37.2 C) (10/29 1822) Pulse Rate:  [119-126] 126 (10/29 2230) Resp:  [13-27] 21 (10/29 2230) BP: (160-206)/(89-97) 200/90 (10/29 2230) SpO2:  [97 %-100 %] 99 % (10/29 2230) Weight:  [99.8 kg (220 lb)] 99.8 kg (220 lb) (10/29 1827)   Physical Exam  Constitutional:  Appears well-developed and well-nourished.  Psych: Affect appropriate to situation Eyes: No scleral injection HENT: He has some dried blood in his mouth of which I am unable to locate a definite source.  Head: Normocephalic.  Cardiovascular: Normal rate and regular rhythm.  Respiratory: He has copious upper airway secretions.  GI: Soft.  No distension. There is no tenderness.  Skin: WDI  Neuro: Mental Status: Patient is obtunded, does not fixate or track. He does keep eyes open with noxious stimulation. He does not follow commands.  Cranial Nerves: II: Visual Fields are full. Pupils are equal, round, and reactive to light.   III,IV, VI: EOMI without ptosis or diploplia.  V: Facial sensation is symmetric to temperature VII: Facial movement is symmetric  VIII: hearing is intact to voice X: Uvula elevates symmetrically XI: Shoulder shrug is symmetric. XII: tongue is midline without atrophy or fasciculations.  Motor: He has a severe right arm paresis, though he does flex it somewhat to pain. He quasi-localizes on the left(crosses clavicle but not midline).  Sensory: He  grimaces to noxious stimulation in all 4 extremities Cerebellar: He does not perform  I have reviewed labs in epic and the results pertinent to this consultation are: Glucose 870 Creatinine 3.5 Sodium 127  I have reviewed the images obtained: CT head-unremarkable  Impression:  65 year old male with underlying aphasia and right-sided weakness(drags his leg per wife) now with more pronounced right-sided weakness and encephalopathy in the setting of severe hypoglycemia, renal failure, possible MI. Possibilities include focal seizure due to severe hyperglycemia, encephalopathy due to hyperosmolar state exacerbating underlying symptoms, new ischemic stroke. He is outside the window for any type of stroke therapy. He was found down, and especially with his possible right-sided weakness prior to this, concern for possible  cervical injuries there.  Recommendations: 1) MRI brain and cervical spine, if he continues to be too unstable to obtain this, then could repeat a head CT tomorrow 2) EEG 3) would use C-collar until MRI obtained 4) control of medical comorbidities 5) neurology will continue to follow   Roland Rack, MD [facility] [phone]  If 7pm- 7am, please page neurology on call as listed in [place].

## 2015-11-16 NOTE — Progress Notes (Signed)
PULMONARY / CRITICAL CARE MEDICINE   Name: Dillon Thomas MRN: [id number] DOB: Jun 19, 1950    ADMISSION DATE:  11/15/2015 CONSULTATION DATE:  10/29  REFERRING MD:  Eulas Post ([facility])   CHIEF COMPLAINT:  DKA, AMS   HISTORY OF PRESENT ILLNESS:   65yo male with hx uncontrolled DM, CKD 3, GERD, HTN, previous TIA presented 10/30 with 1 week hx progressive weakness then found this am by his girlfriend/caregiver with AMS, unable to speak or get up.  In ER was noted to have blood glucose >800, AKI with Scr 3.5, metabolic acidosis, HTN with SBP 200.  He was admitted by [facility] with DKA/HHNK but there was some concern for CVA and MRI was ordered.  In MRI pt had episode of emesis, unable to cooperate with study and PCCM consulted.   SUBJECTIVE: Questionable aspiration with nausea & emesis overnight when attempting to get MRI.  REVIEW OF SYSTEMS:  Unable to obtain with encephalopathy.   VITAL SIGNS: BP (!) 171/82 (BP Location: Right Arm)   Pulse (!) 112   Temp 99.7 F (37.6 C) (Oral)   Resp (!) 27   Ht 6' (1.829 m)   Wt 220 lb (99.8 kg)   SpO2 98%   BMI 29.84 kg/m   HEMODYNAMICS:    VENTILATOR SETTINGS:    INTAKE / OUTPUT: I/O last 3 completed shifts: In: 3893.4 [I.V.:593.4; IV [id number] Out: [id number] [Urine:1385]  PHYSICAL EXAMINATION: General:  No family at bedside. Sleeping until awoken. Integument:  Warm & dry. No rash on exposed skin.  HEENT:  Dry mucous membranes. Cervical collar in place. No scleral icterus. Cardiovascular:  Tachycardic. Normal S1 & S2. No appreciable JVD.  Pulmonary:  Normal work of breathing. Overall clear on auscultation bilaterally. Abdomen: Soft. Normal bowel sounds. Nondistended.  Neurological: Open his eyes to voice. Answers questions with "I don't know". Doesn't follow commands.  LABS:  BMET  Recent Labs Lab 11/16/15 0257 11/16/15 0630 11/16/15 0914  NA 137 139 137  K 3.1* 3.7 3.3*  CL 113* 114* 113*  CO2 15* 18* 17*  BUN 37* 38* 37*   CREATININE 3.19* 3.25* 3.04*  GLUCOSE 183* 154* 104*    Electrolytes  Recent Labs Lab 11/16/15 0257 11/16/15 0630 11/16/15 0914  CALCIUM 8.1* 8.1* 8.2*    CBC  Recent Labs Lab 11/15/15 1830 11/15/15 1841 11/15/15 2328  WBC 13.7*  --  9.9  HGB 9.0* 9.9* 8.5*  HCT 31.1* 29.0* 26.2*  PLT 399  --  335    Coag's  Recent Labs Lab 11/15/15 1830  APTT 33  INR 1.36    Sepsis Markers  Recent Labs Lab 11/15/15 2328  PROCALCITON 3.56    ABG  Recent Labs Lab 11/15/15 2243  PHART 7.342*  PCO2ART 27.5*  PO2ART 78.0*    Liver Enzymes  Recent Labs Lab 11/15/15 1830 11/15/15 2328  AST 23 29  ALT 14* 14*  ALKPHOS 94 82  BILITOT 0.6 0.3  ALBUMIN 1.9* 1.8*    Cardiac Enzymes  Recent Labs Lab 11/16/15 0257 11/16/15 0630 11/16/15 0914  TROPONINI 5.34* 7.46* 7.46*    Glucose  Recent Labs Lab 11/16/15 0359 11/16/15 0502 11/16/15 0605 11/16/15 0717 11/16/15 0811 11/16/15 0915  GLUCAP 187* 183* 160* 144* 113* 105*    Imaging Dg Chest Portable 1 View  Result Date: 11/15/2015 CLINICAL DATA:  Altered mental status.  The patient was found down. EXAM: PORTABLE CHEST 1 VIEW COMPARISON:  01/09/2013 FINDINGS: The heart size and mediastinal contours are within normal limits.  Both lungs are clear. The visualized skeletal structures are unremarkable. IMPRESSION: Normal exam. Electronically Signed   By: Lorriane Shire M.D.   On: 11/15/2015 19:11   Ct Head Code Stroke Wo Contrast`  Result Date: 11/15/2015 CLINICAL DATA:  Code stroke. Altered mental status. Not using RIGHT side. EXAM: CT HEAD WITHOUT CONTRAST TECHNIQUE: Contiguous axial images were obtained from the base of the skull through the vertex without intravenous contrast. COMPARISON:  CT head 01/09/2013. FINDINGS: Brain: Advanced atrophy with chronic microvascular ischemic change. No visible hemorrhage, mass lesion, hydrocephalus, or extra-axial fluid. Vascular: No hyperdense vessel or parenchymal  calcification. Advanced vascular calcification in the carotid siphon and distal vertebral arteries. No features suggestive of large vessel occlusion. Chronic periventricular deep white matter infarct, RIGHT parietal lobe. Skull: Normal. Negative for fracture or focal lesion. Sinuses/Orbits: No acute finding. Other: None ASPECTS ([place] Stroke Program Early CT Score) - Ganglionic level infarction (caudate, lentiform nuclei, internal capsule, insula, M1-M3 cortex): 7 - Supraganglionic infarction (M4-M6 cortex): 3 Total score (0-10 with 10 being normal): 10 IMPRESSION: 1. Atrophy and small vessel disease. No acute intracranial findings are evident. 2. ASPECTS is 10. These results were called by telephone at the time of interpretation on 11/15/2015 at 7:25 pm to Dr. Leonard Schwartz , who verbally acknowledged these results. Electronically Signed   By: Staci Righter M.D.   On: 11/15/2015 19:30     STUDIES:  CT HEAD W/O 10/29:  Atrophy and small vessel disease. No acute intracranial findings are evident. ASPECTS is 10.  MICROBIOLOGY: MRSA PCR 10/30:  Negative  BC x 2 10/30>>> Urine 10/30>>>  ANTIBIOTICS: None  SIGNIFICANT EVENTS: 10/29 - Admit after found down.  LINES/TUBES: Foley >> PIV x2  ASSESSMENT / PLAN:  ENDOCRINE A: HHNK  H/O DM Type 2 - Poorly controlled at baseline. H/O Hypothyroidism - TSH   P:   Synthroid IV daily Holding home Humalog 75/25 & Trajenta Checking Hgb A1c & Free T4 Stopping Insulin drip Changing to Accu-Checks q4hr SSI per Moderate Algorithm   CARDIOVASCULAR A: Elevated Troponin I - Possible ischemia.  Essential Hypertension - Poorly controlled.  P:  Vitals per unit protocol Continuous telemetry monitoring Checking Complete Echocardiogram Lopressor IV q6hr ASA 300mg  PR daily Consulting Cardiology for further recommendations Holding home Monopril, Crestor, & Toprol XL Checking Lipid Panel  NEUROLOGIC A: Acute Encephalopathy -  Multifactorial. Possible Seizure - At home. Baseline speech & gait disturbances.  P:   Seizure Precautions Avoiding sedating medications MRI of Brain & C-Spine ordered & pending Neurology Following & appreciate recommendations EEG pending Holding home Wellbutrin XL Starting Thiamine & Folic Acid IV daily  RENAL A: Acute on Chronic Renal Failure Stage II - Improving. Baseline creatinine 1.5-1.9. Metabolic Acidosis - Mild.  Hypokalemia - Mild. Holding on replacement for now. Hyponatremia - Mild. Resolved.  P:   Trending UOP with Foley Monitoring electrolytes & renal function daily Replacing electrolytes as indicated D/C 1/2 NS @ 100cc/hr MIVF D/C D5 w/ 1/2 NS @ 100cc/hr MIVF KCl 38mEq IV x4 runs Starting LR @ 125cc/hr MIVF Repeat BMP @ 1700 today  GASTROINTESTINAL A: Questionable GIB H/O GERD  P:   NPO Protonix IV q12hr Holding home Zantac  PULMONARY A: Possible Aspiration  P:   Continuous Pulse Oximetry   HEMATOLOGIC A: Anemia - No signs of active bleeding. Leukocytosis - Resolved.  P:  Trending cell counts daily w/ CBC Checking Hgb/Hct @ 1700 today SCDs Heparin [place] q8hr FOBT pending Transfuse for Hgb <8.0 or signs  of active bleeding  INFECTIOUS A: No obvious source infection   P:   Holding off on antibiotics at this time Checking Blood & Urine Cultures Trending Procalcitonin per algorithm  MUSCULOSKELETAL A: Possible Fall - Found down.   A: Cervical Collar MRI C-spine  FAMILY  - Updates:  Girlfriend/caretaker updated at bedside 10/30, she updated pts daughter via phone while I was in room   - Inter-disciplinary family meet or Palliative Care meeting due by:  11/6  TODAY'S SUMMARY:  65 y.o. male chronically ill male with uncontrolled DM, CKD, HTN admitted 10/30 with HHNK and acute on chronic renal failure. With patient's rising troponin I I have consulted cardiology for further evaluation and recommendations while awaiting transthoracic  echocardiogram. Transitioning from insulin drip to subcutaneous insulin with repeat basic metabolic panel today at [id number] hrs. Given patient's ongoing encephalopathy as well as multisystem organ failure I feel that continued ICU monitoring is necessary.  I have spent an additional 33 minutes of critical care time today caring for the patient and reviewing the patient's electronic medical record.  Sonia Baller Ashok Cordia, M.D. [facility]monary [facility] Critical [facility] Pager:  [phone] After 3pm or if no response, call [phone] 11/16/2015 11:07 AM

## 2015-11-16 NOTE — Consult Note (Signed)
PULMONARY / CRITICAL CARE MEDICINE   Name: Dillon Thomas MRN: [id number] DOB: 1950/10/23    ADMISSION DATE:  11/15/2015 CONSULTATION DATE:  10/29  REFERRING MD:  Eulas Post ([facility])   CHIEF COMPLAINT:  DKA, AMS   HISTORY OF PRESENT ILLNESS:   65yo male with hx uncontrolled DM, CKD 3, GERD, HTN, previous TIA presented 10/30 with 1 week hx progressive weakness then found this am by his girlfriend/caregiver with AMS, unable to speak or get up.  In ER was noted to have blood glucose >800, AKI with Scr 3.5, metabolic acidosis, HTN with SBP 200.  He was admitted by [facility] with DKA/HHNK but there was some concern for CVA and MRI was ordered.  In MRI pt had episode of emesis, unable to cooperate with study and PCCM consulted.   PAST MEDICAL HISTORY :  He  has a past medical history of BPH (benign prostatic hyperplasia); Chronic kidney disease; Depression; Diabetes mellitus without complication ([place]); GERD (gastroesophageal reflux disease); Hyperlipidemia; Hypertension; Hypothyroidism; and TIA (transient ischemic attack).  PAST SURGICAL HISTORY: He  has a past surgical history that includes Appendectomy; Cholecystectomy; Cystourethroscopy; Vasectomy; and Polypectomy.  Allergies  Allergen Reactions  . Penicillins   . Tetracyclines & Related     No current facility-administered medications on file prior to encounter.    Current Outpatient Prescriptions on File Prior to Encounter  Medication Sig  . allopurinol (ZYLOPRIM) 100 MG tablet Take 100 mg by mouth daily.  Marland Kitchen buPROPion (WELLBUTRIN XL) 300 MG 24 hr tablet Take 300 mg by mouth daily.  . fosinopril (MONOPRIL) 20 MG tablet Take 20 mg by mouth daily.  Marland Kitchen HYDROcodone-acetaminophen (NORCO/VICODIN) 5-325 MG tablet Take 1 tablet by mouth every 6 (six) hours as needed for moderate pain.  Marland Kitchen insulin lispro protamine-lispro (HUMALOG 75/25 MIX) (75-25) 100 UNIT/ML SUSP injection Inject into the skin.  Marland Kitchen levothyroxine (SYNTHROID, LEVOTHROID) 25 MCG tablet  Take 25 mcg by mouth daily before breakfast.  . linagliptin (TRADJENTA) 5 MG TABS tablet Take 5 mg by mouth daily.  . metoprolol succinate (TOPROL-XL) 25 MG 24 hr tablet Take 25 mg by mouth daily.  . ranitidine (ZANTAC) 150 MG tablet Take 150 mg by mouth 2 (two) times daily.  . rosuvastatin (CRESTOR) 10 MG tablet Take 10 mg by mouth daily.    FAMILY HISTORY:  His indicated that the status of his mother is unknown. He indicated that the status of his father is unknown.    SOCIAL HISTORY: He  reports that he quit smoking about 3 years ago. He has never used smokeless tobacco. He reports that he does not drink alcohol or use drugs.  REVIEW OF SYSTEMS:   As per HPI - All other systems reviewed and were neg.    SUBJECTIVE:    VITAL SIGNS: BP 200/90   Pulse (!) 126   Temp 98.9 F (37.2 C) (Axillary)   Resp 21   Wt 99.8 kg (220 lb)   SpO2 99%   HEMODYNAMICS:    VENTILATOR SETTINGS:    INTAKE / OUTPUT: No intake/output data recorded.  PHYSICAL EXAMINATION: General:  Chronically ill appearing male, NAD  Neuro:  Somnolent but easily arousable, does not follow commands, MAE spontaneously HEENT:  Mm dry, poor dentition  Cardiovascular:  s1s2 tachy  Lungs:  resps even, non labored, tachypneic, diminished bases otherwise ess clear  Abdomen:  Round, soft, mildly tender  Musculoskeletal:  Warm and dry, no edema   LABS:  BMET  Recent Labs Lab 11/15/15 1830 11/15/15  1841 11/15/15 2328  NA 127* 131* 134*  K 3.9 4.0 3.7  CL 99* 99* 108  CO2 14*  --  16*  BUN 41* 40* 38*  CREATININE 3.48* 3.30* 3.28*  GLUCOSE 870* >700* 552*    Electrolytes  Recent Labs Lab 11/15/15 1830 11/15/15 2328  CALCIUM 8.4* 8.2*    CBC  Recent Labs Lab 11/15/15 1830 11/15/15 1841 11/15/15 2328  WBC 13.7*  --  9.9  HGB 9.0* 9.9* 8.5*  HCT 31.1* 29.0* 26.2*  PLT 399  --  335    Coag's  Recent Labs Lab 11/15/15 1830  APTT 33  INR 1.36    Sepsis Markers No results for  input(s): LATICACIDVEN, PROCALCITON, O2SATVEN in the last 168 hours.  ABG  Recent Labs Lab 11/15/15 2243  PHART 7.342*  PCO2ART 27.5*  PO2ART 78.0*    Liver Enzymes  Recent Labs Lab 11/15/15 1830 11/15/15 2328  AST 23 29  ALT 14* 14*  ALKPHOS 94 82  BILITOT 0.6 0.3  ALBUMIN 1.9* 1.8*    Cardiac Enzymes  Recent Labs Lab 11/15/15 2156  TROPONINI 0.89*    Glucose  Recent Labs Lab 11/15/15 1846 11/15/15 2021 11/15/15 2134 11/15/15 2236 11/15/15 2351  GLUCAP >600* >600* >600* 527* 575*    Imaging Dg Chest Portable 1 View  Result Date: 11/15/2015 CLINICAL DATA:  Altered mental status.  The patient was found down. EXAM: PORTABLE CHEST 1 VIEW COMPARISON:  01/09/2013 FINDINGS: The heart size and mediastinal contours are within normal limits. Both lungs are clear. The visualized skeletal structures are unremarkable. IMPRESSION: Normal exam. Electronically Signed   By: Lorriane Shire M.D.   On: 11/15/2015 19:11   Ct Head Code Stroke Wo Contrast`  Result Date: 11/15/2015 CLINICAL DATA:  Code stroke. Altered mental status. Not using RIGHT side. EXAM: CT HEAD WITHOUT CONTRAST TECHNIQUE: Contiguous axial images were obtained from the base of the skull through the vertex without intravenous contrast. COMPARISON:  CT head 01/09/2013. FINDINGS: Brain: Advanced atrophy with chronic microvascular ischemic change. No visible hemorrhage, mass lesion, hydrocephalus, or extra-axial fluid. Vascular: No hyperdense vessel or parenchymal calcification. Advanced vascular calcification in the carotid siphon and distal vertebral arteries. No features suggestive of large vessel occlusion. Chronic periventricular deep white matter infarct, RIGHT parietal lobe. Skull: Normal. Negative for fracture or focal lesion. Sinuses/Orbits: No acute finding. Other: None ASPECTS ([place] Stroke Program Early CT Score) - Ganglionic level infarction (caudate, lentiform nuclei, internal capsule, insula, M1-M3  cortex): 7 - Supraganglionic infarction (M4-M6 cortex): 3 Total score (0-10 with 10 being normal): 10 IMPRESSION: 1. Atrophy and small vessel disease. No acute intracranial findings are evident. 2. ASPECTS is 10. These results were called by telephone at the time of interpretation on 11/15/2015 at 7:25 pm to Dr. Leonard Schwartz , who verbally acknowledged these results. Electronically Signed   By: Staci Righter M.D.   On: 11/15/2015 19:30     STUDIES:  CT head 10/30>>>  CULTURES: BC x 2 10/30>>> Urine 10/30>>>  ANTIBIOTICS:   SIGNIFICANT EVENTS:   LINES/TUBES:   DISCUSSION: 65yo chronically ill male with uncontrolled DM, CKD, HTN admitted 10/30 with HHNK and AKI.  Had some concern ?CVA but had episode emesis during MRI which was aborted.    ASSESSMENT / PLAN:  PULMONARY Concern for aspiration  P:   F/u CXR  resp status currently acceptable but monitor closely in ICU  Hold off on initiating abx for now   CARDIOVASCULAR HTN - poorly controlled  Elevated  troponin   P:  Labetalol 10mg  IV x 1 now and q2 hrs PRN  Low dose scheduled metoprolol - hold home toprol xl  F/u EKG  Echo in am  Trend troponin  BNP   RENAL AKI on CKD - baseline Scr ~2  HHNK  Hyponatremia  P:   Volume resuscitation  F/u chem   GASTROINTESTINAL Hx GERD  ?emesis  P:   NPO  PPI   HEMATOLOGIC Anemia - chronic P:  F/u CBC  SCD's for now  INFECTIOUS No obvious source infection  P:   Pan culture  Check pct   ENDOCRINE HHNK  Poorly controlled DM at baseline  Hypothyroid  P:   Insulin gtt per DKA protocol  Volume resuscitation as above  Check TSH  Continue levothyroxine IV dose for now   NEUROLOGIC AMS - suspect r/t metabolic derangements, HHNK  ?seizure activity at home - at baseline has speech and gait disturbances.  P:   EEG in am  Hold off on MRI for now - correct metabolic issues then re-assess  If no improvement in mental status may need to consider intubation to allow  MRI  Consider neuro input in am    FAMILY  - Updates:  Girlfriend/caretaker updated at bedside 10/30, she updated pts daughter via phone while I was in room   - Inter-disciplinary family meet or Palliative Care meeting due by:  11/6   Nickolas Madrid, NP 11/16/2015  12:56 AM Pager: [phone] or [phone]  STAFF NOTE: Linwood Dibbles, MD FACP have personally reviewed patient's available data, including medical history, events of note, physical examination and test results as part of my evaluation. I have discussed with resident/NP and other care providers such as pharmacist, RN and RRT. In addition, I personally evaluated patient and elicited key findings [id number], agitation, moves ext, perrl, collar on, no neck pain reported, CT reviewed neg, appears to be neuro changes from HNK, continued insulin drip, has NONAG, changed fluids to 1/2 NS, avoid saline further, bmet q4h, trop noted, r/o NSTEMI, has NO ecg changes, add asa, BB, echo, assess trop peak, does not appear to be infected, continued hydration, follow crt closely, will need neck clearance furtehr when he can  Give a reliable history, MRI in future when stable for this he cannot go to MRI now would not be safe, K supp, frequent bmet assessments for lytes  The patient is critically ill with multiple organ systems failure and requires high complexity decision making for assessment and support, frequent evaluation and titration of therapies, application of advanced monitoring technologies and extensive interpretation of multiple databases.   Critical Care Time devoted to patient care services described in this note is 35 Minutes. This time reflects time of care of this signee: Merrie Roof, MD FACP. This critical care time does not reflect procedure time, or teaching time or supervisory time of PA/NP/Med student/Med Resident etc but could involve care discussion time. Rest per NP/medical resident whose note is outlined  above and that I agree with   Lavon Paganini. Titus Mould, MD, [place] Pgr: [facility] 11/16/2015 6:38 AM

## 2015-11-16 NOTE — Progress Notes (Signed)
Dr. Alva Garnet made aware of most recent Bmet and Troponin results. Orders received. Will continue to closely monitor. Richarda Blade RN

## 2015-11-17 ENCOUNTER — Inpatient Hospital Stay (HOSPITAL_COMMUNITY): Payer: MEDICARE

## 2015-11-17 DIAGNOSIS — R739 Hyperglycemia, unspecified: Secondary | ICD-10-CM

## 2015-11-17 DIAGNOSIS — G9340 Encephalopathy, unspecified: Secondary | ICD-10-CM

## 2015-11-17 DIAGNOSIS — I214 Non-ST elevation (NSTEMI) myocardial infarction: Secondary | ICD-10-CM

## 2015-11-17 LAB — ECHOCARDIOGRAM COMPLETE
HEIGHTINCHES: 72 in
Weight: 3520 oz

## 2015-11-17 LAB — CBC WITH DIFFERENTIAL/PLATELET
BASOS ABS: 0 10*3/uL (ref 0.0–0.1)
BASOS PCT: 0 %
EOS PCT: 0 %
Eosinophils Absolute: 0 10*3/uL (ref 0.0–0.7)
HCT: 23.4 % — ABNORMAL LOW (ref 39.0–52.0)
Hemoglobin: 7.6 g/dL — ABNORMAL LOW (ref 13.0–17.0)
Lymphocytes Relative: 26 %
Lymphs Abs: 3 10*3/uL (ref 0.7–4.0)
MCH: 28.1 pg (ref 26.0–34.0)
MCHC: 32.5 g/dL (ref 30.0–36.0)
MCV: 86.7 fL (ref 78.0–100.0)
MONO ABS: 1.9 10*3/uL — AB (ref 0.1–1.0)
Monocytes Relative: 16 %
NEUTROS ABS: 6.6 10*3/uL (ref 1.7–7.7)
Neutrophils Relative %: 58 %
PLATELETS: 299 10*3/uL (ref 150–400)
RBC: 2.7 MIL/uL — ABNORMAL LOW (ref 4.22–5.81)
RDW: 14.7 % (ref 11.5–15.5)
WBC: 11.5 10*3/uL — AB (ref 4.0–10.5)

## 2015-11-17 LAB — GLUCOSE, CAPILLARY
GLUCOSE-CAPILLARY: 177 mg/dL — AB (ref 65–99)
GLUCOSE-CAPILLARY: 278 mg/dL — AB (ref 65–99)
GLUCOSE-CAPILLARY: 281 mg/dL — AB (ref 65–99)
GLUCOSE-CAPILLARY: 322 mg/dL — AB (ref 65–99)
Glucose-Capillary: 186 mg/dL — ABNORMAL HIGH (ref 65–99)
Glucose-Capillary: 191 mg/dL — ABNORMAL HIGH (ref 65–99)
Glucose-Capillary: 306 mg/dL — ABNORMAL HIGH (ref 65–99)

## 2015-11-17 LAB — MAGNESIUM: MAGNESIUM: 1.4 mg/dL — AB (ref 1.7–2.4)

## 2015-11-17 LAB — RENAL FUNCTION PANEL
ANION GAP: 7 (ref 5–15)
Albumin: 1.5 g/dL — ABNORMAL LOW (ref 3.5–5.0)
BUN: 37 mg/dL — ABNORMAL HIGH (ref 6–20)
CALCIUM: 8.4 mg/dL — AB (ref 8.9–10.3)
CHLORIDE: 114 mmol/L — AB (ref 101–111)
CO2: 17 mmol/L — AB (ref 22–32)
Creatinine, Ser: 3.13 mg/dL — ABNORMAL HIGH (ref 0.61–1.24)
GFR calc non Af Amer: 19 mL/min — ABNORMAL LOW (ref >60)
GFR, EST AFRICAN AMERICAN: 22 mL/min — AB (ref >60)
GLUCOSE: 193 mg/dL — AB (ref 65–99)
POTASSIUM: 3.5 mmol/L (ref 3.5–5.1)
Phosphorus: 4.9 mg/dL — ABNORMAL HIGH (ref 2.5–4.6)
Sodium: 138 mmol/L (ref 135–145)

## 2015-11-17 LAB — URINE CULTURE
CULTURE: NO GROWTH
SPECIAL REQUESTS: NORMAL

## 2015-11-17 LAB — HEMOGLOBIN A1C
HEMOGLOBIN A1C: 10 % — AB (ref 4.8–5.6)
Mean Plasma Glucose: 240 mg/dL

## 2015-11-17 LAB — HEMOGLOBIN AND HEMATOCRIT, BLOOD
HEMATOCRIT: 25.3 % — AB (ref 39.0–52.0)
Hemoglobin: 7.9 g/dL — ABNORMAL LOW (ref 13.0–17.0)

## 2015-11-17 LAB — PROCALCITONIN: PROCALCITONIN: 4.84 ng/mL

## 2015-11-17 MED ORDER — DONEPEZIL HCL 10 MG PO TABS
10.0000 mg | ORAL_TABLET | Freq: Every day | ORAL | Status: DC
  Administered 2015-11-17 – 2015-11-22 (×6): 10 mg via ORAL
  Filled 2015-11-17 (×6): qty 1

## 2015-11-17 MED ORDER — FOLIC ACID 1 MG PO TABS
1.0000 mg | ORAL_TABLET | Freq: Every day | ORAL | Status: DC
  Administered 2015-11-17 – 2015-11-23 (×7): 1 mg via ORAL
  Filled 2015-11-17 (×7): qty 1

## 2015-11-17 MED ORDER — PERFLUTREN LIPID MICROSPHERE
1.0000 mL | INTRAVENOUS | Status: AC | PRN
  Administered 2015-11-17: 2 mL via INTRAVENOUS
  Filled 2015-11-17: qty 10

## 2015-11-17 MED ORDER — ASPIRIN EC 81 MG PO TBEC
81.0000 mg | DELAYED_RELEASE_TABLET | Freq: Every day | ORAL | Status: DC
  Administered 2015-11-17 – 2015-11-23 (×7): 81 mg via ORAL
  Filled 2015-11-17 (×7): qty 1

## 2015-11-17 MED ORDER — POTASSIUM CHLORIDE 10 MEQ/100ML IV SOLN
10.0000 meq | INTRAVENOUS | Status: AC
  Administered 2015-11-17 (×3): 10 meq via INTRAVENOUS
  Filled 2015-11-17: qty 100

## 2015-11-17 MED ORDER — VITAMIN B-1 100 MG PO TABS
100.0000 mg | ORAL_TABLET | Freq: Every day | ORAL | Status: DC
  Administered 2015-11-17 – 2015-11-23 (×7): 100 mg via ORAL
  Filled 2015-11-17 (×7): qty 1

## 2015-11-17 MED ORDER — ASPIRIN 325 MG PO TABS
325.0000 mg | ORAL_TABLET | Freq: Every day | ORAL | Status: DC
  Administered 2015-11-17: 325 mg via ORAL
  Filled 2015-11-17: qty 1

## 2015-11-17 MED ORDER — INSULIN GLARGINE 100 UNIT/ML ~~LOC~~ SOLN
10.0000 [IU] | Freq: Every day | SUBCUTANEOUS | Status: DC
  Administered 2015-11-17 – 2015-11-18 (×2): 10 [IU] via SUBCUTANEOUS
  Filled 2015-11-17 (×2): qty 0.1

## 2015-11-17 MED ORDER — ACETAMINOPHEN 325 MG PO TABS
650.0000 mg | ORAL_TABLET | Freq: Four times a day (QID) | ORAL | Status: DC | PRN
  Administered 2015-11-17 – 2015-11-19 (×5): 650 mg via ORAL
  Filled 2015-11-17 (×6): qty 2

## 2015-11-17 MED ORDER — METOPROLOL TARTRATE 50 MG PO TABS
50.0000 mg | ORAL_TABLET | Freq: Two times a day (BID) | ORAL | Status: DC
  Administered 2015-11-17 – 2015-11-18 (×3): 50 mg via ORAL
  Filled 2015-11-17 (×3): qty 1

## 2015-11-17 MED ORDER — MAGNESIUM SULFATE 2 GM/50ML IV SOLN
2.0000 g | Freq: Once | INTRAVENOUS | Status: AC
  Administered 2015-11-17: 2 g via INTRAVENOUS
  Filled 2015-11-17: qty 50

## 2015-11-17 NOTE — Plan of Care (Signed)
Problem: Pain Managment: Goal: General experience of comfort will improve Outcome: Not Progressing Pt is very sore  Problem: Activity: Goal: Risk for activity intolerance will decrease Outcome: Not Progressing Pt not moving much due to extreme soreness

## 2015-11-17 NOTE — Progress Notes (Signed)
prn BP meds given

## 2015-11-17 NOTE — Progress Notes (Signed)
PULMONARY / CRITICAL CARE MEDICINE   Name: Dillon Thomas MRN: [id number] DOB: 03-22-1950    ADMISSION DATE:  11/15/2015 CONSULTATION DATE:  10/29  REFERRING MD:  Eulas Post ([facility])   CHIEF COMPLAINT:  DKA, AMS   HISTORY OF PRESENT ILLNESS:   65yo male with hx uncontrolled DM, CKD 3, GERD, HTN, previous TIA presented 10/30 with 1 week hx progressive weakness then found this am by his girlfriend/caregiver with AMS, unable to speak or get up.  In ER was noted to have blood glucose >800, AKI with Scr 3.5, metabolic acidosis, HTN with SBP 200.  He was admitted by [facility] with DKA/HHNK but there was some concern for CVA and MRI was ordered.  In MRI pt had episode of emesis, unable to cooperate with study and PCCM consulted.   SUBJECTIVE: No acute events overnight. Encephalopathy has markedly improved. Patient denies any current or previous chest pain, pressure, or tightness. He reports he was having some mild nausea before admission but none since admission. Denies any abdominal pain. Denies any melena or hematochezia prior to admission. Patient does recall that he was reportedly admitted after a fall at home.  REVIEW OF SYSTEMS:  No neck pain or headache. No subjective fever or chills. No dyspnea or cough.  VITAL SIGNS: BP (!) 175/77   Pulse (!) 104   Temp 97.7 F (36.5 C) (Oral)   Resp (!) 21   Ht 6' (1.829 m)   Wt 220 lb (99.8 kg)   SpO2 98%   BMI 29.84 kg/m   HEMODYNAMICS:    VENTILATOR SETTINGS:    INTAKE / OUTPUT: I/O last 3 completed shifts: In: 7468.1 [P.O.:110; I.V.:3558.1; IV Piggyback:3800] Out: [id number] [Urine:3545]  PHYSICAL EXAMINATION: General:  No family at bedside. Awake. Watching TV. Integument:  Warm & dry. No rash on exposed skin.  HEENT:  Dry mucous membranes. Cervical collar in place. No scleral icterus. Cardiovascular:  Tachycardic but regular rhythm . Normal S1 & S2. No appreciable JVD.  Pulmonary:  Normal work of breathing on room air . Overall clear on  auscultation bilaterally. Abdomen: Soft. Normal bowel sounds. Nondistended.  Nontender. Neurological: alert and oriented to person, year, president, and place. Following commands. Cranial nerves grossly intact.  Musculoskeletal: No pain with palpation of cervical spine. No pain with active or passive cervical rotation, flexion, or extension.  LABS:  BMET  Recent Labs Lab 11/16/15 0914 11/16/15 1800 11/17/15 0220  NA 137 137 138  K 3.3* 3.8 3.5  CL 113* 114* 114*  CO2 17* 16* 17*  BUN 37* 37* 37*  CREATININE 3.04* 3.14* 3.13*  GLUCOSE 104* 169* 193*    Electrolytes  Recent Labs Lab 11/16/15 0914 11/16/15 1800 11/17/15 0220  CALCIUM 8.2* 8.3* 8.4*  MG  --   --  1.4*  PHOS  --   --  4.9*    CBC  Recent Labs Lab 11/15/15 1830  11/15/15 2328 11/16/15 1800 11/17/15 0220  WBC 13.7*  --  9.9  --  11.5*  HGB 9.0*  < > 8.5* 7.7* 7.6*  HCT 31.1*  < > 26.2* 23.6* 23.4*  PLT 399  --  335  --  299  < > = values in this interval not displayed.  Coag's  Recent Labs Lab 11/15/15 1830  APTT 33  INR 1.36    Sepsis Markers  Recent Labs Lab 11/15/15 2328 11/17/15 0220  PROCALCITON 3.56 4.84    ABG  Recent Labs Lab 11/15/15 2243  PHART 7.342*  PCO2ART  27.5*  PO2ART 78.0*    Liver Enzymes  Recent Labs Lab 11/15/15 1830 11/15/15 2328 11/17/15 0220  AST 23 29  --   ALT 14* 14*  --   ALKPHOS 94 82  --   BILITOT 0.6 0.3  --   ALBUMIN 1.9* 1.8* 1.5*    Cardiac Enzymes  Recent Labs Lab 11/16/15 0257 11/16/15 0630 11/16/15 0914  TROPONINI 5.34* 7.46* 7.46*    Glucose  Recent Labs Lab 11/16/15 1155 11/16/15 1547 11/16/15 1937 11/17/15 0030 11/17/15 0422 11/17/15 0759  GLUCAP 140* 160* 178* 186* 177* 191*    Imaging Mr Brain Wo Contrast  Result Date: 11/17/2015 CLINICAL DATA:  Progressive gait instability and speech disturbance. Obtunded. EXAM: MRI HEAD WITHOUT CONTRAST TECHNIQUE: Multiplanar, multiecho pulse sequences of the brain  and surrounding structures were obtained without intravenous contrast. COMPARISON:  Head CT 11/15/2015 and 01/09/2013 FINDINGS: Brain: Diffusion imaging does not show any acute or subacute infarction. The brainstem is normal. There are a few old small vessel cerebellar infarctions. Cerebral hemispheres show atrophy with mild chronic small-vessel ischemic changes of the deep white matter. There is an old infarction in the right caudate/ periventricular deep white matter with hemosiderin deposition. Ventricles are asymmetry with the right lateral ventricle being larger than the left, but this is felt to be largely normal variant, possibly with some contribution from the old periventricular infarction on the right. No evidence of intra-axial mass lesion, acute hemorrhage or extra-axial collection. The patient does show an extensive extra-axial mass along the planum sphenoidale with probable invasion of the cavernous sinuses and extension along the middle cranial fossae on the left medially. This is most likely represent an extensive meningioma. Postcontrast evaluation recommended when able. Vascular: Major vessels the base of the brain show flow. Skull and upper cervical spine: Chronic benign sclerotic focus in the inferior left frontal bone is unchanged and not significant. Hypercellular pattern of the clivus is correlated with the previous CT studies and is probably stable. This is probably a normal marrow pattern,, though it could possibly relate in some way to the meningioma. Sinuses/Orbits: Paranasal sinuses are clear. There are bilateral mastoid effusions. Other: None significant IMPRESSION: No evidence of acute infarction. Atrophy and chronic small-vessel ischemic changes. Ventricular asymmetry with the right lateral ventricle being more prominent is a chronic finding. Extensive extra-axial mass lesion along the floor of the anterior cranial fossa/ planum sphenoidale region most consistent with meningioma. This  may involve the cavernous sinuses and does extend along the medial aspect of the middle cranial fossa on the left. Postcontrast evaluation suggested when able. Indeterminate marrow pattern of the clivus. This can be re-evaluated after contrast administration. Electronically Signed   By: Nelson Chimes M.D.   On: 11/17/2015 07:27   Mr Cervical Spine Wo Contrast  Result Date: 11/17/2015 CLINICAL DATA:  Acute onset of altered mental status. Found unresponsive on the ground. EXAM: MRI CERVICAL SPINE WITHOUT CONTRAST TECHNIQUE: Multiplanar, multisequence MR imaging of the cervical spine was performed. No intravenous contrast was administered. COMPARISON:  MRI brain same day FINDINGS: The study suffers from considerable motion degradation. Alignment: Straightening of the normal cervical lordosis. Vertebrae: Abnormal marrow pattern of the clivus as discussed at brain imaging. This could represent benign hypercellular marrow, but re-evaluation after contrast administration is suggested, particularly given the finding of the skullbase meningioma at brain imaging. No marrow or focal bone lesion seen in the cervical region proper. Cord: No primary cord lesion. Spinal stenosis C4-5 causes some deformity of the  cord common Tower. Posterior Fossa, vertebral arteries, paraspinal tissues: Few old small vessel cerebellar infarctions. Vessels are patent. Disc levels: Foramen magnum and C1-2:  Wide patency. C2-3:  Wide patency. C3-4: Mild facet degeneration. Mild disc bulge. No significant stenosis. C4-5: Suspicion of ossification of the posterior longitudinal ligament. Spondylosis with endplate osteophytes and bulging disc material. Effacement of the subarachnoid space surrounding the cord without evidence of true cord compression. Detail is somewhat limited, but AP diameter of the canal probably measures 7.5 mm. There is bilateral foraminal stenosis. C5-6: Suspicion of ossification of the posterior longitudinal ligament.  Spondylosis with endplate osteophytes and protruding disc material. Effacement of the subarachnoid space anteriorly. AP diameter of the canal 8 mm. Bilateral foraminal stenosis could affect the C6 nerve roots. C6-7: Suspicion of 0PLL. Spondylosis. Effacement of the ventral subarachnoid space. No cord compression. Foraminal narrowing left more than right. C7-T1: Suspicion of 0PLL. Spondylosis. Narrowing of the ventral subarachnoid space but no cord compression. No foraminal stenosis. IMPRESSION: Motion degraded exam. Suspicion of ossification of the posterior longitudinal ligament from C4 through C7. This is not definite and could represent changes associated with spondylosis. In combination with spondylosis, there is canal narrowing from C4-5 through C7-T1, most pronounced at C4-5, C5-6 and C6-7. AP diameter of canal probably measures between 7 and 9 mm throughout that region. There is effacement of the subarachnoid space surrounding the cord in that region. Foraminal stenosis at C4-5, C5-6 and C6-7. Re- demonstration of indeterminate marrow pattern of the clivus. See above discussion. This should be re-evaluated at postcontrast brain imaging as previously discussed. Electronically Signed   By: Nelson Chimes M.D.   On: 11/17/2015 07:35     STUDIES:  CT HEAD W/O 10/29:  Atrophy and small vessel disease. No acute intracranial findings are evident. ASPECTS is 10. EEG 10/30:  Abnormal EEG demonstrating a mild diffuse slowing of electrocerebral activity.  This can be seen in a wide variety of encephalopathic state including those of a toxic, metabolic, or degenerative nature.  There were no focal, hemispheric, or lateralizing features.  No epileptiform activity was recorded. MRI Brain W/O 10/31:  No evidence of acute infarction. Atrophy and chronic small-vessel ischemic changes. Ventricular asymmetry with the right lateral ventricle being more prominent is a chronic finding. Extensive extra-axial mass lesion along  the floor of the anterior cranial fossa/ planum sphenoidale region most consistent with meningioma. This may involve the cavernous sinuses and does extend along the medial aspect of the middle cranial fossa on the left. Postcontrast evaluation suggested when able. Indeterminate marrow pattern of the clivus. This can be re-evaluated after contrast administration. MRI C-Spine 10/31:  Motion degraded exam. Suspicion of ossification of the posterior longitudinal ligament from C4 through C7. This is not definite and could represent changes associated with spondylosis. In combination with spondylosis, there is canal narrowing from C4-5 through C7-T1, most pronounced at C4-5, C5-6 and C6-7. AP diameter of canal probably measures between 7 and 9 mm throughout that region. There is effacement of the subarachnoid space surrounding the cord in that region. Foraminal stenosis at C4-5, C5-6 and C6-7. Re- demonstration of indeterminate marrow pattern of the clivus. This should be re-evaluated at postcontrast brain imaging as previously discussed.  MICROBIOLOGY: MRSA PCR 10/30:  Negative  Blood Ctx x2 10/30 >> Urine Ctx x2 10/30 >>  ANTIBIOTICS: None  SIGNIFICANT EVENTS: 10/29 - Admit after found down.  LINES/TUBES: Foley >> PIV x2  ASSESSMENT / PLAN:  ENDOCRINE A: HHNK - Resolved. H/O DM  Type 2 - Poorly controlled at baseline. A1c 10.0. H/O Hypothyroidism - TSH 2.954 & Free T4 1.02.  P:   Stopping IV Synthroid Plan to clarify Synthroid with care-giver because not on medicine reconciliation  Holding home Humalog 75/25 & Trajenta Accu-Checks q4hr while mostly NPO SSI per Moderate Algorithm  Starting Lantus 10u [place] daily  CARDIOVASCULAR A: Elevated Troponin I - NSTEMI. Essential Hypertension - Poorly controlled.  P:  Cardiology Consulted - Appreciate recommendations Vitals per unit protocol Continuous telemetry monitoring Complete Echocardiogram pending Labetalol & Hydralazine IV prn  hypertension Lopressor 25mg  po bid Crestor 20mg  qhs Brilinta PO bid Changing to Aspirin 325mg  po daily Holding home Monopril, Crestor, & Toprol XL Likely will need Left Heart Catheterization as renal function recovers and before home discharge  NEUROLOGIC A: Acute Encephalopathy - Multifactorial. Resolved. No sign of focal injury on MRI Brain w/o contrast. Possible Seizure - At home. Baseline speech & gait disturbances. EEG w/o focal abnormality. Probable Meningioma - Seen on Brain MRI w/o contrast. H/O TIA/CVA  P:   Seizure Precautions Avoiding sedating medications Tylenol prn pain Neurology previously consulted Holding home Wellbutrin XL Thiamine & Folic Acid PO daily Starting home Aricept 10mg  qhs Plan for MRI Brain W/ Contrast for evaluation of probable meningioma  Consulting PT/OT  RENAL A: Acute on Chronic Renal Failure Stage II - Stable. Baseline creatinine 1.5-1.9. Hypomagnesemia - Replaced. Metabolic Acidosis - Mild.  Hypokalemia - Resolved. Hyponatremia - Resolved.  P:   Trending UOP with Foley May be able to discontinue foley as he becomes more mobile Monitoring electrolytes & renal function daily Replacing electrolytes as indicated LR @ 125cc/hr MIVF Magnesium Sulfate 2gm IV now KCl 49mEq IV x3 runs   GASTROINTESTINAL A: Questionable GIB - None seen during admission. H/O GERD  P:   Diet per Speech therapy - Medications with puree and ice chips. Protonix IV q12hr Holding home Zantac  PULMONARY A: Possible Aspiration  P:   Continuous Pulse Oximetry   HEMATOLOGIC A: Anemia - No signs of active bleeding. Hgb stable. Has know chronic anemia but last Hgb 11.2 03/04/15. Leukocytosis - Mild. Likely reactive without fever.  P:  Trending cell counts daily w/ CBC Repeat Hgb again today @ 1700 SCDs Heparin  q8hr FOBT pending Transfuse for Hgb <8.0 consistently or signs of active bleeding  INFECTIOUS A: No obvious source infection   P:    Holding off on antibiotics at this time Awaiting Blood & Urine Cultures Trending Procalcitonin per algorithm  MUSCULOSKELETAL A: Possible Fall - Found down. MRI without fracture.   A: Cervical Collar removed after cleared by Dr. Ashok Cordia  FAMILY  - Updates:  Daughter updated 10/30 by Dr. Ashok Cordia via phone.  - Inter-disciplinary family meet or Palliative Care meeting due by:  11/6  TODAY'S SUMMARY:  65 y.o. male chronically ill male with uncontrolled DM, CKD, HTN admitted 10/30 with HHNK and acute on chronic renal failure. Patient has no evidence of focal abnormality on MRI of the brain that would account for his fall and no pain with movement of his neck therefore his cervical collar was removed. His glucose is better controlled and he still demonstrates no evidence of anion gap. Given his persistent mild hyperglycemia and insulin requirement I am starting him on basal insulin with Lantus. I appreciate the assistance from cardiology as we further titrate his antihypertensive regimen and continue his antiplatelet regimen for his non-ST elevation MI. Awaiting transthoracic echocardiogram for a better evaluation of his myocardial function and potential  wall abnormalities. His renal function seems to be largely stable and I will continue him on lactated Ringer's IV fluid for further hydration. I am repeating a hemoglobin/hematocrit at 1700 hrs. today as the patient may require a transfusion. He has no evidence of any ongoing bleeding at this time. Given patient's continued clinical stability and resolution of his encephalopathy which was likely metabolic in nature I am transferring him to a stepdown unit.-  [facility] to assume care & PCCM off as of 11/1.   Sonia Baller Ashok Cordia, M.D. [facility] Pulmonary & Critical Care Pager:  [phone] After 3pm or if no response, call [phone] 11/17/2015 8:15 AM

## 2015-11-17 NOTE — Progress Notes (Signed)
Subjective:  Resting comfortably in bed. Alert but does recall events that occurred yesterday. Denies any chest pain or SOB.   Objective:  Vital Signs in the last 24 hours: Temp:  [97.5 F (36.4 C)-99.4 F (37.4 C)] 97.7 F (36.5 C) (10/31 0700) Pulse Rate:  [90-112] 104 (10/31 0700) Resp:  [15-27] 21 (10/31 0700) BP: (140-177)/(60-82) 175/77 (10/31 0700) SpO2:  [96 %-100 %] 98 % (10/31 0700)  Intake/Output from previous day: 10/30 0701 - 10/31 0700 In: 3574.6 [P.O.:110; I.V.:2964.6; IV Piggyback:500] Out: 2160 [Urine:2160]  Physical Exam: General appearance: alert, cooperative, appears stated age, no distress  Lungs: clear to auscultation bilaterally Heart: regular rate and rhythm, S1, S2 normal, no murmur, click, rub or gallop Extremities: extremities normal, atraumatic, no cyanosis or edema Pulses: 2+ and symmetric Neurologic: Grossly normal   Lab Results: BMP  Recent Labs  11/16/15 0914 11/16/15 1800 11/17/15 0220  NA 137 137 138  K 3.3* 3.8 3.5  CL 113* 114* 114*  CO2 17* 16* 17*  GLUCOSE 104* 169* 193*  BUN 37* 37* 37*  CREATININE 3.04* 3.14* 3.13*  CALCIUM 8.2* 8.3* 8.4*  GFRNONAA 20* 19* 19*  GFRAA 23* 22* 22*    CBC  Recent Labs Lab 11/17/15 0220  WBC 11.5*  RBC 2.70*  HGB 7.6*  HCT 23.4*  PLT 299  MCV 86.7  MCH 28.1  MCHC 32.5  RDW 14.7  LYMPHSABS 3.0  MONOABS 1.9*  EOSABS 0.0  BASOSABS 0.0    HEMOGLOBIN A1C Lab Results  Component Value Date   HGBA1C 10.0 (H) 11/16/2015   MPG 240 11/16/2015    Cardiac Panel (last 3 results)  Recent Labs  11/16/15 0257 11/16/15 0630 11/16/15 0914  TROPONINI 5.34* 7.46* 7.46*    BNP (last 3 results) No results for input(s): PROBNP in the last 8760 hours.  TSH  Recent Labs  11/16/15 0257  TSH 2.954    CHOLESTEROL  Recent Labs  11/16/15 1218  CHOL 129    Hepatic Function Panel  Recent Labs  11/15/15 1830 11/15/15 2328 11/17/15 0220  PROT 7.2 6.9  --   ALBUMIN 1.9*  1.8* 1.5*  AST 23 29  --   ALT 14* 14*  --   ALKPHOS 94 82  --   BILITOT 0.6 0.3  --     Imaging: Mr Brain Wo Contrast  Result Date: 11/17/2015 CLINICAL DATA:  Progressive gait instability and speech disturbance. Obtunded. EXAM: MRI HEAD WITHOUT CONTRAST TECHNIQUE: Multiplanar, multiecho pulse sequences of the brain and surrounding structures were obtained without intravenous contrast. COMPARISON:  Head CT 11/15/2015 and 01/09/2013 FINDINGS: Brain: Diffusion imaging does not show any acute or subacute infarction. The brainstem is normal. There are a few old small vessel cerebellar infarctions. Cerebral hemispheres show atrophy with mild chronic small-vessel ischemic changes of the deep white matter. There is an old infarction in the right caudate/ periventricular deep white matter with hemosiderin deposition. Ventricles are asymmetry with the right lateral ventricle being larger than the left, but this is felt to be largely normal variant, possibly with some contribution from the old periventricular infarction on the right. No evidence of intra-axial mass lesion, acute hemorrhage or extra-axial collection. The patient does show an extensive extra-axial mass along the planum sphenoidale with probable invasion of the cavernous sinuses and extension along the middle cranial fossae on the left medially. This is most likely represent an extensive meningioma. Postcontrast evaluation recommended when able. Vascular: Major vessels the base of the brain show flow.  Skull and upper cervical spine: Chronic benign sclerotic focus in the inferior left frontal bone is unchanged and not significant. Hypercellular pattern of the clivus is correlated with the previous CT studies and is probably stable. This is probably a normal marrow pattern,, though it could possibly relate in some way to the meningioma. Sinuses/Orbits: Paranasal sinuses are clear. There are bilateral mastoid effusions. Other: None significant  IMPRESSION: No evidence of acute infarction. Atrophy and chronic small-vessel ischemic changes. Ventricular asymmetry with the right lateral ventricle being more prominent is a chronic finding. Extensive extra-axial mass lesion along the floor of the anterior cranial fossa/ planum sphenoidale region most consistent with meningioma. This may involve the cavernous sinuses and does extend along the medial aspect of the middle cranial fossa on the left. Postcontrast evaluation suggested when able. Indeterminate marrow pattern of the clivus. This can be re-evaluated after contrast administration. Electronically Signed   By: Nelson Chimes M.D.   On: 11/17/2015 07:27   Mr Cervical Spine Wo Contrast  Result Date: 11/17/2015 CLINICAL DATA:  Acute onset of altered mental status. Found unresponsive on the ground. EXAM: MRI CERVICAL SPINE WITHOUT CONTRAST TECHNIQUE: Multiplanar, multisequence MR imaging of the cervical spine was performed. No intravenous contrast was administered. COMPARISON:  MRI brain same day FINDINGS: The study suffers from considerable motion degradation. Alignment: Straightening of the normal cervical lordosis. Vertebrae: Abnormal marrow pattern of the clivus as discussed at brain imaging. This could represent benign hypercellular marrow, but re-evaluation after contrast administration is suggested, particularly given the finding of the skullbase meningioma at brain imaging. No marrow or focal bone lesion seen in the cervical region proper. Cord: No primary cord lesion. Spinal stenosis C4-5 causes some deformity of the cord common Tower. Posterior Fossa, vertebral arteries, paraspinal tissues: Few old small vessel cerebellar infarctions. Vessels are patent. Disc levels: Foramen magnum and C1-2:  Wide patency. C2-3:  Wide patency. C3-4: Mild facet degeneration. Mild disc bulge. No significant stenosis. C4-5: Suspicion of ossification of the posterior longitudinal ligament. Spondylosis with endplate  osteophytes and bulging disc material. Effacement of the subarachnoid space surrounding the cord without evidence of true cord compression. Detail is somewhat limited, but AP diameter of the canal probably measures 7.5 mm. There is bilateral foraminal stenosis. C5-6: Suspicion of ossification of the posterior longitudinal ligament. Spondylosis with endplate osteophytes and protruding disc material. Effacement of the subarachnoid space anteriorly. AP diameter of the canal 8 mm. Bilateral foraminal stenosis could affect the C6 nerve roots. C6-7: Suspicion of 0PLL. Spondylosis. Effacement of the ventral subarachnoid space. No cord compression. Foraminal narrowing left more than right. C7-T1: Suspicion of 0PLL. Spondylosis. Narrowing of the ventral subarachnoid space but no cord compression. No foraminal stenosis. IMPRESSION: Motion degraded exam. Suspicion of ossification of the posterior longitudinal ligament from C4 through C7. This is not definite and could represent changes associated with spondylosis. In combination with spondylosis, there is canal narrowing from C4-5 through C7-T1, most pronounced at C4-5, C5-6 and C6-7. AP diameter of canal probably measures between 7 and 9 mm throughout that region. There is effacement of the subarachnoid space surrounding the cord in that region. Foraminal stenosis at C4-5, C5-6 and C6-7. Re- demonstration of indeterminate marrow pattern of the clivus. See above discussion. This should be re-evaluated at postcontrast brain imaging as previously discussed. Electronically Signed   By: Nelson Chimes M.D.   On: 11/17/2015 07:35   Dg Chest Portable 1 View  Result Date: 11/15/2015 CLINICAL DATA:  Altered mental status.  The  patient was found down. EXAM: PORTABLE CHEST 1 VIEW COMPARISON:  01/09/2013 FINDINGS: The heart size and mediastinal contours are within normal limits. Both lungs are clear. The visualized skeletal structures are unremarkable. IMPRESSION: Normal exam.  Electronically Signed   By: Lorriane Shire M.D.   On: 11/15/2015 19:11   Ct Head Code Stroke Wo Contrast`  Result Date: 11/15/2015 CLINICAL DATA:  Code stroke. Altered mental status. Not using RIGHT side. EXAM: CT HEAD WITHOUT CONTRAST TECHNIQUE: Contiguous axial images were obtained from the base of the skull through the vertex without intravenous contrast. COMPARISON:  CT head 01/09/2013. FINDINGS: Brain: Advanced atrophy with chronic microvascular ischemic change. No visible hemorrhage, mass lesion, hydrocephalus, or extra-axial fluid. Vascular: No hyperdense vessel or parenchymal calcification. Advanced vascular calcification in the carotid siphon and distal vertebral arteries. No features suggestive of large vessel occlusion. Chronic periventricular deep white matter infarct, RIGHT parietal lobe. Skull: Normal. Negative for fracture or focal lesion. Sinuses/Orbits: No acute finding. Other: None ASPECTS ([facility] Stroke Program Early CT Score) - Ganglionic level infarction (caudate, lentiform nuclei, internal capsule, insula, M1-M3 cortex): 7 - Supraganglionic infarction (M4-M6 cortex): 3 Total score (0-10 with 10 being normal): 10 IMPRESSION: 1. Atrophy and small vessel disease. No acute intracranial findings are evident. 2. ASPECTS is 10. These results were called by telephone at the time of interpretation on 11/15/2015 at 7:25 pm to Dr. Leonard Schwartz , who verbally acknowledged these results. Electronically Signed   By: Staci Righter M.D.   On: 11/15/2015 19:30    Cardiac Studies:  EKG: Sinus tachycardia otherwise no signal Changes to suggest ischemia or infarct.  Echo: Pending  Assessment/Plan:  1. NSTEMI 2. Diabetic ketoacidosis with diabetic pre-coma 3. Hypertension 4. Acute on chronic diabetic kidney disease, stage III no stage IV with eGFR 19 mL 5. Hyperlipidemia 6. Chronic anemia 7. H/O TIA/Stroke  Recommendation: Continue medical therapy for now, no improvement in renal function.  Will increase BB for improved HR and BP control. Echo pending.  Rachel Bo, NP-C 11/17/2015, 8:53 AM [facility] Pager: [phone] Office: [phone]

## 2015-11-17 NOTE — Progress Notes (Signed)
Speech Language Pathology Treatment: Dysphagia  Patient Details Name: Dillon Thomas MRN: [id number] DOB: 1950-09-08 Today's Date: 11/17/2015 Time: [id number] SLP Time Calculation (min) (ACUTE ONLY): 16 min  Assessment / Plan / Recommendation Clinical Impression  Pt presented with slightly hoarse vocal quality. He consumed trials of thin liquids with no overt s/s of aspiration. Given thin liquids via straw, pt displayed intermittent immediate throat clearing, but vocal quality remained clear. No difficulty was observed with pureed or regular solids. Recommend regular diet with thin liquids by cup only. Will f/u an additional time for diet tolerance.   HPI HPI: 65 yo male with hx uncontrolled DM, CKD 3, GERD, HTN, previous TIA, early dementia presented 10/30 with 1 week hx progressive weakness then found this am by his girlfriend/caregiver with AMS, unable to speak or get up. In ER was noted to have blood glucose >800, AKI with Scr 3.5, metabolic acidosis, HTN with SBP 200. He was admitted by [facility] with DKA/HHNK but there was some concern for CVA and MRI was ordered. In MRI pt had episode of emesis, study was aborted.       SLP Plan  Continue with current plan of care     Recommendations  Diet recommendations: Regular;Thin liquid Liquids provided via: No straw;Cup Medication Administration: Whole meds with puree Supervision: Patient able to self feed;Full supervision/cueing for compensatory strategies Compensations: Minimize environmental distractions;Slow rate;Small sips/bites Postural Changes and/or Swallow Maneuvers: Seated upright 90 degrees;Upright 30-60 min after meal                Oral Care Recommendations: Oral care BID Follow up Recommendations: Other (comment) (tba) Plan: Continue with current plan of care       [place], Student SLP  Shela Leff 11/17/2015, 10:20 AM

## 2015-11-17 NOTE — Progress Notes (Signed)
[facility] ADULT ICU REPLACEMENT PROTOCOL FOR AM LAB REPLACEMENT ONLY  The patient does not apply for the [facility] Adult ICU Electrolyte Replacment Protocol based on the criteria listed below:    Is GFR >/= 40 ml/min? No.  Patient's GFR today is 19  Abnormal electrolyte(s): K 3.5   If a panic level lab has been reported, has the CCM MD in charge been notified? Yes.  .   Physician:  Hale Bogus, MD  Vear Clock 11/17/2015 4:36 AM

## 2015-11-17 NOTE — Progress Notes (Signed)
Echocardiogram 2D Echocardiogram has been performed with definity.  Aggie Cosier 11/17/2015, 4:39 PM

## 2015-11-17 NOTE — Progress Notes (Signed)
Patient tx'd to room 3S06, placed on tele, receiving RN at bedside, no questions from patient or RN.  Rowe Pavy, RN

## 2015-11-17 NOTE — Progress Notes (Signed)
MRI brain showed no etiology for his right sided weakness. MRI cervical spine does show degenerative changes and would recommend neurosurgery to evaluate film and give input. EEG showed no epileptiform activity. Symptoms likely secondary to his significant gyperglycemia.  No further work up recommended per neurology. Neurology will S/O.  Discussed with Dr. Judith Part PA-C [facility] [phone]  M-F  (am- 4 PM)  11/17/2015, 9:41 AM

## 2015-11-17 NOTE — Progress Notes (Signed)
eLink Physician-Brief Progress Note Patient Name: Dillon Thomas DOB: October 07, 1950 MRN: [id number]   Date of Service  11/17/2015  HPI/Events of Note  Nurse calls has patient is complaining of pain.  Patient admitted to the altered mental status related to metabolic encephalopathy, clinically improved.  More awake now. Following commands. Complaining of pain related to fall.   170/70, 102, 20, 98% on    eICU Interventions  Trial of Tylenol when necessary per [place].  Aspiration precaution.         [place] 11/17/2015, 4:22 AM

## 2015-11-18 DIAGNOSIS — E1111 Type 2 diabetes mellitus with ketoacidosis with coma: Secondary | ICD-10-CM

## 2015-11-18 DIAGNOSIS — E1101 Type 2 diabetes mellitus with hyperosmolarity with coma: Secondary | ICD-10-CM

## 2015-11-18 DIAGNOSIS — E1311 Other specified diabetes mellitus with ketoacidosis with coma: Secondary | ICD-10-CM

## 2015-11-18 DIAGNOSIS — S51012A Laceration without foreign body of left elbow, initial encounter: Secondary | ICD-10-CM

## 2015-11-18 DIAGNOSIS — M47812 Spondylosis without myelopathy or radiculopathy, cervical region: Secondary | ICD-10-CM

## 2015-11-18 DIAGNOSIS — D62 Acute posthemorrhagic anemia: Secondary | ICD-10-CM

## 2015-11-18 DIAGNOSIS — Z8673 Personal history of transient ischemic attack (TIA), and cerebral infarction without residual deficits: Secondary | ICD-10-CM

## 2015-11-18 DIAGNOSIS — E1165 Type 2 diabetes mellitus with hyperglycemia: Secondary | ICD-10-CM

## 2015-11-18 DIAGNOSIS — IMO0002 Reserved for concepts with insufficient information to code with codable children: Secondary | ICD-10-CM

## 2015-11-18 DIAGNOSIS — R739 Hyperglycemia, unspecified: Secondary | ICD-10-CM

## 2015-11-18 DIAGNOSIS — S51012D Laceration without foreign body of left elbow, subsequent encounter: Secondary | ICD-10-CM

## 2015-11-18 DIAGNOSIS — I214 Non-ST elevation (NSTEMI) myocardial infarction: Secondary | ICD-10-CM

## 2015-11-18 DIAGNOSIS — E1142 Type 2 diabetes mellitus with diabetic polyneuropathy: Secondary | ICD-10-CM

## 2015-11-18 DIAGNOSIS — D72820 Lymphocytosis (symptomatic): Secondary | ICD-10-CM

## 2015-11-18 DIAGNOSIS — E039 Hypothyroidism, unspecified: Secondary | ICD-10-CM

## 2015-11-18 DIAGNOSIS — I10 Essential (primary) hypertension: Secondary | ICD-10-CM

## 2015-11-18 DIAGNOSIS — D1800 Hemangioma unspecified site: Secondary | ICD-10-CM

## 2015-11-18 LAB — CBC WITH DIFFERENTIAL/PLATELET
Basophils Absolute: 0 10*3/uL (ref 0.0–0.1)
Basophils Relative: 0 %
EOS PCT: 0 %
Eosinophils Absolute: 0 10*3/uL (ref 0.0–0.7)
HCT: 24 % — ABNORMAL LOW (ref 39.0–52.0)
HEMOGLOBIN: 7.7 g/dL — AB (ref 13.0–17.0)
LYMPHS ABS: 3.2 10*3/uL (ref 0.7–4.0)
LYMPHS PCT: 28 %
MCH: 27.9 pg (ref 26.0–34.0)
MCHC: 32.1 g/dL (ref 30.0–36.0)
MCV: 87 fL (ref 78.0–100.0)
MONOS PCT: 15 %
Monocytes Absolute: 1.7 10*3/uL — ABNORMAL HIGH (ref 0.1–1.0)
Neutro Abs: 6.4 10*3/uL (ref 1.7–7.7)
Neutrophils Relative %: 57 %
PLATELETS: 337 10*3/uL (ref 150–400)
RBC: 2.76 MIL/uL — AB (ref 4.22–5.81)
RDW: 14.9 % (ref 11.5–15.5)
WBC: 11.3 10*3/uL — AB (ref 4.0–10.5)

## 2015-11-18 LAB — RENAL FUNCTION PANEL
Albumin: 1.6 g/dL — ABNORMAL LOW (ref 3.5–5.0)
Anion gap: 8 (ref 5–15)
BUN: 41 mg/dL — ABNORMAL HIGH (ref 6–20)
CHLORIDE: 111 mmol/L (ref 101–111)
CO2: 16 mmol/L — ABNORMAL LOW (ref 22–32)
Calcium: 8.5 mg/dL — ABNORMAL LOW (ref 8.9–10.3)
Creatinine, Ser: 3.2 mg/dL — ABNORMAL HIGH (ref 0.61–1.24)
GFR, EST AFRICAN AMERICAN: 22 mL/min — AB (ref >60)
GFR, EST NON AFRICAN AMERICAN: 19 mL/min — AB (ref >60)
Glucose, Bld: 229 mg/dL — ABNORMAL HIGH (ref 65–99)
POTASSIUM: 3.8 mmol/L (ref 3.5–5.1)
Phosphorus: 5.1 mg/dL — ABNORMAL HIGH (ref 2.5–4.6)
Sodium: 135 mmol/L (ref 135–145)

## 2015-11-18 LAB — GLUCOSE, CAPILLARY
GLUCOSE-CAPILLARY: 320 mg/dL — AB (ref 65–99)
Glucose-Capillary: 232 mg/dL — ABNORMAL HIGH (ref 65–99)
Glucose-Capillary: 192 mg/dL — ABNORMAL HIGH (ref 65–99)
Glucose-Capillary: 255 mg/dL — ABNORMAL HIGH (ref 65–99)
Glucose-Capillary: 280 mg/dL — ABNORMAL HIGH (ref 65–99)

## 2015-11-18 LAB — PROCALCITONIN: Procalcitonin: 3.3 ng/mL

## 2015-11-18 LAB — MAGNESIUM: Magnesium: 1.8 mg/dL (ref 1.7–2.4)

## 2015-11-18 MED ORDER — PANTOPRAZOLE SODIUM 40 MG PO TBEC
40.0000 mg | DELAYED_RELEASE_TABLET | Freq: Two times a day (BID) | ORAL | Status: DC
  Administered 2015-11-18 – 2015-11-20 (×5): 40 mg via ORAL
  Filled 2015-11-18 (×5): qty 1

## 2015-11-18 MED ORDER — FUROSEMIDE 10 MG/ML IJ SOLN
40.0000 mg | Freq: Once | INTRAMUSCULAR | Status: AC
  Administered 2015-11-18: 40 mg via INTRAVENOUS
  Filled 2015-11-18: qty 4

## 2015-11-18 MED ORDER — INSULIN ASPART 100 UNIT/ML ~~LOC~~ SOLN
0.0000 [IU] | Freq: Three times a day (TID) | SUBCUTANEOUS | Status: DC
  Administered 2015-11-18: 5 [IU] via SUBCUTANEOUS
  Administered 2015-11-19: 1 [IU] via SUBCUTANEOUS
  Administered 2015-11-19: 5 [IU] via SUBCUTANEOUS
  Administered 2015-11-20 (×2): 2 [IU] via SUBCUTANEOUS
  Administered 2015-11-21: 3 [IU] via SUBCUTANEOUS
  Administered 2015-11-21: 2 [IU] via SUBCUTANEOUS
  Administered 2015-11-22: 7 [IU] via SUBCUTANEOUS
  Administered 2015-11-22 (×2): 2 [IU] via SUBCUTANEOUS
  Administered 2015-11-23: 1 [IU] via SUBCUTANEOUS
  Administered 2015-11-23: 3 [IU] via SUBCUTANEOUS
  Administered 2015-11-23: 5 [IU] via SUBCUTANEOUS

## 2015-11-18 MED ORDER — CARVEDILOL 6.25 MG PO TABS
6.2500 mg | ORAL_TABLET | Freq: Two times a day (BID) | ORAL | Status: DC
  Administered 2015-11-18 – 2015-11-19 (×2): 6.25 mg via ORAL
  Filled 2015-11-18 (×3): qty 1

## 2015-11-18 MED ORDER — INSULIN GLARGINE 100 UNIT/ML ~~LOC~~ SOLN
10.0000 [IU] | Freq: Two times a day (BID) | SUBCUTANEOUS | Status: DC
  Administered 2015-11-18 – 2015-11-23 (×11): 10 [IU] via SUBCUTANEOUS
  Filled 2015-11-18 (×12): qty 0.1

## 2015-11-18 MED ORDER — DONEPEZIL HCL 10 MG PO TABS: 10.0000 mg | ORAL_TABLET | Freq: Every day | ORAL | Status: DC

## 2015-11-18 MED ORDER — HYDRALAZINE HCL 20 MG/ML IJ SOLN
10.0000 mg | INTRAMUSCULAR | Status: DC | PRN
  Administered 2015-11-18 – 2015-11-22 (×3): 10 mg via INTRAVENOUS
  Filled 2015-11-18 (×3): qty 1

## 2015-11-18 MED ORDER — MONTELUKAST SODIUM 10 MG PO TABS
10.0000 mg | ORAL_TABLET | Freq: Every day | ORAL | Status: DC
  Administered 2015-11-18 – 2015-11-22 (×5): 10 mg via ORAL
  Filled 2015-11-18 (×5): qty 1

## 2015-11-18 MED ORDER — INSULIN ASPART 100 UNIT/ML ~~LOC~~ SOLN
3.0000 [IU] | Freq: Three times a day (TID) | SUBCUTANEOUS | Status: DC
  Administered 2015-11-18 – 2015-11-23 (×13): 3 [IU] via SUBCUTANEOUS

## 2015-11-18 MED ORDER — INSULIN ASPART 100 UNIT/ML ~~LOC~~ SOLN
0.0000 [IU] | Freq: Every day | SUBCUTANEOUS | Status: DC
  Administered 2015-11-18 – 2015-11-19 (×2): 3 [IU] via SUBCUTANEOUS
  Administered 2015-11-22: 2 [IU] via SUBCUTANEOUS

## 2015-11-18 MED ORDER — LABETALOL HCL 5 MG/ML IV SOLN
10.0000 mg | INTRAVENOUS | Status: DC | PRN
  Filled 2015-11-18: qty 4

## 2015-11-18 NOTE — Evaluation (Addendum)
Occupational Therapy Evaluation Patient Details Name: Dillon Thomas MRN: [id number] DOB: August 11, 1950 Today's Date: 11/18/2015    History of Present Illness Dillon Thomas Mooreis a 65 y.o.gentleman with a history of uncontrolled type 2 DM, CKD 3, peripheral neuropathy, prior TIA, GERD, and hypothyroidism who has had progressive gait instability ("shuffling gait" per his significant other), back pain, and speech difficulties who was last known to be in his baseline state of health around 11:30AM this morning. Significant other found him down and unable to speak at home at 5p that evening, incontinent of urine.    Clinical Impression   Pt reports he was independent with ADL PTA. Currently pt min assist +2 for functional mobility and min assist overall for ADL. Pt with pain in L side (?ribs), weakness, and poor standing balance limiting his independence and safety with ADL and functional mobility at this time. Recommending CIR level therapies to maximize independence and safety with ADL and functional mobility prior to return home. Pt would benefit from continued skilled OT to address established goals.   Follow Up Recommendations  CIR;Supervision/Assistance - 24 hour    Equipment Recommendations  3 in 1 bedside comode    Recommendations for Other Services Rehab consult     Precautions / Restrictions Precautions Precautions: Fall Restrictions Weight Bearing Restrictions: No      Mobility Bed Mobility               General bed mobility comments: Pt OOB in chair upon arrival.  Transfers Overall transfer level: Needs assistance Equipment used: Rolling walker (2 wheeled) Transfers: Sit to/from Stand Sit to Stand: Mod assist;+2 physical assistance         General transfer comment: Mod assist provided on pts R side; unable to provide physical assist on L due to pain.    Balance Overall balance assessment: Needs assistance Sitting-balance support: Feet supported;Bilateral  upper extremity supported Sitting balance-Leahy Scale: Poor     Standing balance support: Bilateral upper extremity supported Standing balance-Leahy Scale: Poor Standing balance comment: RW for support                            ADL Overall ADL's : Needs assistance/impaired Eating/Feeding: Set up;Sitting   Grooming: Set up;Supervision/safety;Sitting   Upper Body Bathing: Minimal assitance;Sitting   Lower Body Bathing: Minimal assistance;Sit to/from stand   Upper Body Dressing : Minimal assistance;Sitting   Lower Body Dressing: Minimal assistance;Sit to/from stand Lower Body Dressing Details (indicate cue type and reason): Limited by pain in L side. Toilet Transfer: Minimal assistance;Ambulation;BSC;RW;+2 for safety/equipment Toilet Transfer Details (indicate cue type and reason): Simulated by sit to stand from chair with functional mobility.         Functional mobility during ADLs: Minimal assistance;+2 for safety/equipment;Rolling walker General ADL Comments: Pt noted to be limiting use of LUE due to pain in L side with movement; encouraged functional use of LUE as tolerated and performing gentle AROM to shoulder throughout day.      Vision Vision Assessment?: No apparent visual deficits   Perception     Praxis      Pertinent Vitals/Pain Pain Assessment: Faces Faces Pain Scale: Hurts whole lot Pain Location: L side, ?ribs Pain Descriptors / Indicators: Grimacing;Guarding;Aching Pain Intervention(s): Monitored during session     Hand Dominance Right   Extremity/Trunk Assessment Upper Extremity Assessment Upper Extremity Assessment: RUE deficits/detail;LUE deficits/detail RUE Deficits / Details: Full ROM. Slightly decreased strength, grossly 4/5. LUE Deficits /  Details: Pt with full ROM but limited assessment of strength due to L sided pain. LUE: Unable to fully assess due to pain   Lower Extremity Assessment Lower Extremity Assessment: Defer to PT  evaluation       Communication Communication Communication: No difficulties   Cognition Arousal/Alertness: Awake/alert Behavior During Therapy: Flat affect;WFL for tasks assessed/performed Overall Cognitive Status: Within Functional Limits for tasks assessed                     General Comments       Exercises       Shoulder Instructions      Home Living Family/patient expects to be discharged to:: Private residence Living Arrangements: Alone Available Help at Discharge: Family;Friend(s);Available PRN/intermittently Type of Home: House Home Access: Stairs to enter [employer] of Steps: 3   Home Layout: One level     Bathroom Shower/Tub: Occupational psychologist: Standard     Home Equipment: None          Prior Functioning/Environment Level of Independence: Independent        Comments: Drives. Does not use an AD for mobility but states that he should. Independent with ADL but does have difficulty getting off of low toilet.        OT Problem List: Decreased strength;Decreased activity tolerance;Impaired balance (sitting and/or standing);Decreased safety awareness;Decreased knowledge of use of DME or AE;Decreased knowledge of precautions;Obesity;Pain;Impaired UE functional use   OT Treatment/Interventions: Self-care/ADL training;Therapeutic exercise;Energy conservation;DME and/or AE instruction;Therapeutic activities;Patient/family education;Balance training    OT Goals(Current goals can be found in the care plan section) Acute Rehab OT Goals Patient Stated Goal: decrease pain OT Goal Formulation: With patient Time For Goal Achievement: 12/02/15 Potential to Achieve Goals: Good ADL Goals Pt Will Perform Grooming: with supervision;standing Pt Will Perform Upper Body Bathing: sitting;with set-up Pt Will Perform Lower Body Bathing: with supervision;sit to/from stand Pt Will Transfer to Toilet: with supervision;ambulating;bedside  commode Pt Will Perform Toileting - Clothing Manipulation and hygiene: with supervision;sit to/from stand  OT Frequency: Min 2X/week   Barriers to D/C: Decreased caregiver support          Co-evaluation PT/OT/SLP Co-Evaluation/Treatment: Yes Reason for Co-Treatment: Complexity of the patient's impairments (multi-system involvement);For patient/therapist safety   OT goals addressed during session: ADL's and self-care      End of Session Equipment Utilized During Treatment: Rolling walker;Gait belt  Activity Tolerance: Patient tolerated treatment well;Patient limited by pain Patient left: in chair;with call bell/phone within reach   Time: 1012-1044 OT Time Calculation (min): 32 min Charges:  OT General Charges $OT Visit: 1 Procedure OT Evaluation $OT Eval Moderate Complexity: 1 Procedure G-Codes:     Binnie Kand M.S., OTR/L Pager: [phone]  11/18/2015, 11:02 AM

## 2015-11-18 NOTE — Progress Notes (Signed)
[place] TEAM 1 - Stepdown/ICU TEAM  Dillon Thomas  [id number] DOB: 08/21/1950 DOA: 11/15/2015 PCP: [name], MD    Brief Narrative:  65yo male with uncontrolled DM, CKD 3, GERD, HTN, and TIA who presented 10/30 with 1 week hx progressive weakness followed by acute AMS, unable to speak or get up.  In ER was noted to have blood glucose >800, AKI with Scr 3.5, metabolic acidosis, HTN with SBP 200.  He was admitted by [facility] with DKA/HHNK but there was some concern for CVA and MRI was ordered.  In MRI pt had episode of emesis, unable to cooperate with study and PCCM consulted.   Significant Events: 10/29 - Admit after found down 10/31 TTE - EF 50-55% - no WMA - no DD   Subjective: The patient complains of generalized achiness and a poor appetite.  He denies chest pain shortness of breath fevers chills nausea or vomiting.  Assessment & Plan:  HHNK Resolved  Uncontrolled DM2 A1c 10.0 - CBG remains poorly controlled - adjust tx and follow   Hypothyroidism  TSH 2.954 & Free T4 1.02 - Synthroid is not listed on his admission medication list - a discharge summary from 2014 notes only a 25g dose - hold replacement therapy for now - f/u w/ his PCP after d/c   Elevated Troponin I - NSTEMI Cards has seen - plan is for cardiac cath and 2-4 weeks  Uncontrolled HTN  Adjust medical therapy and follow  Acute Encephalopathy Multifactorial - appears to be recovering to his baseline MS - no sign of focal injury on MRI Brain w/o contrast  Possible Seizure - Hx CVA At home - has returned to baseline speech & gait disturbances - EEG w/o focal abnormality  Probable Meningioma Seen on Brain MRI w/o contrast - MRI w/ contrast when pt stable is suggested by Radiology for further evaluation - will need to assure renal fxn remains stable or improves prior as it is currently on an apparent upward deflection   Acute on Chronic Renal Failure Stage II Stable - baseline creatinine  1.5-1.9  Recent Labs Lab 11/16/15 0630 11/16/15 0914 11/16/15 1800 11/17/15 0220 11/18/15 0404  CREATININE 3.25* 3.04* 3.14* 3.13* 3.20*   Hypomagnesemia Corrected  Hypokalemia  Corrected  Hyponatremia Corrected  GERD  Anemia No signs of active bleeding - chronic anemia w/ Hgb 11.2 03/04/15 - recheck in am after gentle diuresis and if remains less than 8.0 will need transfusion  DVT prophylaxis: SQ heparin  Code Status: FULL CODE Family Communication: no family present at time of exam  Disposition Plan: Stable for transfer to telemetry bed - possible CIR transfer when stable - follow hemoglobin - follow volume status - follow renal function - needs MRI brain with contrast when renal function stable  Consultants:  PCCM Cardiolgoy  Neurology   Antimicrobials:  none  Objective: Blood pressure (!) 149/72, pulse 77, temperature 97.8 F (36.6 C), temperature source Oral, resp. rate 12, height 5\' 11"  (1.803 m), weight 101.8 kg (224 lb 6.9 oz), SpO2 99 %.  Intake/Output Summary (Last 24 hours) at 11/18/15 1422 Last data filed at 11/18/15 1100  Gross per 24 hour  Intake             1120 ml  Output             1475 ml  Net             -355 ml   Filed Weights   11/15/15 1827  11/17/15 1737  Weight: 99.8 kg (220 lb) 101.8 kg (224 lb 6.9 oz)    Examination: General: No acute respiratory distress Lungs: Clear to auscultation bilaterally without wheezes or crackles Cardiovascular: Regular rate and rhythm without murmur gallop or rub normal S1 and S2 Abdomen: Nontender, nondistended, soft, bowel sounds positive, no rebound, no ascites, no appreciable mass Extremities: No significant cyanosis, clubbing, or edema bilateral lower extremities  CBC:  Recent Labs Lab 11/15/15 1830  11/15/15 2328 11/16/15 1800 11/17/15 0220 11/17/15 1653 11/18/15 0404  WBC 13.7*  --  9.9  --  11.5*  --  11.3*  NEUTROABS 11.8*  --  7.3  --  6.6  --  6.4  HGB 9.0*  < > 8.5* 7.7* 7.6*  7.9* 7.7*  HCT 31.1*  < > 26.2* 23.6* 23.4* 25.3* 24.0*  MCV 94.8  --  85.3  --  86.7  --  87.0  PLT 399  --  335  --  299  --  337  < > = values in this interval not displayed. Basic Metabolic Panel:  Recent Labs Lab 11/16/15 0630 11/16/15 0914 11/16/15 1800 11/17/15 0220 11/18/15 0404  NA 139 137 137 138 135  K 3.7 3.3* 3.8 3.5 3.8  CL 114* 113* 114* 114* 111  CO2 18* 17* 16* 17* 16*  GLUCOSE 154* 104* 169* 193* 229*  BUN 38* 37* 37* 37* 41*  CREATININE 3.25* 3.04* 3.14* 3.13* 3.20*  CALCIUM 8.1* 8.2* 8.3* 8.4* 8.5*  MG  --   --   --  1.4* 1.8  PHOS  --   --   --  4.9* 5.1*   GFR: Estimated Creatinine Clearance: 27.6 mL/min (by C-G formula based on SCr of 3.2 mg/dL (H)).  Liver Function Tests:  Recent Labs Lab 11/15/15 1830 11/15/15 2328 11/17/15 0220 11/18/15 0404  AST 23 29  --   --   ALT 14* 14*  --   --   ALKPHOS 94 82  --   --   BILITOT 0.6 0.3  --   --   PROT 7.2 6.9  --   --   ALBUMIN 1.9* 1.8* 1.5* 1.6*    Recent Labs Lab 11/15/15 1830  AMMONIA 19    Coagulation Profile:  Recent Labs Lab 11/15/15 1830  INR 1.36    Cardiac Enzymes:  Recent Labs Lab 11/15/15 2156 11/16/15 0257 11/16/15 0630 11/16/15 0914  TROPONINI 0.89* 5.34* 7.46* 7.46*    HbA1C: Hgb A1c MFr Bld  Date/Time Value Ref Range Status  11/16/2015 12:18 PM 10.0 (H) 4.8 - 5.6 % Final    Comment:    (NOTE)         Pre-diabetes: 5.7 - 6.4         Diabetes: >6.4         Glycemic control for adults with diabetes: <7.0     CBG:  Recent Labs Lab 11/17/15 1947 11/17/15 2306 11/18/15 0400 11/18/15 0742 11/18/15 1116  GLUCAP 322* 306* 232* 192* 320*    Recent Results (from the past 240 hour(s))  MRSA PCR Screening     Status: None   Collection Time: 11/16/15  4:45 AM  Result Value Ref Range Status   MRSA by PCR NEGATIVE NEGATIVE Final    Comment:        The GeneXpert MRSA Assay (FDA approved for NASAL specimens only), is one component of a comprehensive  MRSA colonization surveillance program. It is not intended to diagnose MRSA infection nor to guide  or monitor treatment for MRSA infections.   Culture, Urine     Status: None   Collection Time: 11/16/15 11:25 AM  Result Value Ref Range Status   Specimen Description URINE, CATHETERIZED  Final   Special Requests Normal  Final   Culture NO GROWTH  Final   Report Status 11/17/2015 FINAL  Final  Culture, blood (routine x 2)     Status: None (Preliminary result)   Collection Time: 11/16/15 12:15 PM  Result Value Ref Range Status   Specimen Description BLOOD RIGHT HAND  Final   Special Requests IN PEDIATRIC BOTTLE 3ML  Final   Culture NO GROWTH < 24 HOURS  Final   Report Status PENDING  Incomplete  Culture, blood (routine x 2)     Status: None (Preliminary result)   Collection Time: 11/16/15 12:20 PM  Result Value Ref Range Status   Specimen Description BLOOD LEFT HAND  Final   Special Requests IN PEDIATRIC BOTTLE 3ML  Final   Culture NO GROWTH < 24 HOURS  Final   Report Status PENDING  Incomplete     Scheduled Meds: . aspirin EC  81 mg Oral Daily  . carvedilol  6.25 mg Oral BID WC  . chlorhexidine  15 mL Mouth Rinse BID  . donepezil  10 mg Oral QHS  . folic acid  1 mg Oral Daily  . heparin subcutaneous  5,000 Units Subcutaneous Q8H  . insulin aspart  0-5 Units Subcutaneous QHS  . insulin aspart  0-9 Units Subcutaneous TID WC  . insulin aspart  3 Units Subcutaneous TID WC  . insulin glargine  10 Units Subcutaneous BID  . mouth rinse  15 mL Mouth Rinse q12n4p  . montelukast  10 mg Oral QHS  . pantoprazole  40 mg Oral BID  . rosuvastatin  20 mg Oral q1800  . thiamine  100 mg Oral Daily  . ticagrelor  90 mg Oral BID    LOS: 3 days   Cherene Altes, MD [facility] Office  [phone] Pager - Text Page per Amion as per below:  On-Call/Text Page:      [email or web address]      password [email or web address]  If 7PM-7AM, please contact night-coverage [email or web address] Password  [email or web address] 11/18/2015, 2:22 PM

## 2015-11-18 NOTE — Consult Note (Signed)
Physical Medicine and Rehabilitation Consult Reason for Consult: DKA Referring Physician: [facility]    HPI: Dillon Thomas is a 65 y.o. right handed male with history of uncontrolled type 2 diabetes mellitus with peripheral neuropathy, CKD-3 baseline creatinine 2.00, TIA. Presented 11/15/2015 with progressive shuffling gait. Per chart review patient lives alone. Reportedly independent prior to admission still driving short distances. Patient was found down by significant other and could not speak. Findings of elevated glucose greater than 700. Hemoglobin A1c of 10. MRI of the brain showed no evidence of acute infarct. MRI cervical spine with spondylosis and some canal narrowing from C4-5 through C7-T1 most pronounced at C4-5. Patient received 2 L of normal saline started on insulin infusion per protocol. Hypertensive systolic pressure [id number] as well as tachycardia 120s. Troponin 0.33. Creatinine on admission 3.30 from baseline around 2.00. Echocardiogram with ejection fraction of 55% no wall motion abnormalities. EEG showed mild diffuse slowing of the electrocerebral activity, no seizure activity. Neurology consulted with workup ongoing. Cardiology consulted for elevated troponin suspect NSTEMI with Brilinta added. Plan coronary angiography when patient medically stable and metabolic issues resolved. Physical therapy evaluation completed 11/18/2015 with recommendations of physical medicine rehabilitation consult.  Review of Systems  Constitutional: Negative for chills and fever.  HENT: Negative for hearing loss.   Eyes: Negative for double vision.  Respiratory: Negative for cough and shortness of breath.   Cardiovascular: Positive for leg swelling. Negative for chest pain.  Gastrointestinal: Positive for constipation. Negative for nausea and vomiting.       GERD  Genitourinary: Positive for urgency. Negative for dysuria and hematuria.  Musculoskeletal: Positive for joint pain and myalgias.    Skin: Negative for rash.  Neurological: Positive for weakness. Negative for headaches.  Psychiatric/Behavioral: Positive for depression.  All other systems reviewed and are negative.  Past Medical History:  Diagnosis Date  . BPH (benign prostatic hyperplasia)   . Chronic kidney disease   . Depression   . Diabetes mellitus without complication ([place])   . GERD (gastroesophageal reflux disease)   . Hyperlipidemia   . Hypertension   . Hypothyroidism   . TIA (transient ischemic attack)    Past Surgical History:  Procedure Laterality Date  . APPENDECTOMY    . CHOLECYSTECTOMY    . CYSTOURETHROSCOPY    . POLYPECTOMY     Vocal cord polyp removed  . VASECTOMY     Family History  Problem Relation Age of Onset  . CAD Mother   . Diabetes Mother   . Hyperlipidemia Mother   . Hypertension Mother   . CVA Mother   . Alcohol abuse Father   . Diabetes Father   . Hyperlipidemia Father    Social History:  reports that he quit smoking about 3 years ago. He has never used smokeless tobacco. He reports that he does not drink alcohol or use drugs. Allergies:  Allergies  Allergen Reactions  . Penicillins Other (See Comments)    Unknown  . Tetracyclines & Related Other (See Comments)    Unknown   Medications Prior to Admission  Medication Sig Dispense Refill  . allopurinol (ZYLOPRIM) 100 MG tablet Take 100 mg by mouth daily.    Marland Kitchen buPROPion (WELLBUTRIN XL) 300 MG 24 hr tablet Take 300 mg by mouth daily.    Marland Kitchen donepezil (ARICEPT) 10 MG tablet Take 10 mg by mouth at bedtime.    . hydrochlorothiazide (HYDRODIURIL) 25 MG tablet Take 25 mg by mouth daily.    Marland Kitchen  insulin aspart protamine - aspart (NOVOLOG 70/30 MIX) (70-30) 100 UNIT/ML FlexPen Inject 80 units subque with breakfast and 72 units subque with dinner.    . linagliptin (TRADJENTA) 5 MG TABS tablet Take 5 mg by mouth daily.    . metoprolol succinate (TOPROL-XL) 25 MG 24 hr tablet Take 37.5 mg by mouth daily.     . montelukast (SINGULAIR)  10 MG tablet Take 10 mg by mouth at bedtime.    . ranitidine (ZANTAC) 150 MG tablet Take 150 mg by mouth 2 (two) times daily.    . rosuvastatin (CRESTOR) 10 MG tablet Take 10 mg by mouth daily.    Marland Kitchen HYDROcodone-acetaminophen (NORCO/VICODIN) 5-325 MG tablet Take 1 tablet by mouth every 6 (six) hours as needed for moderate pain.      Home: Home Living Family/patient expects to be discharged to:: Private residence Living Arrangements: Alone Available Help at Discharge: Family, Friend(s), Available PRN/intermittently Type of Home: House Home Access: Stairs to enter Technical brewer of Steps: 3 [place]: One level Bathroom Shower/Tub: Multimedia programmer: [place]: Environmental consultant - 4 wheels, [place] - single point Additional Comments: pt reports that he is supposed to walk with his cane or rollator but that he doesn't  Functional History: Prior Function Level of Independence: Independent Comments: Drives. Reports that he speaks with girlfriend on the phone daily. Independent with ADL but does have difficulty getting off of low toilet. Functional Status:  Mobility: Bed Mobility General bed mobility comments: pt received in chair Transfers Overall transfer level: Needs assistance Equipment used: Rolling walker (2 wheeled) Transfers: Sit to/from Stand Sit to Stand: Mod assist, +2 physical assistance General transfer comment: Mod assist provided on pts R side; unable to provide physical assist on L due to pain. Ambulation/Gait Ambulation/Gait assistance: Min assist, +2 safety/equipment Ambulation Distance (Feet): 50 Feet Assistive device: 4-wheeled walker Gait Pattern/deviations: Decreased stride length, Step-to pattern General Gait Details: pt with very short steps and c/o left sided pain throughout ambulation.  Gait velocity: decreased Gait velocity interpretation: <1.8 ft/sec, indicative of risk for recurrent falls    ADL: ADL Overall ADL's : Needs  assistance/impaired Eating/Feeding: Set up, Sitting Grooming: Set up, Supervision/safety, Sitting Upper Body Bathing: Minimal assitance, Sitting Lower Body Bathing: Minimal assistance, Sit to/from stand Upper Body Dressing : Minimal assistance, Sitting Lower Body Dressing: Minimal assistance, Sit to/from stand Lower Body Dressing Details (indicate cue type and reason): Limited by pain in L side. Toilet Transfer: Minimal assistance, Ambulation, BSC, RW, +2 for safety/equipment Toilet Transfer Details (indicate cue type and reason): Simulated by sit to stand from chair with functional mobility. Functional mobility during ADLs: Minimal assistance, +2 for safety/equipment, Rolling walker General ADL Comments: Pt noted to be limiting use of LUE due to pain in L side with movement; encouraged functional use of LUE as tolerated and performing gentle AROM to shoulder throughout day.   Cognition: Cognition Overall Cognitive Status: No family/caregiver present to determine baseline cognitive functioning (questionable historian) Orientation Level: Oriented to person, Oriented to place, Oriented to time, Disoriented to situation Cognition Arousal/Alertness: Awake/alert Behavior During Therapy: Flat affect, WFL for tasks assessed/performed Overall Cognitive Status: No family/caregiver present to determine baseline cognitive functioning (questionable historian) Memory: Decreased short-term memory  Blood pressure (!) 149/72, pulse 77, temperature 97.8 F (36.6 C), temperature source Oral, resp. rate 12, height 5\' 11"  (1.803 m), weight 101.8 kg (224 lb 6.9 oz), SpO2 99 %. Physical Exam  Vitals reviewed. Constitutional: He appears well-developed.  Obese  HENT:  Head: Normocephalic and atraumatic.  Eyes: Conjunctivae and EOM are normal.  Neck: Normal range of motion. Neck supple. No thyromegaly present.  Cardiovascular: Normal rate and regular rhythm.   Respiratory: Effort normal and breath sounds  normal. No respiratory distress.  GI: Soft. Bowel sounds are normal. He exhibits no distension.  Musculoskeletal: He exhibits edema. He exhibits no tenderness.  Neurological: He is alert.  Some delay in processing. Follow simple commands Sensation intact to light touch Motor: B/l LUE: 4+/5 distal RUE: 4/5 proximal to distal RLE: hip flexion 4/5, knee extension 4+/5, ankle dorsi/plantar flexion 5/5 LLE; Hip flexion 2/5, knee extension 4/5, ankle dorsi/plantar flexion 5/5  Skin:  Skin tear that is bleeding on left forearm  Psychiatric: He has a normal mood and affect. His behavior is normal.    Results for orders placed or performed during the hospital encounter of 11/15/15 (from the past 24 hour(s))  Glucose, capillary     Status: Abnormal   Collection Time: 11/17/15  4:27 PM  Result Value Ref Range   Glucose-Capillary 281 (H) 65 - 99 mg/dL   Comment 1 Capillary Specimen    Comment 2 Notify RN   Hemoglobin and hematocrit, blood     Status: Abnormal   Collection Time: 11/17/15  4:53 PM  Result Value Ref Range   Hemoglobin 7.9 (L) 13.0 - 17.0 g/dL   HCT 25.3 (L) 39.0 - 52.0 %  Glucose, capillary     Status: Abnormal   Collection Time: 11/17/15  7:47 PM  Result Value Ref Range   Glucose-Capillary 322 (H) 65 - 99 mg/dL  Glucose, capillary     Status: Abnormal   Collection Time: 11/17/15 11:06 PM  Result Value Ref Range   Glucose-Capillary 306 (H) 65 - 99 mg/dL  Glucose, capillary     Status: Abnormal   Collection Time: 11/18/15  4:00 AM  Result Value Ref Range   Glucose-Capillary 232 (H) 65 - 99 mg/dL  Procalcitonin     Status: None   Collection Time: 11/18/15  4:04 AM  Result Value Ref Range   Procalcitonin 3.30 ng/mL  CBC with Differential/Platelet     Status: Abnormal   Collection Time: 11/18/15  4:04 AM  Result Value Ref Range   WBC 11.3 (H) 4.0 - 10.5 K/uL   RBC 2.76 (L) 4.22 - 5.81 MIL/uL   Hemoglobin 7.7 (L) 13.0 - 17.0 g/dL   HCT 24.0 (L) 39.0 - 52.0 %   MCV 87.0  78.0 - 100.0 fL   MCH 27.9 26.0 - 34.0 pg   MCHC 32.1 30.0 - 36.0 g/dL   RDW 14.9 11.5 - 15.5 %   Platelets 337 150 - 400 K/uL   Neutrophils Relative % 57 %   Neutro Abs 6.4 1.7 - 7.7 K/uL   Lymphocytes Relative 28 %   Lymphs Abs 3.2 0.7 - 4.0 K/uL   Monocytes Relative 15 %   Monocytes Absolute 1.7 (H) 0.1 - 1.0 K/uL   Eosinophils Relative 0 %   Eosinophils Absolute 0.0 0.0 - 0.7 K/uL   Basophils Relative 0 %   Basophils Absolute 0.0 0.0 - 0.1 K/uL  Magnesium     Status: None   Collection Time: 11/18/15  4:04 AM  Result Value Ref Range   Magnesium 1.8 1.7 - 2.4 mg/dL  Renal function panel     Status: Abnormal   Collection Time: 11/18/15  4:04 AM  Result Value Ref Range   Sodium 135 135 - 145 mmol/L  Potassium 3.8 3.5 - 5.1 mmol/L   Chloride 111 101 - 111 mmol/L   CO2 16 (L) 22 - 32 mmol/L   Glucose, Bld 229 (H) 65 - 99 mg/dL   BUN 41 (H) 6 - 20 mg/dL   Creatinine, Ser 3.20 (H) 0.61 - 1.24 mg/dL   Calcium 8.5 (L) 8.9 - 10.3 mg/dL   Phosphorus 5.1 (H) 2.5 - 4.6 mg/dL   Albumin 1.6 (L) 3.5 - 5.0 g/dL   GFR calc non Af Amer 19 (L) >60 mL/min   GFR calc Af Amer 22 (L) >60 mL/min   Anion gap 8 5 - 15  Glucose, capillary     Status: Abnormal   Collection Time: 11/18/15  7:42 AM  Result Value Ref Range   Glucose-Capillary 192 (H) 65 - 99 mg/dL   Comment 1 Notify RN    Comment 2 Document in Chart   Glucose, capillary     Status: Abnormal   Collection Time: 11/18/15 11:16 AM  Result Value Ref Range   Glucose-Capillary 320 (H) 65 - 99 mg/dL   Comment 1 Notify RN    Comment 2 Document in Chart    Mr Brain Wo Contrast  Result Date: 11/17/2015 CLINICAL DATA:  Progressive gait instability and speech disturbance. Obtunded. EXAM: MRI HEAD WITHOUT CONTRAST TECHNIQUE: Multiplanar, multiecho pulse sequences of the brain and surrounding structures were obtained without intravenous contrast. COMPARISON:  Head CT 11/15/2015 and 01/09/2013 FINDINGS: Brain: Diffusion imaging does not show  any acute or subacute infarction. The brainstem is normal. There are a few old small vessel cerebellar infarctions. Cerebral hemispheres show atrophy with mild chronic small-vessel ischemic changes of the deep white matter. There is an old infarction in the right caudate/ periventricular deep white matter with hemosiderin deposition. Ventricles are asymmetry with the right lateral ventricle being larger than the left, but this is felt to be largely normal variant, possibly with some contribution from the old periventricular infarction on the right. No evidence of intra-axial mass lesion, acute hemorrhage or extra-axial collection. The patient does show an extensive extra-axial mass along the planum sphenoidale with probable invasion of the cavernous sinuses and extension along the middle cranial fossae on the left medially. This is most likely represent an extensive meningioma. Postcontrast evaluation recommended when able. Vascular: Major vessels the base of the brain show flow. Skull and upper cervical spine: Chronic benign sclerotic focus in the inferior left frontal bone is unchanged and not significant. Hypercellular pattern of the clivus is correlated with the previous CT studies and is probably stable. This is probably a normal marrow pattern,, though it could possibly relate in some way to the meningioma. Sinuses/Orbits: Paranasal sinuses are clear. There are bilateral mastoid effusions. Other: None significant IMPRESSION: No evidence of acute infarction. Atrophy and chronic small-vessel ischemic changes. Ventricular asymmetry with the right lateral ventricle being more prominent is a chronic finding. Extensive extra-axial mass lesion along the floor of the anterior cranial fossa/ planum sphenoidale region most consistent with meningioma. This may involve the cavernous sinuses and does extend along the medial aspect of the middle cranial fossa on the left. Postcontrast evaluation suggested when able.  Indeterminate marrow pattern of the clivus. This can be re-evaluated after contrast administration. Electronically Signed   By: Nelson Chimes M.D.   On: 11/17/2015 07:27   Mr Cervical Spine Wo Contrast  Result Date: 11/17/2015 CLINICAL DATA:  Acute onset of altered mental status. Found unresponsive on the ground. EXAM: MRI CERVICAL SPINE WITHOUT CONTRAST TECHNIQUE:  Multiplanar, multisequence MR imaging of the cervical spine was performed. No intravenous contrast was administered. COMPARISON:  MRI brain same day FINDINGS: The study suffers from considerable motion degradation. Alignment: Straightening of the normal cervical lordosis. Vertebrae: Abnormal marrow pattern of the clivus as discussed at brain imaging. This could represent benign hypercellular marrow, but re-evaluation after contrast administration is suggested, particularly given the finding of the skullbase meningioma at brain imaging. No marrow or focal bone lesion seen in the cervical region proper. Cord: No primary cord lesion. Spinal stenosis C4-5 causes some deformity of the cord common Tower. Posterior Fossa, vertebral arteries, paraspinal tissues: Few old small vessel cerebellar infarctions. Vessels are patent. Disc levels: Foramen magnum and C1-2:  Wide patency. C2-3:  Wide patency. C3-4: Mild facet degeneration. Mild disc bulge. No significant stenosis. C4-5: Suspicion of ossification of the posterior longitudinal ligament. Spondylosis with endplate osteophytes and bulging disc material. Effacement of the subarachnoid space surrounding the cord without evidence of true cord compression. Detail is somewhat limited, but AP diameter of the canal probably measures 7.5 mm. There is bilateral foraminal stenosis. C5-6: Suspicion of ossification of the posterior longitudinal ligament. Spondylosis with endplate osteophytes and protruding disc material. Effacement of the subarachnoid space anteriorly. AP diameter of the canal 8 mm. Bilateral foraminal  stenosis could affect the C6 nerve roots. C6-7: Suspicion of 0PLL. Spondylosis. Effacement of the ventral subarachnoid space. No cord compression. Foraminal narrowing left more than right. C7-T1: Suspicion of 0PLL. Spondylosis. Narrowing of the ventral subarachnoid space but no cord compression. No foraminal stenosis. IMPRESSION: Motion degraded exam. Suspicion of ossification of the posterior longitudinal ligament from C4 through C7. This is not definite and could represent changes associated with spondylosis. In combination with spondylosis, there is canal narrowing from C4-5 through C7-T1, most pronounced at C4-5, C5-6 and C6-7. AP diameter of canal probably measures between 7 and 9 mm throughout that region. There is effacement of the subarachnoid space surrounding the cord in that region. Foraminal stenosis at C4-5, C5-6 and C6-7. Re- demonstration of indeterminate marrow pattern of the clivus. See above discussion. This should be re-evaluated at postcontrast brain imaging as previously discussed. Electronically Signed   By: Nelson Chimes M.D.   On: 11/17/2015 07:35    Assessment/Plan: Diagnosis: Debility secondary to DKA and NSTEMI Labs and images independently reviewed.  Records reviewed and summated above.  1. Does the need for close, 24 hr/day medical supervision in concert with the patient's rehab needs make it unreasonable for this patient to be served in a less intensive setting? Yes  2. Co-Morbidities requiring supervision/potential complications: NSTEMI (cont meds, further workup up when appropriate), Tachycardia (monitor in accordance with pain and increasing activity), HTN (monitor and provide prns in accordance with increased physical exertion and pain), cervical spine with spondylosis (avoid excessive hyperextension), uncontrolled type 2 diabetes mellitus with peripheral neuropathy and DKA (Monitor in accordance with exercise and adjust meds as necessary), AKI on CKD-2 (avoid nephrotixic  meds), TIA (cont meds), leukocytosis (cont to monitor for signs and symptoms of infection, further workup if indicated), ABLA (transfuse if necessary to ensure appropriate perfusion for increased activity tolerance), hypothyroidism (cont meds, ensure appropriate mood and energy level for therapies), probably hemangioma (futher workup as indicated) 3. Due to bladder management, bowel management, safety, skin/wound care, disease management, medication administration and patient education, does the patient require 24 hr/day rehab nursing? Yes 4. Does the patient require coordinated care of a physician, rehab nurse, PT (1-2 hrs/day, 5 days/week) and OT (1-2  hrs/day, 5 days/week) to address physical and functional deficits in the context of the above medical diagnosis(es)? Yes Addressing deficits in the following areas: balance, endurance, locomotion, strength, transferring, bathing, dressing, toileting and psychosocial support 5. Can the patient actively participate in an intensive therapy program of at least 3 hrs of therapy per day at least 5 days per week? Yes 6. The potential for patient to make measurable gains while on inpatient rehab is excellent 7. Anticipated functional outcomes upon discharge from inpatient rehab are modified independent  with PT, modified independent and supervision with OT, n/a with SLP. 8. Estimated rehab length of stay to reach the above functional goals is: 10-14 days. 9. Does the patient have adequate social supports and living environment to accommodate these discharge functional goals? Potentially 10. Anticipated D/C setting: Home 11. Anticipated post D/C treatments: HH therapy and Home excercise program 12. Overall Rehab/Functional Prognosis: excellent  RECOMMENDATIONS: This patient's condition is appropriate for continued rehabilitative care in the following setting: CIR  Patient has agreed to participate in recommended program. Yes Note that insurance prior  authorization may be required for reimbursement for recommended care.  Comment: Rehab Admissions Coordinator to follow up.  Delice Lesch, MD, Mellody Drown 11/18/2015

## 2015-11-18 NOTE — Progress Notes (Signed)
Inpatient Diabetes Program Recommendations  AACE/ADA: New Consensus Statement on Inpatient Glycemic Control (2015)  Target Ranges:  Prepandial:   less than 140 mg/dL      Peak postprandial:   less than 180 mg/dL (1-2 hours)      Critically ill patients:  140 - 180 mg/dL   Lab Results  Component Value Date   GLUCAP 192 (H) 11/18/2015   HGBA1C 10.0 (H) 11/16/2015    Review of Glycemic Control Results for Dillon Thomas, Dillon Thomas (MRN [id number]) as of 11/18/2015 09:46  Ref. Range 11/17/2015 07:59 11/17/2015 11:51 11/17/2015 16:27 11/17/2015 19:47 11/17/2015 23:06 11/18/2015 04:00 11/18/2015 07:42  Glucose-Capillary Latest Ref Range: 65 - 99 mg/dL 191 (H) 278 (H) 281 (H) 322 (H) 306 (H) 232 (H) 192 (H)   Inpatient Diabetes Program Recommendations:  Noted postprandial CBGs elevated. Please consider adding Novolog 3-4 units meal coverage tid if eats 50%.  Thank you, Nani Gasser. Urian Martenson, RN, MSN, CDE Inpatient Glycemic Control Team Team Pager [phone] (8am-5pm) 11/18/2015 9:49 AM

## 2015-11-18 NOTE — Progress Notes (Signed)
Rehab Admissions Coordinator Note:  Patient was screened by Retta Diones for appropriateness for an Inpatient Acute Rehab Consult.  At this time, we are recommending Inpatient Rehab consult.  Retta Diones 11/18/2015, 11:40 AM  I can be reached at [phone].

## 2015-11-18 NOTE — Evaluation (Signed)
Physical Therapy Evaluation Patient Details Name: Dillon Thomas MRN: [id number] DOB: [birth year] Today's Date: 11/18/2015   History of Present Illness  Dillon Thomas a 65 y.o.gentleman with a history of uncontrolled type 2 DM, CKD 3, peripheral neuropathy, prior TIA, GERD, and hypothyroidism who has had progressive gait instability ("shuffling gait" per his significant other), back pain, and speech difficulties who was last known to be in his baseline state of health around 11:30AM this morning. Significant other found him down and unable to speak at home at 5p that evening, incontinent of urine.   Clinical Impression  Pt admitted with above diagnosis. Pt currently with functional limitations due to the deficits listed below (see PT Problem List). Pt with significant left sided rib pain, question whether he could have sustained fractures on that side when he fell. Ambulated 57' with RW and +2 min A, very short steps with imbalance noted.  Pt will benefit from skilled PT to increase their independence and safety with mobility to allow discharge to the venue listed below.       Follow Up Recommendations CIR    Equipment Recommendations  None recommended by PT    Recommendations for Other Services       Precautions / Restrictions Precautions Precautions: Fall Restrictions Weight Bearing Restrictions: No      Mobility  Bed Mobility               General bed mobility comments: pt received in chair  Transfers Overall transfer level: Needs assistance Equipment used: Rolling walker (2 wheeled) Transfers: Sit to/from Stand Sit to Stand: Mod assist;+2 physical assistance         General transfer comment: Mod assist provided on pts R side; unable to provide physical assist on L due to pain.  Ambulation/Gait Ambulation/Gait assistance: Min assist;+2 safety/equipment Ambulation Distance (Feet): 50 Feet Assistive device: 4-wheeled walker Gait Pattern/deviations: Decreased  stride length;Step-to pattern Gait velocity: decreased Gait velocity interpretation: <1.8 ft/sec, indicative of risk for recurrent falls General Gait Details: pt with very short steps and c/o left sided pain throughout ambulation.   Stairs            Wheelchair Mobility    Modified Rankin (Stroke Patients Only)       Balance Overall balance assessment: Needs assistance;History of Falls Sitting-balance support: Feet supported;Bilateral upper extremity supported Sitting balance-Leahy Scale: Poor     Standing balance support: Bilateral upper extremity supported Standing balance-Leahy Scale: Poor Standing balance comment: reliance on RW                             Pertinent Vitals/Pain Pain Assessment: Faces Faces Pain Scale: Hurts whole lot Pain Location: L side, just below axilla Pain Descriptors / Indicators: Grimacing;Guarding;Aching Pain Intervention(s): Limited activity within patient's tolerance;Monitored during session    Home Living Family/patient expects to be discharged to:: Private residence Living Arrangements: Alone Available Help at Discharge: Family;Friend(s);Available PRN/intermittently Type of Home: House Home Access: Stairs to enter   Entrance Stairs-Number of Steps: 3 Home Layout: One level Home Equipment: Walker - 4 wheels;Cane - single point Additional Comments: pt reports that he is supposed to walk with his cane or rollator but that he doesn't    Prior Function Level of Independence: Independent         Comments: Drives. Reports that he speaks with girlfriend on the phone daily. Independent with ADL but does have difficulty getting off of low toilet.  Hand Dominance   Dominant Hand: Right    Extremity/Trunk Assessment   Upper Extremity Assessment: Defer to OT evaluation RUE Deficits / Details: Full ROM. Slightly decreased strength, grossly 4/5.     LUE Deficits / Details: Pt with full ROM but limited assessment of  strength due to L sided pain.   Lower Extremity Assessment: Generalized weakness;RLE deficits/detail;LLE deficits/detail RLE Deficits / Details: hip flex 4/5, knee ext 4/5, knee flex 4/5 LLE Deficits / Details: difficult to assess due to painful left side with LE motion. hip flex 2+/5, knee ext 3/5, knee flex 3/5  Cervical / Trunk Assessment: Normal  Communication   Communication: Expressive difficulties (has some at baseline)  Cognition Arousal/Alertness: Awake/alert Behavior During Therapy: Flat affect;WFL for tasks assessed/performed Overall Cognitive Status: No family/caregiver present to determine baseline cognitive functioning (questionable historian)       Memory: Decreased short-term memory              General Comments General comments (skin integrity, edema, etc.): VSS throughout ambulation on RA, pt states multiple times that moving hurts, encouraged repeatedly that mobility will be beneficial in the long run    Exercises General Exercises - Lower Extremity Ankle Circles/Pumps: AROM;Both;10 reps;Seated   Assessment/Plan    PT Assessment Patient needs continued PT services  PT Problem List Decreased activity tolerance;Decreased strength;Decreased balance;Decreased mobility;Decreased coordination;Decreased cognition;Decreased knowledge of use of DME;Decreased safety awareness;Decreased knowledge of precautions;Pain;Cardiopulmonary status limiting activity          PT Treatment Interventions DME instruction;Gait training;Functional mobility training;Therapeutic activities;Therapeutic exercise;Balance training;Patient/family education    PT Goals (Current goals can be found in the Care Plan section)  Acute Rehab PT Goals Patient Stated Goal: decrease pain PT Goal Formulation: With patient Time For Goal Achievement: 12/02/15 Potential to Achieve Goals: Good    Frequency Min 3X/week   Barriers to discharge Decreased caregiver support lived alone but was mod I     Co-evaluation PT/OT/SLP Co-Evaluation/Treatment: Yes Reason for Co-Treatment: Complexity of the patient's impairments (multi-system involvement);For patient/therapist safety PT goals addressed during session: Balance;Mobility/safety with mobility;Proper use of DME OT goals addressed during session: ADL's and self-care       End of Session Equipment Utilized During Treatment: Gait belt Activity Tolerance: Patient limited by pain Patient left: in chair;with call bell/phone within reach Nurse Communication: Mobility status         Time: 1012-1043 PT Time Calculation (min) (ACUTE ONLY): 31 min   Charges:   PT Evaluation $PT Eval Moderate Complexity: 1 Procedure     PT G Codes:       Leighton Roach, PT  Acute Rehab Services  [phone]  Leighton Roach 11/18/2015, 11:52 AM

## 2015-11-18 NOTE — Progress Notes (Signed)
Report given to receiving RN. Order to d/c pt's foley. Pt requested to leave it in since having lasix this evening. Educated on risks, pt insisted foley to be kept in during the night and out in the AM.

## 2015-11-19 ENCOUNTER — Inpatient Hospital Stay (HOSPITAL_COMMUNITY): Payer: MEDICARE

## 2015-11-19 DIAGNOSIS — E871 Hypo-osmolality and hyponatremia: Secondary | ICD-10-CM

## 2015-11-19 LAB — URINALYSIS, ROUTINE W REFLEX MICROSCOPIC
BILIRUBIN URINE: NEGATIVE
GLUCOSE, UA: 250 mg/dL — AB
KETONES UR: NEGATIVE mg/dL
Leukocytes, UA: NEGATIVE
NITRITE: NEGATIVE
PH: 5.5 (ref 5.0–8.0)
Protein, ur: >300 mg/dL — AB
SPECIFIC GRAVITY, URINE: 1.017 (ref 1.005–1.030)

## 2015-11-19 LAB — CBC
HCT: 24.5 % — ABNORMAL LOW (ref 39.0–52.0)
Hemoglobin: 7.8 g/dL — ABNORMAL LOW (ref 13.0–17.0)
MCH: 27.7 pg (ref 26.0–34.0)
MCHC: 31.8 g/dL (ref 30.0–36.0)
MCV: 86.9 fL (ref 78.0–100.0)
PLATELETS: 369 10*3/uL (ref 150–400)
RBC: 2.82 MIL/uL — ABNORMAL LOW (ref 4.22–5.81)
RDW: 14.8 % (ref 11.5–15.5)
WBC: 9.2 10*3/uL (ref 4.0–10.5)

## 2015-11-19 LAB — FOLATE: Folate: 21.5 ng/mL (ref >5.9)

## 2015-11-19 LAB — IRON AND TIBC
IRON: 10 ug/dL — AB (ref 45–182)
Saturation Ratios: 7 % — ABNORMAL LOW (ref 17.9–39.5)
TIBC: 146 ug/dL — ABNORMAL LOW (ref 250–450)
UIBC: 136 ug/dL

## 2015-11-19 LAB — RENAL FUNCTION PANEL
ALBUMIN: 1.5 g/dL — AB (ref 3.5–5.0)
Anion gap: 7 (ref 5–15)
BUN: 43 mg/dL — AB (ref 6–20)
CHLORIDE: 109 mmol/L (ref 101–111)
CO2: 18 mmol/L — ABNORMAL LOW (ref 22–32)
CREATININE: 3.17 mg/dL — AB (ref 0.61–1.24)
Calcium: 8.4 mg/dL — ABNORMAL LOW (ref 8.9–10.3)
GFR calc Af Amer: 22 mL/min — ABNORMAL LOW (ref >60)
GFR, EST NON AFRICAN AMERICAN: 19 mL/min — AB (ref >60)
Glucose, Bld: 90 mg/dL (ref 65–99)
Phosphorus: 4.7 mg/dL — ABNORMAL HIGH (ref 2.5–4.6)
Potassium: 3.6 mmol/L (ref 3.5–5.1)
SODIUM: 134 mmol/L — AB (ref 135–145)

## 2015-11-19 LAB — FERRITIN: FERRITIN: 189 ng/mL (ref 24–336)

## 2015-11-19 LAB — GLUCOSE, CAPILLARY
GLUCOSE-CAPILLARY: 87 mg/dL (ref 65–99)
GLUCOSE-CAPILLARY: 142 mg/dL — AB (ref 65–99)
Glucose-Capillary: 292 mg/dL — ABNORMAL HIGH (ref 65–99)
Glucose-Capillary: 266 mg/dL — ABNORMAL HIGH (ref 65–99)

## 2015-11-19 LAB — VITAMIN B12: Vitamin B-12: 378 pg/mL (ref 180–914)

## 2015-11-19 LAB — URINE MICROSCOPIC-ADD ON

## 2015-11-19 LAB — CREATININE, URINE, RANDOM: CREATININE, URINE: 68.77 mg/dL

## 2015-11-19 LAB — SODIUM, URINE, RANDOM: Sodium, Ur: 31 mmol/L

## 2015-11-19 LAB — PROTEIN, URINE, RANDOM: Total Protein, Urine: 760 mg/dL

## 2015-11-19 LAB — RETICULOCYTES
RBC.: 2.86 MIL/uL — AB (ref 4.22–5.81)
RETIC COUNT ABSOLUTE: 77.2 10*3/uL (ref 19.0–186.0)
Retic Ct Pct: 2.7 % (ref 0.4–3.1)

## 2015-11-19 LAB — OSMOLALITY: OSMOLALITY: 295 mosm/kg (ref 275–295)

## 2015-11-19 LAB — OSMOLALITY, URINE: OSMOLALITY UR: 320 mosm/kg (ref 300–900)

## 2015-11-19 MED ORDER — HYDROCODONE-ACETAMINOPHEN 5-325 MG PO TABS
1.0000 | ORAL_TABLET | Freq: Four times a day (QID) | ORAL | Status: DC | PRN
  Administered 2015-11-19 – 2015-11-23 (×9): 1 via ORAL
  Filled 2015-11-19 (×10): qty 1

## 2015-11-19 MED ORDER — SODIUM CHLORIDE 0.9 % IV SOLN
510.0000 mg | INTRAVENOUS | Status: DC
  Administered 2015-11-19: 510 mg via INTRAVENOUS
  Filled 2015-11-19: qty 17

## 2015-11-19 MED ORDER — DARBEPOETIN ALFA 100 MCG/0.5ML IJ SOSY
100.0000 ug | PREFILLED_SYRINGE | INTRAMUSCULAR | Status: DC
  Administered 2015-11-20: 100 ug via SUBCUTANEOUS
  Filled 2015-11-19: qty 0.5

## 2015-11-19 MED ORDER — BISACODYL 10 MG RE SUPP: 10.0000 mg | Freq: Every day | RECTAL | Status: DC | PRN

## 2015-11-19 MED ORDER — LEVOTHYROXINE SODIUM 25 MCG PO TABS
25.0000 ug | ORAL_TABLET | Freq: Every day | ORAL | Status: DC
  Administered 2015-11-20 – 2015-11-23 (×4): 25 ug via ORAL
  Filled 2015-11-19 (×4): qty 1

## 2015-11-19 MED ORDER — FUROSEMIDE 10 MG/ML IJ SOLN
40.0000 mg | Freq: Two times a day (BID) | INTRAMUSCULAR | Status: DC
  Administered 2015-11-19 – 2015-11-20 (×2): 40 mg via INTRAVENOUS
  Filled 2015-11-19 (×2): qty 4

## 2015-11-19 MED ORDER — CARVEDILOL 12.5 MG PO TABS
12.5000 mg | ORAL_TABLET | Freq: Two times a day (BID) | ORAL | Status: DC
  Administered 2015-11-19 – 2015-11-23 (×9): 12.5 mg via ORAL
  Filled 2015-11-19 (×9): qty 1

## 2015-11-19 NOTE — Progress Notes (Signed)
Inpatient Rehabilitation  Attempted to meet with patient; however, he if off the unit for a MBS.  Plan to follow up later.  Please call with questions.    Carmelia Roller., CCC/SLP Admission Coordinator  [place]  Cell [phone]

## 2015-11-19 NOTE — Progress Notes (Signed)
Inpatient Rehabilitation  Met with patient to discuss team's recommendation for IP Rehab.  Shared booklets and answered questions.  Patient eager to return home and regain his independence, but acknowledges that he will need therapy prior to returning home.  Additionally, he states that his significant other can provide intermittent supervision upon his return home to a single level home.  Plan to follow for timing of medical readiness and bed availability.  Please call with questions.   Carmelia Roller., CCC/SLP Admission Coordinator  [place]  Cell [phone]

## 2015-11-19 NOTE — Care Management Important Message (Signed)
Important Message  Patient Details  Name: Dillon Thomas MRN: [id number] Date of Birth: Nov 14, 1950   Medicare Important Message Given:  Yes    Culley Hedeen Abena 11/19/2015, 10:26 AM

## 2015-11-19 NOTE — Progress Notes (Addendum)
PROGRESS NOTE    Dillon Thomas  [id number] DOB: 01-21-1950 DOA: 11/15/2015 PCP: [name], MD   Chief Complaint  Patient presents with  . Altered Mental Status  . Hyperglycemia    Brief Narrative:  HPI 11/15/2015 by Dr. Lily Kocher Dillon Thomas is a 65 y.o. gentleman with a history of uncontrolled type 2 DM, CKD 3 (baseline creatinine around 2), peripheral neuropathy, prior TIA, GERD, and hypothyroidism who has had progressive gait instability ("shuffling gait" per his significant other), back pain, and speech difficulties (referred to neurology at [facility] in the summer; he has seen speech therapy as an outpatient; last MRI in the [facility] system was in 2015) who was last known to be in his baseline state of health around 11:30AM this morning.  Presently, he is obtunded and unable to engage in any type of effective communication.  This history of taken from his significant other, ED documentation, and outside medical records.  The patient's significant other found him down at home around 5PM. The patient was making sounds with his mouth but could not speak.  He was incontinent of urine.  He was staring into space.  She immediately called 911. Assessment & Plan   HHNK -Appears to have resolved  Diabetes mellitus, type II, uncontrolled -Hemoglobin A1c 10 -Continue Lantus, insulin sliding scale, CBG monitoring  Acute on chronic kidney disease, stage III -Baseline creatinine appears to be 1.5 1.9 -Call patient's PCP, creatinine on 03/04/2015 was 1.9 -Upon admission, creatinine was 3.48 -Possibly due to dehydration from HHNK versus ATN -Will order renal ultrasound, and urine electrolytes -Nephrology consulted and appreciated  Normocytic Anemia -Hemoglobin currently 7.8, appears to have been higher upon admission. -Drop may be secondary to dilutional component -Will order anemia panel and FOBT -Per family, patient has been having loose stools for the past month and has been  very dark/black in color. -Patient supposedly did have a colonoscopy a few years ago in [place]. States he has chronic anemia.  Hypothyroidism -TSH 2.954, free T4 1 0.02 -Called patient's PCP, takes synthroid 41mcg daily  -Will restart today  Elevated Troponin/NSTEMI -Cardiology consulted and appreciated -Echocardiogram: EF of 50-55%, left ventricular diastolic function parameters were normal. -Plan for cardiac cath in 2-4 weeks  Uncontrolled Hypertension -Continue Coreg and hydralazine as needed  Acute Encephalopathy -Likely multifactorial -Appears to improving -EEG showed no deformity activity -MRI brain without contrast: No evidence of acute infarction. Extensive extra-axial mass lesion along the floor of the anterior cranial fossa/planum sphenoidale region most consistent with meningioma  Possible meningioma -Seen on MRI brain without contrast. Patient will need contrast for further evaluation. However given patient's renal function, will likely be done as an outpatient.  Possible seizure/history of CVA -Specific cardiac home, patient is back to his baseline speech. He does have some gait disturbances. -EEG conducted, unremarkable -Inpatient rehabilitation consulted and appreciated  GERD -Continue PPI  Hypomagnesemia/hypokalemia/hyponatremia -Resolved, continue to monitor BMP  DVT Prophylaxis  heparin  Code Status: Full  Family Communication: None at bedside. Spoke with Ms. Sabra Heck via phone.  Disposition Plan: Admitted. Continue to work up anemia, AKI. Possible discharge to inpatient rehab in 24-48hours.  Consultants PCCM Cardiology Neurology Nephrology Inpatient rehab  Procedures  Echocardiogram EEG  Antibiotics   Anti-infectives    None      Subjective:   Dillon Thomas seen and examined today.  Patient continues to feel weak.  Denies chest pain, shortness of breath, abdominal pain, nausea or vomiting. Does endorse having loose stools  but states it  is been an ongoing issue. Denies dizziness or headache.    Objective:   Vitals:   11/18/15 2307 11/19/15 0013 11/19/15 0040 11/19/15 0508  BP: (!) 175/70 (!) 177/73 (!) 156/64 (!) 164/68  Pulse: 87 91 88 92  Resp: 18   18  Temp: 97.6 F (36.4 C)   97.8 F (36.6 C)  TempSrc: Oral   Oral  SpO2: 97% 97%  99%  Weight: 101.4 kg (223 lb 8.7 oz)     Height: 5\' 11"  (1.803 m)       Intake/Output Summary (Last 24 hours) at 11/19/15 1118 Last data filed at 11/19/15 0801  Gross per 24 hour  Intake                0 ml  Output             2050 ml  Net            -2050 ml   Filed Weights   11/15/15 1827 11/17/15 1737 11/18/15 2307  Weight: 99.8 kg (220 lb) 101.8 kg (224 lb 6.9 oz) 101.4 kg (223 lb 8.7 oz)    Exam  General: Well developed, ill appearing, NAD, appears stated age  HEENT: NCAT, mucous membranes moist.   Cardiovascular: S1 S2 auscultated, no rubs, murmurs or gallops. Regular rate and rhythm.  Respiratory: Clear to auscultation bilaterally with equal chest rise  Abdomen: Soft, obese, nontender, nondistended, + bowel sounds  Extremities: warm dry without cyanosis clubbing or edema  Neuro: AAOx3, nonfocal  Psych: Normal affect and demeanor with intact judgement and insight   Data Reviewed: I have personally reviewed following labs and imaging studies  CBC:  Recent Labs Lab 11/15/15 1830  11/15/15 2328 11/16/15 1800 11/17/15 0220 11/17/15 1653 11/18/15 0404 11/19/15 0737  WBC 13.7*  --  9.9  --  11.5*  --  11.3* 9.2  NEUTROABS 11.8*  --  7.3  --  6.6  --  6.4  --   HGB 9.0*  < > 8.5* 7.7* 7.6* 7.9* 7.7* 7.8*  HCT 31.1*  < > 26.2* 23.6* 23.4* 25.3* 24.0* 24.5*  MCV 94.8  --  85.3  --  86.7  --  87.0 86.9  PLT 399  --  335  --  299  --  337 369  < > = values in this interval not displayed. Basic Metabolic Panel:  Recent Labs Lab 11/16/15 0914 11/16/15 1800 11/17/15 0220 11/18/15 0404 11/19/15 0737  NA 137 137 138 135 134*  K 3.3* 3.8 3.5 3.8 3.6    CL 113* 114* 114* 111 109  CO2 17* 16* 17* 16* 18*  GLUCOSE 104* 169* 193* 229* 90  BUN 37* 37* 37* 41* 43*  CREATININE 3.04* 3.14* 3.13* 3.20* 3.17*  CALCIUM 8.2* 8.3* 8.4* 8.5* 8.4*  MG  --   --  1.4* 1.8  --   PHOS  --   --  4.9* 5.1* 4.7*   GFR: Estimated Creatinine Clearance: 27.8 mL/min (by C-G formula based on SCr of 3.17 mg/dL (H)). Liver Function Tests:  Recent Labs Lab 11/15/15 1830 11/15/15 2328 11/17/15 0220 11/18/15 0404 11/19/15 0737  AST 23 29  --   --   --   ALT 14* 14*  --   --   --   ALKPHOS 94 82  --   --   --   BILITOT 0.6 0.3  --   --   --   PROT 7.2 6.9  --   --   --  ALBUMIN 1.9* 1.8* 1.5* 1.6* 1.5*   No results for input(s): LIPASE, AMYLASE in the last 168 hours.  Recent Labs Lab 11/15/15 1830  AMMONIA 19   Coagulation Profile:  Recent Labs Lab 11/15/15 1830  INR 1.36   Cardiac Enzymes:  Recent Labs Lab 11/15/15 2156 11/16/15 0257 11/16/15 0630 11/16/15 0914  TROPONINI 0.89* 5.34* 7.46* 7.46*   BNP (last 3 results) No results for input(s): PROBNP in the last 8760 hours. HbA1C:  Recent Labs  11/16/15 1218  HGBA1C 10.0*   CBG:  Recent Labs Lab 11/18/15 0742 11/18/15 1116 11/18/15 1704 11/18/15 2052 11/19/15 0756  GLUCAP 192* 320* 255* 280* 87   Lipid Profile:  Recent Labs  11/16/15 1218  CHOL 129  HDL 38*  LDLCALC 73  TRIG 89  CHOLHDL 3.4   Thyroid Function Tests:  Recent Labs  11/16/15 1218  FREET4 1.02   Anemia Panel: No results for input(s): VITAMINB12, FOLATE, FERRITIN, TIBC, IRON, RETICCTPCT in the last 72 hours. Urine analysis:    Component Value Date/Time   COLORURINE YELLOW 11/15/2015 2042   APPEARANCEUR TURBID (A) 11/15/2015 2042   APPEARANCEUR Clear 01/09/2013 1858   LABSPEC 1.022 11/15/2015 2042   LABSPEC 1.015 01/09/2013 1858   PHURINE 6.0 11/15/2015 2042   GLUCOSEU >1000 (A) 11/15/2015 2042   GLUCOSEU >=500 01/09/2013 1858   HGBUR LARGE (A) 11/15/2015 2042   BILIRUBINUR  NEGATIVE 11/15/2015 2042   BILIRUBINUR Negative 01/09/2013 1858   KETONESUR 15 (A) 11/15/2015 2042   PROTEINUR >300 (A) 11/15/2015 2042   NITRITE NEGATIVE 11/15/2015 2042   LEUKOCYTESUR NEGATIVE 11/15/2015 2042   LEUKOCYTESUR Negative 01/09/2013 1858   Sepsis Labs: @LABRCNTIP (procalcitonin:4,lacticidven:4)  ) Recent Results (from the past 240 hour(s))  MRSA PCR Screening     Status: None   Collection Time: 11/16/15  4:45 AM  Result Value Ref Range Status   MRSA by PCR NEGATIVE NEGATIVE Final    Comment:        The GeneXpert MRSA Assay (FDA approved for NASAL specimens only), is one component of a comprehensive MRSA colonization surveillance program. It is not intended to diagnose MRSA infection nor to guide or monitor treatment for MRSA infections.   Culture, Urine     Status: None   Collection Time: 11/16/15 11:25 AM  Result Value Ref Range Status   Specimen Description URINE, CATHETERIZED  Final   Special Requests Normal  Final   Culture NO GROWTH  Final   Report Status 11/17/2015 FINAL  Final  Culture, blood (routine x 2)     Status: None (Preliminary result)   Collection Time: 11/16/15 12:15 PM  Result Value Ref Range Status   Specimen Description BLOOD RIGHT HAND  Final   Special Requests IN PEDIATRIC BOTTLE 3ML  Final   Culture NO GROWTH 3 DAYS  Final   Report Status PENDING  Incomplete  Culture, blood (routine x 2)     Status: None (Preliminary result)   Collection Time: 11/16/15 12:20 PM  Result Value Ref Range Status   Specimen Description BLOOD LEFT HAND  Final   Special Requests IN PEDIATRIC BOTTLE 3ML  Final   Culture NO GROWTH 3 DAYS  Final   Report Status PENDING  Incomplete      Radiology Studies: No results found.   Scheduled Meds: . aspirin EC  81 mg Oral Daily  . carvedilol  6.25 mg Oral BID WC  . donepezil  10 mg Oral QHS  . folic acid  1 mg Oral Daily  .  heparin subcutaneous  5,000 Units Subcutaneous Q8H  . insulin aspart  0-5 Units  Subcutaneous QHS  . insulin aspart  0-9 Units Subcutaneous TID WC  . insulin aspart  3 Units Subcutaneous TID WC  . insulin glargine  10 Units Subcutaneous BID  . montelukast  10 mg Oral QHS  . pantoprazole  40 mg Oral BID  . rosuvastatin  20 mg Oral q1800  . thiamine  100 mg Oral Daily  . ticagrelor  90 mg Oral BID   Continuous Infusions:    LOS: 4 days   Time Spent in minutes   30 minutes  Anija Brickner D.O. on 11/19/2015 at 11:18 AM  Between 7am to 7pm - Pager - [phone]  After 7pm go to [email or web address] - password [email or web address]  And look for the night coverage person covering for me after hours  [facility] Office  [phone]

## 2015-11-19 NOTE — H&P (Signed)
Physical Medicine and Rehabilitation Admission H&P    Chief Complaint  Patient presents with  . Altered Mental Status  . Hyperglycemia  : HPI: Dillon Thomas is a 65 y.o. right handed male with history of uncontrolled type 2 diabetes mellitus with peripheral neuropathy, CKD-3 baseline creatinine 1.90 - 2.00, TIA. Presented 11/15/2015 with progressive shuffling gait. Per chart review patient lives alone. Reportedly independent prior to admission still driving short distances. Patient was found down by significant other and could not speak. Findings of elevated glucose greater than 700. Hemoglobin A1c of 10. MRI of the brain showed no evidence of acute infarct. MRI cervical spine with spondylosis and some canal narrowing from C4-5 through C7-T1 most pronounced at C4-5. Patient received 2 L of normal saline started on insulin infusion per protocol. Hypertensive systolic pressure 269-485 as well as tachycardia 120s. Troponin 7.46. Creatinine on admission 3.30 from baseline around 2.00 and presently 3.17 with renal ultrasound showing no obstruction Nephrology consulted for follow-up in suspect prerenal etiology with hypoperfusion from hyperosmolar diuresis and perhaps ACE inhibitor. There was a cystic mass arising from the upper pole the right kidney measuring 2.5 x 2.8 x 2.2 cm. MRI suggested but could not be completed with contrast due to renal insufficiency and MR abdomen without contrast completed 11/20/2015 showing no identified renal mass. There was some noted renal cysts and recommendations for reimaging in 3-6 months Echocardiogram with ejection fraction of 55% no wall motion abnormalities. EEG showed mild diffuse slowing of the electrocerebral activity, no seizure activity. Cardiology consulted for elevated troponin suspect NSTEMI with Brilinta added. Plan coronary angiography when patient medically stable and metabolic issues resolve, in the next 2-4 weeks.MBS 11/19/2015 maintained on a regular  diet. Physical and occupational therapy evaluations completed 11/18/2015 with recommendations of physical medicine rehabilitation consult.Patient was admitted for a comprehensive rehabilitation program  ROS Constitutional: Negative for chills and fever.  HENT: Negative for hearing loss.   Eyes: Negative for double vision.  Respiratory: Negative for cough and shortness of breath.   Cardiovascular: Positive for leg swelling. Negative for chest pain.  Gastrointestinal: Positive for constipation. Negative for nausea and vomiting.       GERD  Genitourinary: Positive for urgency. Negative for dysuria and hematuria.  Musculoskeletal: Positive for joint pain.  Skin: Negative for rash.  Neurological: Positive for weakness. Negative for headaches.  Psychiatric/Behavioral: Positive for depression.  All other systems reviewed and are negative   Past Medical History:  Diagnosis Date  . BPH (benign prostatic hyperplasia)   . Chronic kidney disease   . Depression   . Diabetes mellitus without complication ([place])   . GERD (gastroesophageal reflux disease)   . Hyperlipidemia   . Hypertension   . Hypothyroidism   . TIA (transient ischemic attack)    Past Surgical History:  Procedure Laterality Date  . APPENDECTOMY    . CHOLECYSTECTOMY    . CYSTOURETHROSCOPY    . POLYPECTOMY     Vocal cord polyp removed  . VASECTOMY     Family History  Problem Relation Age of Onset  . CAD Mother   . Diabetes Mother   . Hyperlipidemia Mother   . Hypertension Mother   . CVA Mother   . Alcohol abuse Father   . Diabetes Father   . Hyperlipidemia Father    Social History:  reports that he quit smoking about 3 years ago. He has never used smokeless tobacco. He reports that he does not drink alcohol or use drugs. Allergies:  Allergies  Allergen Reactions  . Penicillins Other (See Comments)    Unknown  . Tetracyclines & Related Other (See Comments)    Unknown   Medications Prior to Admission    Medication Sig Dispense Refill  . allopurinol (ZYLOPRIM) 100 MG tablet Take 100 mg by mouth daily.    Marland Kitchen buPROPion (WELLBUTRIN XL) 300 MG 24 hr tablet Take 300 mg by mouth daily.    Marland Kitchen donepezil (ARICEPT) 10 MG tablet Take 10 mg by mouth at bedtime.    . hydrochlorothiazide (HYDRODIURIL) 25 MG tablet Take 25 mg by mouth daily.    . insulin aspart protamine - aspart (NOVOLOG 70/30 MIX) (70-30) 100 UNIT/ML FlexPen Inject 80 units subque with breakfast and 72 units subque with dinner.    . linagliptin (TRADJENTA) 5 MG TABS tablet Take 5 mg by mouth daily.    . metoprolol succinate (TOPROL-XL) 25 MG 24 hr tablet Take 37.5 mg by mouth daily.     . montelukast (SINGULAIR) 10 MG tablet Take 10 mg by mouth at bedtime.    . ranitidine (ZANTAC) 150 MG tablet Take 150 mg by mouth 2 (two) times daily.    . rosuvastatin (CRESTOR) 10 MG tablet Take 10 mg by mouth daily.    Marland Kitchen HYDROcodone-acetaminophen (NORCO/VICODIN) 5-325 MG tablet Take 1 tablet by mouth every 6 (six) hours as needed for moderate pain.      Home: Home Living Family/patient expects to be discharged to:: Private residence Living Arrangements: Alone Available Help at Discharge: Family, Friend(s), Available PRN/intermittently Type of Home: House Home Access: Stairs to enter Technical brewer of Steps: 3 [place]: One level Bathroom Shower/Tub: Multimedia programmer: [place]: Environmental consultant - 4 wheels, [place] - single point Additional Comments: pt reports that he is supposed to walk with his cane or rollator but that he doesn't   Functional History: Prior Function Level of Independence: Independent Comments: Drives. Reports that he speaks with girlfriend on the phone daily. Independent with ADL but does have difficulty getting off of low toilet.  Functional Status:  Mobility: Bed Mobility General bed mobility comments: pt received in chair Transfers Overall transfer level: Needs assistance Equipment used:  Rolling walker (2 wheeled) Transfers: Sit to/from Stand Sit to Stand: Mod assist, +2 physical assistance General transfer comment: Mod assist provided on pts R side; unable to provide physical assist on L due to pain. Ambulation/Gait Ambulation/Gait assistance: Min assist, +2 safety/equipment Ambulation Distance (Feet): 50 Feet Assistive device: 4-wheeled walker Gait Pattern/deviations: Decreased stride length, Step-to pattern General Gait Details: pt with very short steps and c/o left sided pain throughout ambulation.  Gait velocity: decreased Gait velocity interpretation: <1.8 ft/sec, indicative of risk for recurrent falls    ADL: ADL Overall ADL's : Needs assistance/impaired Eating/Feeding: Set up, Sitting Grooming: Set up, Supervision/safety, Sitting Upper Body Bathing: Minimal assitance, Sitting Lower Body Bathing: Minimal assistance, Sit to/from stand Upper Body Dressing : Minimal assistance, Sitting Lower Body Dressing: Minimal assistance, Sit to/from stand Lower Body Dressing Details (indicate cue type and reason): Limited by pain in L side. Toilet Transfer: Minimal assistance, Ambulation, BSC, RW, +2 for safety/equipment Toilet Transfer Details (indicate cue type and reason): Simulated by sit to stand from chair with functional mobility. Functional mobility during ADLs: Minimal assistance, +2 for safety/equipment, Rolling walker General ADL Comments: Pt noted to be limiting use of LUE due to pain in L side with movement; encouraged functional use of LUE as tolerated and performing gentle AROM to shoulder throughout day.  Cognition: Cognition Overall Cognitive Status: No family/caregiver present to determine baseline cognitive functioning (questionable historian) Orientation Level: Oriented to person, Oriented to place, Oriented to time, Disoriented to situation (can't remember what happened ) Cognition Arousal/Alertness: Awake/alert Behavior During Therapy: Flat affect,  WFL for tasks assessed/performed Overall Cognitive Status: No family/caregiver present to determine baseline cognitive functioning (questionable historian) Memory: Decreased short-term memory  Physical Exam: Blood pressure (!) 164/68, pulse 92, temperature 97.8 F (36.6 C), temperature source Oral, resp. rate 18, height 5' 11"  (1.803 m), weight 101.4 kg (223 lb 8.7 oz), SpO2 99 %. Physical Exam Constitutional: He appears well-developed. NAD. Obese  HENT:  Head: Normocephalic and atraumatic.  Eyes: Conjunctivae and EOM are normal.  Neck: Normal range of motion. Neck supple. No thyromegaly present.  Cardiovascular: Normal rate and regular rhythm. No JVD. Respiratory: Effort normal and breath sounds normal. No respiratory distress.  GI: Soft. Bowel sounds are normal. He exhibits no distension.  Musculoskeletal: He exhibits edema. He exhibits no tenderness.  Neurological: He is alert.  Delay in processing. Follow simple commands Sensation intact to light touch Motor: LUE: 4+/5 proximal to distal RUE: 4/5 proximal to distal RLE: hip flexion 4/5, knee extension 4+/5, ankle dorsi/plantar flexion 5/5 LLE; Hip flexion 3-/5, knee extension 4/5, ankle dorsi/plantar flexion 5/5  Skin:  Skin tear on left forearm healing. Psychiatric: He has a normal mood and affect. His behavior is normal   Results for orders placed or performed during the hospital encounter of 11/15/15 (from the past 48 hour(s))  Glucose, capillary     Status: Abnormal   Collection Time: 11/17/15  7:59 AM  Result Value Ref Range   Glucose-Capillary 191 (H) 65 - 99 mg/dL   Comment 1 Capillary Specimen    Comment 2 Notify RN   Glucose, capillary     Status: Abnormal   Collection Time: 11/17/15 11:51 AM  Result Value Ref Range   Glucose-Capillary 278 (H) 65 - 99 mg/dL   Comment 1 Capillary Specimen    Comment 2 Notify RN   Glucose, capillary     Status: Abnormal   Collection Time: 11/17/15  4:27 PM  Result Value Ref  Range   Glucose-Capillary 281 (H) 65 - 99 mg/dL   Comment 1 Capillary Specimen    Comment 2 Notify RN   Hemoglobin and hematocrit, blood     Status: Abnormal   Collection Time: 11/17/15  4:53 PM  Result Value Ref Range   Hemoglobin 7.9 (L) 13.0 - 17.0 g/dL   HCT 25.3 (L) 39.0 - 52.0 %  Glucose, capillary     Status: Abnormal   Collection Time: 11/17/15  7:47 PM  Result Value Ref Range   Glucose-Capillary 322 (H) 65 - 99 mg/dL  Glucose, capillary     Status: Abnormal   Collection Time: 11/17/15 11:06 PM  Result Value Ref Range   Glucose-Capillary 306 (H) 65 - 99 mg/dL  Glucose, capillary     Status: Abnormal   Collection Time: 11/18/15  4:00 AM  Result Value Ref Range   Glucose-Capillary 232 (H) 65 - 99 mg/dL  Procalcitonin     Status: None   Collection Time: 11/18/15  4:04 AM  Result Value Ref Range   Procalcitonin 3.30 ng/mL    Comment:        Interpretation: PCT > 2 ng/mL: Systemic infection (sepsis) is likely, unless other causes are known. (NOTE)         ICU PCT Algorithm  Non ICU PCT Algorithm    ----------------------------     ------------------------------         PCT < 0.25 ng/mL                 PCT < 0.1 ng/mL     Stopping of antibiotics            Stopping of antibiotics       strongly encouraged.               strongly encouraged.    ----------------------------     ------------------------------       PCT level decrease by               PCT < 0.25 ng/mL       >= 80% from peak PCT       OR PCT 0.25 - 0.5 ng/mL          Stopping of antibiotics                                             encouraged.     Stopping of antibiotics           encouraged.    ----------------------------     ------------------------------       PCT level decrease by              PCT >= 0.25 ng/mL       < 80% from peak PCT        AND PCT >= 0.5 ng/mL            Continuing antibiotics                                               encouraged.       Continuing antibiotics             encouraged.    ----------------------------     ------------------------------     PCT level increase compared          PCT > 0.5 ng/mL         with peak PCT AND          PCT >= 0.5 ng/mL             Escalation of antibiotics                                          strongly encouraged.      Escalation of antibiotics        strongly encouraged.   CBC with Differential/Platelet     Status: Abnormal   Collection Time: 11/18/15  4:04 AM  Result Value Ref Range   WBC 11.3 (H) 4.0 - 10.5 K/uL   RBC 2.76 (L) 4.22 - 5.81 MIL/uL   Hemoglobin 7.7 (L) 13.0 - 17.0 g/dL   HCT 24.0 (L) 39.0 - 52.0 %   MCV 87.0 78.0 - 100.0 fL   MCH 27.9 26.0 - 34.0 pg   MCHC 32.1 30.0 - 36.0 g/dL   RDW 14.9 11.5 - 15.5 %   Platelets 337 150 - 400 K/uL   Neutrophils Relative %  57 %   Neutro Abs 6.4 1.7 - 7.7 K/uL   Lymphocytes Relative 28 %   Lymphs Abs 3.2 0.7 - 4.0 K/uL   Monocytes Relative 15 %   Monocytes Absolute 1.7 (H) 0.1 - 1.0 K/uL   Eosinophils Relative 0 %   Eosinophils Absolute 0.0 0.0 - 0.7 K/uL   Basophils Relative 0 %   Basophils Absolute 0.0 0.0 - 0.1 K/uL  Magnesium     Status: None   Collection Time: 11/18/15  4:04 AM  Result Value Ref Range   Magnesium 1.8 1.7 - 2.4 mg/dL  Renal function panel     Status: Abnormal   Collection Time: 11/18/15  4:04 AM  Result Value Ref Range   Sodium 135 135 - 145 mmol/L   Potassium 3.8 3.5 - 5.1 mmol/L   Chloride 111 101 - 111 mmol/L   CO2 16 (L) 22 - 32 mmol/L   Glucose, Bld 229 (H) 65 - 99 mg/dL   BUN 41 (H) 6 - 20 mg/dL   Creatinine, Ser 3.20 (H) 0.61 - 1.24 mg/dL   Calcium 8.5 (L) 8.9 - 10.3 mg/dL   Phosphorus 5.1 (H) 2.5 - 4.6 mg/dL   Albumin 1.6 (L) 3.5 - 5.0 g/dL   GFR calc non Af Amer 19 (L) >60 mL/min   GFR calc Af Amer 22 (L) >60 mL/min    Comment: (NOTE) The eGFR has been calculated using the CKD EPI equation. This calculation has not been validated in all clinical situations. eGFR's persistently <60 mL/min signify  possible Chronic Kidney Disease.    Anion gap 8 5 - 15  Glucose, capillary     Status: Abnormal   Collection Time: 11/18/15  7:42 AM  Result Value Ref Range   Glucose-Capillary 192 (H) 65 - 99 mg/dL   Comment 1 Notify RN    Comment 2 Document in Chart   Glucose, capillary     Status: Abnormal   Collection Time: 11/18/15 11:16 AM  Result Value Ref Range   Glucose-Capillary 320 (H) 65 - 99 mg/dL   Comment 1 Notify RN    Comment 2 Document in Chart   Glucose, capillary     Status: Abnormal   Collection Time: 11/18/15  5:04 PM  Result Value Ref Range   Glucose-Capillary 255 (H) 65 - 99 mg/dL   Comment 1 Notify RN    Comment 2 Document in Chart   Glucose, capillary     Status: Abnormal   Collection Time: 11/18/15  8:52 PM  Result Value Ref Range   Glucose-Capillary 280 (H) 65 - 99 mg/dL   No results found.     Medical Problem List and Plan: 1.  Debilitation/acute encephalopathy secondary to DKA/NSTEMI--plan cardiac catheterization 2-4 weeks per Dr. Einar Gip 2.  DVT Prophylaxis/Anticoagulation: Subcutaneous heparin. Monitor platelet counts and any signs of bleeding 3. Pain Management: Hydrocodone as needed 4. Mood: Aricept 10 mg daily at bedtime 5. Neuropsych: This patient is capable of making decisions on his own behalf. 6. Skin/Wound Care: Routine skin checks 7. Fluids/Electrolytes/Nutrition: Routine I&O with follow-up chemistries 8. CKD-3 with baseline creatinine 2.00. Renal service continued to follow with workup ongoing. Demadex initiated 11/20/1998 1740 mg twice a day. Follow-up chemistries 9. Uncontrolled hypertension. Coreg 12.5 mg twice a day 10. Diabetes mellitus with peripheral neuropathy. Hemoglobin A1c of 10. Lantus insulin 10 units twice a day. Check blood sugars before meals and at bedtime. Home regimen of trandjenta and NovoLog 70/30 80 units at breakfast, 72 units with  dinner 11. Hyperlipidemia. Crestor 12. Hypothyroidism.TSH 2.954.Continue synthroid 13. Acute on  chronic anemia. Follow-up CBC. Continue Aranesp 14. Cervical spondylosis: Monitor 15. Leukocytosis: Follow CBC  Post Admission Physician Evaluation: 1. Functional deficits secondary  to debilitation. 2. Patient is admitted to receive collaborative, interdisciplinary care between the physiatrist, rehab nursing staff, and therapy team. 3. Patient's level of medical complexity and substantial therapy needs in context of that medical necessity cannot be provided at a lesser intensity of care such as a SNF. 4. Patient has experienced substantial functional loss from his/her baseline which was documented above under the "Functional History" and "Functional Status" headings.  Judging by the patient's diagnosis, physical exam, and functional history, the patient has potential for functional progress which will result in measurable gains while on inpatient rehab.  These gains will be of substantial and practical use upon discharge  in facilitating mobility and self-care at the household level. 5. Physiatrist will provide 24 hour management of medical needs as well as oversight of the therapy plan/treatment and provide guidance as appropriate regarding the interaction of the two. 6. 24 hour rehab nursing will assist with bladder management, bowel management, safety, skin/wound care, disease management, medication administration and patient education and help integrate therapy concepts, techniques,education, etc. 7. PT will assess and treat for/with: Lower extremity strength, range of motion, stamina, balance, functional mobility, safety, adaptive techniques and equipment, coping skills, pain control, education.   Goals are: Mod I. 8. OT will assess and treat for/with: ADL's, functional mobility, safety, upper extremity strength, adaptive techniques and equipment, ego support, and community reintegration.   Goals are: Mod I/Supervision. Therapy may proceed with showering this patient. 9. Case Management and Social  Worker will assess and treat for psychological issues and discharge planning. 10. Team conference will be held weekly to assess progress toward goals and to determine barriers to discharge. 11. Patient will receive at least 3 hours of therapy per day at least 5 days per week. 12. ELOS: 10-14 days.       13. Prognosis:  good  Delice Lesch, MD, Mellody Drown 11/19/2015

## 2015-11-19 NOTE — Clinical Social Work Note (Signed)
Clinical Social Work Assessment  Patient Details  Name: Dillon Thomas MRN: [id number] Date of Birth: 06/21/1950  Date of referral:  11/19/15               Reason for consult:  Facility Placement                Permission sought to share information with:  Facility Sport and exercise psychologist, Family Supports Permission granted to share information::  Yes, Verbal Permission Granted  Name::     [place] Section::  SNFs  Relationship::  Significant other  Contact Information:  [phone]  Housing/Transportation Living arrangements for the past 2 months:  [place] of Information:  Patient Patient Interpreter Needed:  None Criminal Activity/Legal Involvement Pertinent to Current Situation/Hospitalization:  No - Comment as needed Significant Relationships:  Significant Other Lives with:  Self Do you feel safe going back to the place where you live?  No Need for family participation in patient care:  No (Coment)  Care giving concerns:  CSW received consult for possible SNF placement at time of discharge if [facility] is unable to admit. CSW met with patient regarding PT recommendation of SNF placement at time of discharge. Patient reports that he is currently unable to care for himself at home given patient's current physical needs and fall risk. Patient expressed understanding of PT recommendation and is agreeable to SNF placement at time of discharge if [facility] cannot admit. CSW to continue to follow and assist with discharge planning needs.   Social Worker assessment / plan:  CSW spoke with patient concerning possibility of rehab at [facility] before returning home if CIR is unable to admit.   Employment status:  Retired Forensic scientist:  [employer] PT Recommendations:  [place], [place] / Referral to community resources:  [place]  Patient/Family's Response to care:  Patient recognizes need for rehab before returning home and  is agreeable to a SNF in [facility] if [facility] is unable to admit.  Patient/Family's Understanding of and Emotional Response to Diagnosis, Current Treatment, and Prognosis:  Patient/family is realistic regarding therapy needs and expressed being hopeful for SNF placement. Patient expressed understanding of CSW role and discharge process. No questions/concerns about plan or treatment.    Emotional Assessment Appearance:  Appears stated age Attitude/Demeanor/Rapport:  Other (Appropriate) Affect (typically observed):  Accepting, Appropriate Orientation:  Oriented to Self, Oriented to Place, Oriented to  Time, Oriented to Situation Alcohol / Substance use:  Not Applicable Psych involvement (Current and /or in the community):  No (Comment)  Discharge Needs  Concerns to be addressed:  Care Coordination Readmission within the last 30 days:  No Current discharge risk:  None Barriers to Discharge:  Continued Medical Work up   [employer], [place] 11/19/2015, 3:54 PM

## 2015-11-19 NOTE — NC FL2 (Signed)
[place] LEVEL OF CARE SCREENING TOOL     IDENTIFICATION  Patient Name: ELYA REUTHER Birthdate: 1950/07/17 Sex: male Admission Date (Current Location): 11/15/2015  [facility] and [place] Number:  Herbalist and Address:  [facility] [place] [place] [address] [place], [place] [address]      Provider Number: [id number]  Attending Physician Name and Address:  Cristal Ford, DO  Relative Name and Phone Number:  Santiago Glad significant other, [phone]    Current Level of Care: Hospital Recommended Level of Care: [place] Prior Approval Number:    Date Approved/Denied:   PASRR Number: [id number]  Discharge Plan: SNF    Current Diagnoses: Patient Active Problem List   Diagnosis Date Noted  . Acute blood loss anemia   . Benign essential HTN   . Diabetic ketoacidosis with coma associated with type 2 diabetes mellitus ([place])   . Hemangioma   . History of TIA (transient ischemic attack)   . Hyperglycemia   . Hypothyroidism   . Lymphocytosis   . NSTEMI (non-ST elevated myocardial infarction) ([place])   . Spondylosis of cervical region without myelopathy or radiculopathy   . Uncontrolled type 2 diabetes mellitus with peripheral neuropathy ([place])   . Skin tear of left elbow without complication   . Pressure injury of skin 11/16/2015  . Obtunded   . Right sided weakness   . Altered mental status 11/15/2015  . Acute metabolic encephalopathy [id number]  . AKI (acute kidney injury) ([place]) 11/15/2015  . CKD (chronic kidney disease) stage 3, GFR 30-59 ml/min 11/15/2015  . Anemia 11/15/2015  . Hyponatremia 11/15/2015  . Hyperosmolar (nonketotic) coma ([place]) 11/15/2015  . Elevated troponin 11/15/2015  . Tachycardia 11/15/2015  . Accelerated hypertension 11/15/2015    Orientation RESPIRATION BLADDER Height & Weight     Self, Time, Situation, Place  Normal Continent Weight: 101.4 kg (223 lb 8.7 oz) Height:  5\' 11"  (180.3 cm)   BEHAVIORAL SYMPTOMS/MOOD NEUROLOGICAL BOWEL NUTRITION STATUS      Continent Diet (Please see DC Summary)  AMBULATORY STATUS COMMUNICATION OF NEEDS Skin   Limited Assist Verbally PU Stage and Appropriate Care (Pressure injury stage II on sacrum)                       Personal Care Assistance Level of Assistance  Bathing, Feeding, Dressing Bathing Assistance: Limited assistance Feeding assistance: Independent Dressing Assistance: Limited assistance     Functional Limitations Info             [place]  PT (By licensed PT)     PT Frequency: 5x/week              Contractures      Additional Factors Info  Code Status, Allergies, Insulin Sliding Scale Code Status Info: Full Allergies Info: Penicillins, Tetracyclines & Related   Insulin Sliding Scale Info: insulin aspart (novoLOG) injection 0-5 Units;insulin aspart (novoLOG) injection 0-9 Units;insulin aspart (novoLOG) injection 3 Units;insulin glargine (LANTUS) injection 10 Units       Current Medications (11/19/2015):  This is the current hospital active medication list Current Facility-Administered Medications  Medication Dose Route Frequency Provider Last Rate Last Dose  . acetaminophen (TYLENOL) tablet 650 mg  650 mg Oral Q6H PRN Jose Shirl Harris, MD   650 mg at 11/18/15 [id number]  . aspirin EC tablet 81 mg  81 mg Oral Daily Neldon Labella, NP   81 mg at 11/19/15 0859  .  carvedilol (COREG) tablet 12.5 mg  12.5 mg Oral BID WC Rogue Bussing, MD      . Derrill Memo ON 11/20/2015] Darbepoetin Alfa (ARANESP) injection 100 mcg  100 mcg Subcutaneous Q Fri-1800 Rogue Bussing, MD      . donepezil (ARICEPT) tablet 10 mg  10 mg Oral QHS Javier Glazier, MD   10 mg at 11/18/15 2203  . ferumoxytol (FERAHEME) 510 mg in sodium chloride 0.9 % 100 mL IVPB  510 mg Intravenous Weekly Hillary Corinda Gubler, MD      . folic acid (FOLVITE) tablet 1 mg  1 mg Oral Daily Javier Glazier, MD   1  mg at 11/19/15 0859  . heparin injection 5,000 Units  5,000 Units Subcutaneous Q8H Marijean Heath, NP   5,000 Units at 11/19/15 0529  . hydrALAZINE (APRESOLINE) injection 10 mg  10 mg Intravenous Q4H PRN Jeryl Columbia, NP   10 mg at 11/18/15 2336  . HYDROcodone-acetaminophen (NORCO/VICODIN) 5-325 MG per tablet 1 tablet  1 tablet Oral Q6H PRN Cristal Ford, DO   1 tablet at 11/19/15 1207  . insulin aspart (novoLOG) injection 0-5 Units  0-5 Units Subcutaneous QHS Cherene Altes, MD   3 Units at 11/18/15 2203  . insulin aspart (novoLOG) injection 0-9 Units  0-9 Units Subcutaneous TID WC Cherene Altes, MD   1 Units at 11/19/15 1157  . insulin aspart (novoLOG) injection 3 Units  3 Units Subcutaneous TID WC Cherene Altes, MD   3 Units at 11/19/15 0900  . insulin glargine (LANTUS) injection 10 Units  10 Units Subcutaneous BID Cherene Altes, MD   10 Units at 11/19/15 [phone]  . labetalol (NORMODYNE,TRANDATE) injection 10 mg  10 mg Intravenous Q2H PRN Jeryl Columbia, NP      . Derrill Memo ON 11/20/2015] levothyroxine (SYNTHROID, LEVOTHROID) tablet 25 mcg  25 mcg Oral QAC breakfast Maryann Mikhail, DO      . montelukast (SINGULAIR) tablet 10 mg  10 mg Oral QHS Cherene Altes, MD   10 mg at 11/18/15 2202  . ondansetron (ZOFRAN) injection 4 mg  4 mg Intravenous Q6H PRN Lily Kocher, MD   4 mg at 11/17/15 0232  . pantoprazole (PROTONIX) EC tablet 40 mg  40 mg Oral BID Cherene Altes, MD   40 mg at 11/19/15 0859  . rosuvastatin (CRESTOR) tablet 20 mg  20 mg Oral q1800 Adrian Prows, MD   20 mg at 11/18/15 1725  . thiamine (VITAMIN B-1) tablet 100 mg  100 mg Oral Daily Javier Glazier, MD   100 mg at 11/19/15 0900  . ticagrelor (BRILINTA) tablet 90 mg  90 mg Oral BID Adrian Prows, MD   90 mg at 11/19/15 0900     Discharge Medications: Please see discharge summary for a list of discharge medications.  Relevant Imaging Results:  Relevant Lab Results:   Additional Information SSN:  [place], [place]

## 2015-11-19 NOTE — Consult Note (Signed)
Reason for Consult: Persistently elevated SCr Referring Physician: Dr. [place] Schmid is an 65 y.o. male with PMH of uncontrolled type 2 diabetes mellitus with peripheral neuropathy, CKD-3 baseline creatinine 2.00 (last noted in Feb of 2017-1.9), HTN, and TIA. Has had nephrolithiasis in the past.   HPI:  Patient presented 10/30 after being found down at home. He had progressive weakness the week prior to presentation. CBG on admission was > [id number], he had metabolic acidosis, and was hypertensive to SBP 200. He was started on an insulin drip for DKA/HHNK. MR brain and cervical spine obtained for AMS and showed evidence of cervical spondylosis and a likely meningioma on MRI brain but no acute ischemic changes. ECHO with LVEF of 55% and no wall motion abnormalities. EGG with mild diffuse slowing. Troponin elevated to 7.46 but no CK?; Cardiology recommending brilinta for suspected NSTEMI and cath when medically improved (2-4 weeks).   Per Care Everywhere, was on fosinopril 40 mg, metoprolol XL 25 mg, and HCTZ 25 mg daily for HTN. Had recently been prescribed naproxen for muscle strain 10/20/15. Patient does not recall taking naproxen--only excedrin. Says he was taking fosinopril at some point but unsure if it is a current medication. Upon presentation his creatinine was 3.48- has improved but last couple of days stalled with creatinine at 3.1-3.2.  Initial U/A showed microscopic hematuria at 6-30 and also >300 proteinuruia- renal ultrasound shows relatively large kidneys with no hydro but concerning cyst.  UOP is good- BP has been high- diffucult to tell if having uremic symptoms due to strange affect but I dont feel is having  Past Medical History:  Diagnosis Date  . BPH (benign prostatic hyperplasia)   . Chronic kidney disease   . Depression   . Diabetes mellitus without complication ([place])   . GERD (gastroesophageal reflux disease)   . Hyperlipidemia   . Hypertension   . Hypothyroidism   . TIA  (transient ischemic attack)     Past Surgical History:  Procedure Laterality Date  . APPENDECTOMY    . CHOLECYSTECTOMY    . CYSTOURETHROSCOPY    . POLYPECTOMY     Vocal cord polyp removed  . VASECTOMY      Family History  Problem Relation Age of Onset  . CAD Mother   . Diabetes Mother   . Hyperlipidemia Mother   . Hypertension Mother   . CVA Mother   . Alcohol abuse Father   . Diabetes Father   . Hyperlipidemia Father     Social History:  reports that he quit smoking about 3 years ago. He has never used smokeless tobacco. He reports that he does not drink alcohol or use drugs.  Allergies:  Allergies  Allergen Reactions  . Penicillins Other (See Comments)    Unknown  . Tetracyclines & Related Other (See Comments)    Unknown   BMP Latest Ref Rng & Units 11/19/2015 11/18/2015 11/17/2015  Glucose 65 - 99 mg/dL 90 229(H) 193(H)  BUN 6 - 20 mg/dL 43(H) 41(H) 37(H)  Creatinine 0.61 - 1.24 mg/dL 3.17(H) 3.20(H) 3.13(H)  Sodium 135 - 145 mmol/L 134(L) 135 138  Potassium 3.5 - 5.1 mmol/L 3.6 3.8 3.5  Chloride 101 - 111 mmol/L 109 111 114(H)  CO2 22 - 32 mmol/L 18(L) 16(L) 17(L)  Calcium 8.9 - 10.3 mg/dL 8.4(L) 8.5(L) 8.4(L)    Medications:  Scheduled: . aspirin EC  81 mg Oral Daily  . carvedilol  6.25 mg Oral BID WC  . donepezil  10 mg Oral QHS  . folic acid  1 mg Oral Daily  . heparin subcutaneous  5,000 Units Subcutaneous Q8H  . insulin aspart  0-5 Units Subcutaneous QHS  . insulin aspart  0-9 Units Subcutaneous TID WC  . insulin aspart  3 Units Subcutaneous TID WC  . insulin glargine  10 Units Subcutaneous BID  . [START ON 11/20/2015] levothyroxine  25 mcg Oral QAC breakfast  . montelukast  10 mg Oral QHS  . pantoprazole  40 mg Oral BID  . rosuvastatin  20 mg Oral q1800  . thiamine  100 mg Oral Daily  . ticagrelor  90 mg Oral BID   ROS: No fever, no vision changes, no SOB, no chest pain. Positive for depression, urinary urgency, leg swelling, and back  pain. Blood pressure (!) 164/68, pulse 94, temperature 97.9 F (36.6 C), temperature source Oral, resp. rate 18, height 5\' 11"  (1.803 m), weight 223 lb 8.7 oz (101.4 kg), SpO2 99 %. Body mass index is 31.18 kg/m.  Physical Exam General: Overweight male, sitting up in recliner, in NAD HENT: MMM Cardiac: RRR, S1, S2, no m/r/g Lungs: CTAB, no increased WOB, on RA, speaking in complete sentences Abdomen: +BS, mildly distended, soft, NT MSK: 1+ pitting edema to thighs.  Neuro: AOx3, moves all extremities spontaneously Psych: Normal mood and affect.   Assessment/Plan: Acute on Chronic Renal Failure: If is acute change, suspect prerenal etiology with hypoperfusion from hyperosmolar diuresis in setting of HHNK, possibly worsened by ACEi use and/or NSAID use. Last SCr 1.90 in February, so elevated SCr could also represent chronic worsening in the setting of diabetic nephropathy with poorly controlled illnesses. However, given hematuria and seemingly huge amount of proteinuria cannot rule out other conditions such as a GN. Would perform urine studies, including spot urine protein to quantify proteinuria. UA with 6-30 RBC per HPF; repeat to look for persistent hematuria.Obtain CK, as patient found down for unknown period of time and having muscle cramps. Albumin 1.5 (was 1.9 on admission), which may represent poor nutritional status vs nephrotic syndrome /intrinsic kidney injury. Right now suspect this is A on CRF in the setting of volume depletion on ACE with baseline DN.  However, given possibly nephrotic range protein and hematuria cannot rule out GN. Volume status +1.4 since admission s/p 1 dose of IV lasix and fluid overloaded on exam. Recommend IV lasix 40 mg BID until fluid balance normalized.  HTN: Still hypertensive. Will increase carvedilol to 12.5 BID. Lasix as above.  Anemia: Hgb as high as 12.5 on 02/11/15 via [place]. Agree with iron studies, which showed severe deficiency. Ordered  feraheme to be given while inpatient. Will start aranesp weekly, as well.  Elevated troponin: Concern for NSTEMI. Cardiology following. Agree with abstaining from contrast for cath until patient medically improved.   Diabetes: Per primary team. Hgb A1c 10.0, worse from hgb A1c 8.1 07/16/15. Home regimen of tradjenta and novolog 70/30 80 U with breakfast, 72 U with dinner. Improved CBGs on 10 U lantus BID, meal coverage and SSI.  [place], PGY-2 11/19/2015, 1:47 PM   Patient seen and examined, agree with above note with above modifications. 65 year old WM with poorly controlled DM and HTN with known CKD- crt of 1.9 in Feb of 2017.  He now presents with A on CRF in the setting of HHNK//vol depletion/previous ACE and possible NSAIDS.  It has improved from admission but now is stalled at 3.0-3.2.  Good  UOP. Whole picture seems consistent with this , however, the fact that he has hematuria, possible nephrotic range proteinuria raises the issue of something more substantial renal wise going on.  Will quantify protein, check CK and follow.  Otherwise contineu with supportive care- his BP is poorly controlled, have added lasix and increased coreg. Regarding his anemia, will replete iron and give dose of ESA  Corliss Parish, MD 11/19/2015

## 2015-11-19 NOTE — Progress Notes (Signed)
Speech Language Pathology Treatment: Dysphagia  Patient Details Name: Dillon Thomas MRN: [id number] DOB: 12/06/1950 Today's Date: 11/19/2015 Time: 0940-1000 SLP Time Calculation (min) (ACUTE ONLY): 20 min  Assessment / Plan / Recommendation Clinical Impression  Pt demonstrates persistent dysphagia; pt now reporting difficulty with solids, frequent coughing and throat clearing per pt. Pt noted to have intermittent wet vocal quality and coughing with solids and liquids. Pt subjectively feels that solids are causing his difficulty. Delayed coughing is not eliminated with cues for careful small sips. Will proceed with objective test to determine best diet and strategies for this pt.    HPI HPI: 65 yo male with hx uncontrolled DM, CKD 3, GERD, HTN, previous TIA, early dementia presented 10/30 with 1 week hx progressive weakness then found this am by his girlfriend/caregiver with AMS, unable to speak or get up. In ER was noted to have blood glucose >800, AKI with Scr 3.5, metabolic acidosis, HTN with SBP 200. He was admitted by [facility] with DKA/HHNK but there was some concern for CVA and MRI was ordered. In MRI pt had episode of emesis, study was aborted.       SLP Plan  MBS     Recommendations  Diet recommendations: Regular;Thin liquid Liquids provided via: Cup;No straw Medication Administration: Whole meds with puree Supervision: Patient able to self feed Compensations: Minimize environmental distractions;Slow rate;Small sips/bites                Plan: MBS       GO               Herbie Baltimore, MA CCC-SLP [phone]  Lynann Beaver 11/19/2015, 11:40 AM

## 2015-11-19 NOTE — Progress Notes (Signed)
Message was sent to doctor Ree Kida That patient has not had bowel movement for days. Awaiting response from doctor.

## 2015-11-19 NOTE — Progress Notes (Signed)
Physical Therapy Treatment Patient Details Name: Dillon Thomas MRN: [id number] DOB: 05/04/1950 Today's Date: 11/19/2015    History of Present Illness Dillon Thomas a 65 y.o.gentleman with a history of uncontrolled type 2 DM, CKD 3, peripheral neuropathy, prior TIA, GERD, and hypothyroidism who has had progressive gait instability ("shuffling gait" per his significant other), back pain, and speech difficulties who was last known to be in his baseline state of health around 11:30AM this morning. Significant other found him down and unable to speak at home at 5p that evening, incontinent of urine.     PT Comments    Pt remains to present with poor strength and endurance.  Pt will continue to benefit from rehab in post acute setting.  CIR admission coordinator speaking with patient post treatment.    Follow Up Recommendations  CIR     Equipment Recommendations  None recommended by PT    Recommendations for Other Services       Precautions / Restrictions Precautions Precautions: Fall Restrictions Weight Bearing Restrictions: No    Mobility  Bed Mobility Overal bed mobility: Needs Assistance Bed Mobility: Supine to Sit     Supine to sit: Mod assist     General bed mobility comments: Pt required assist to advance B LEs and elevate trunk into seated position edge of bed.    Transfers Overall transfer level: Needs assistance Equipment used: 4-wheeled walker Transfers: Sit to/from Stand Sit to Stand: Mod assist         General transfer comment: Pt able to lock brakes for safety.  Pt required cues for hand placement to and from seated surface.  Poor eccentric loading noted from stand to sit.    Ambulation/Gait Ambulation/Gait assistance: Min assist Ambulation Distance (Feet): 30 Feet (SHOB noted during gait SPO2 97% on RA HR 109 bpm.  ) Assistive device: 4-wheeled walker Gait Pattern/deviations: Trunk flexed;Decreased stride length;Step-to pattern Gait velocity:  decreased Gait velocity interpretation: Below normal speed for age/gender General Gait Details: Pt refused to unlock rollator and stepped to in pattern due to pain.  Pt required cues for upright posture and increasing stride length.   Stairs            Wheelchair Mobility    Modified Rankin (Stroke Patients Only)       Balance     Sitting balance-Leahy Scale: Fair       Standing balance-Leahy Scale: Poor Standing balance comment: reliance on RW able to take one hand away for perianal care.                      Cognition Arousal/Alertness: Awake/alert Behavior During Therapy: Flat affect;WFL for tasks assessed/performed Overall Cognitive Status: No family/caregiver present to determine baseline cognitive functioning (questionable historian.  )       Memory: Decreased short-term memory              Exercises      General Comments        Pertinent Vitals/Pain Pain Assessment: Faces Faces Pain Scale: Hurts whole lot Pain Location: L side just below axilla.   Pain Descriptors / Indicators: Grimacing;Guarding Pain Intervention(s): Monitored during session;Repositioned    Home Living                      Prior Function            PT Goals (current goals can now be found in the care plan section) Acute Rehab PT  Goals Patient Stated Goal: decrease pain Potential to Achieve Goals: Good Progress towards PT goals: Progressing toward goals    Frequency    Min 3X/week      PT Plan Current plan remains appropriate    Co-evaluation             End of Session Equipment Utilized During Treatment: Gait belt Activity Tolerance: Patient limited by pain Patient left: in chair;with call bell/phone within reach;with chair alarm set     Time: 1511-1536 PT Time Calculation (min) (ACUTE ONLY): 25 min  Charges:  $Gait Training: 8-22 mins $Therapeutic Activity: 8-22 mins                    G Codes:      Dillon Thomas 2015-11-26, 4:37 PM  Dillon Thomas, PTA pager [phone]

## 2015-11-19 NOTE — Progress Notes (Signed)
Modified Barium Swallow Progress Note  Patient Details  Name: MAYSEN KREIKEMEIER MRN: [id number] Date of Birth: [birth year]  Today's Date: 11/19/2015  Modified Barium Swallow completed.  Full report located under Chart Review in the Imaging Section.  Brief recommendations include the following:  Clinical Impression  Pt demonstrates a mild to moderate oropharyngeal dysphagia with mild motor and sensory deficits. The pts swallow is slightly delayed, combined with mildly decreased mobility of the hyolaryngeal complex. This results in penetration before, during and after the swallow, sometimes aspiration if the bolus is large. Sensation is absent in the laryngeal vestibule and delayed in the subglottis. Mild to moderate residuals with solids and liquids also result in penetration events post swallow. Positional changes were ineffective and, with a chin tuck, actually blocked the passage to the bolus, likely due to appearance of bony protrusions on the cervical esophagus at the level of the UES that impede bolus transit. This may also impact hyolaryngeal mobility. Best strategies to reduce aspiration risk are two swallows and intermittent throat clearing, following solids with liquids and taking small sips, no straws. Offered verbal and written instruction to pt. Will f/u for further training with a regular diet and thin liquids.    Swallow Evaluation Recommendations       SLP Diet Recommendations: Regular solids;Thin liquid   Liquid Administration via: Cup;No straw   Medication Administration: Whole meds with puree   Supervision: Patient able to self feed;Intermittent supervision to cue for compensatory strategies   Compensations: Slow rate;Small sips/bites;Follow solids with liquid;Multiple dry swallows after each bite/sip;Clear throat intermittently   Postural Changes: Seated upright at 90 degrees   Oral Care Recommendations: Oral care BID       Herbie [place], MA CCC-SLP  [id number]  Maurizio Geno, Katherene Ponto 11/19/2015,11:55 AM

## 2015-11-20 LAB — BASIC METABOLIC PANEL
ANION GAP: 7 (ref 5–15)
BUN: 50 mg/dL — AB (ref 6–20)
CO2: 20 mmol/L — AB (ref 22–32)
Calcium: 8.3 mg/dL — ABNORMAL LOW (ref 8.9–10.3)
Chloride: 108 mmol/L (ref 101–111)
Creatinine, Ser: 3.44 mg/dL — ABNORMAL HIGH (ref 0.61–1.24)
GFR calc Af Amer: 20 mL/min — ABNORMAL LOW (ref >60)
GFR, EST NON AFRICAN AMERICAN: 17 mL/min — AB (ref >60)
GLUCOSE: 87 mg/dL (ref 65–99)
POTASSIUM: 3.6 mmol/L (ref 3.5–5.1)
Sodium: 135 mmol/L (ref 135–145)

## 2015-11-20 LAB — CBC
HEMATOCRIT: 23.8 % — AB (ref 39.0–52.0)
Hemoglobin: 7.7 g/dL — ABNORMAL LOW (ref 13.0–17.0)
MCH: 28 pg (ref 26.0–34.0)
MCHC: 32.4 g/dL (ref 30.0–36.0)
MCV: 86.5 fL (ref 78.0–100.0)
PLATELETS: 352 10*3/uL (ref 150–400)
RBC: 2.75 MIL/uL — AB (ref 4.22–5.81)
RDW: 14.5 % (ref 11.5–15.5)
WBC: 8.9 10*3/uL (ref 4.0–10.5)

## 2015-11-20 LAB — CK: Total CK: 55 U/L (ref 49–397)

## 2015-11-20 LAB — GLUCOSE, CAPILLARY
GLUCOSE-CAPILLARY: 79 mg/dL (ref 65–99)
GLUCOSE-CAPILLARY: 174 mg/dL — AB (ref 65–99)
Glucose-Capillary: 155 mg/dL — ABNORMAL HIGH (ref 65–99)
Glucose-Capillary: 171 mg/dL — ABNORMAL HIGH (ref 65–99)

## 2015-11-20 MED ORDER — TORSEMIDE 20 MG PO TABS
40.0000 mg | ORAL_TABLET | Freq: Two times a day (BID) | ORAL | Status: DC
  Administered 2015-11-20 – 2015-11-21 (×2): 40 mg via ORAL
  Filled 2015-11-20 (×2): qty 2

## 2015-11-20 NOTE — Progress Notes (Signed)
Speech Language Pathology Treatment: Dysphagia  Patient Details Name: Dillon Thomas MRN: [id number] DOB: 06/10/1950 Today's Date: 11/20/2015 Time: 1000-1018 SLP Time Calculation (min) (ACUTE ONLY): 18 min  Assessment / Plan / Recommendation Clinical Impression  Pt demonstrates tolerance of regular diet, much improved with use of compensatory strategies recommended in yesterday's MBS. Pt was able to verbalize 2/4 strategies, but return demonstrated double swallows in 100% of trials after initial verbal reminder. No signs of aspiration observed. Pt awareness of mild dysphagia is much improved. Session terminated early due to MD arrival. Will f/u next week x1 for further reinforcement and education as needed.    HPI HPI: 65 yo male with hx uncontrolled DM, CKD 3, GERD, HTN, previous TIA, early dementia presented 10/30 with 1 week hx progressive weakness then found this am by his girlfriend/caregiver with AMS, unable to speak or get up. In ER was noted to have blood glucose >800, AKI with Scr 3.5, metabolic acidosis, HTN with SBP 200. He was admitted by [facility] with DKA/HHNK but there was some concern for CVA and MRI was ordered. In MRI pt had episode of emesis, study was aborted.       SLP Plan  Continue with current plan of care     Recommendations  Diet recommendations: Regular;Thin liquid Liquids provided via: Cup;No straw Medication Administration: Whole meds with puree Supervision: Patient able to self feed Compensations: Slow rate;Small sips/bites;Follow solids with liquid;Multiple dry swallows after each bite/sip;Clear throat intermittently Postural Changes and/or Swallow Maneuvers: Seated upright 90 degrees;Upright 30-60 min after meal                Plan: Continue with current plan of care       Kimberly Halen [place], MA CCC-SLP [id number]  Lynann Beaver 11/20/2015, 10:24 AM

## 2015-11-20 NOTE — Progress Notes (Signed)
Inpatient Rehabilitation  Spoke with patient regarding plan for IP Rehab once medical work up complete, hopeful for admission Monday.  He requested I call and speak with his significant other, Gertie Baron to update her as well.  I called and spoke with Santiago Glad about IP Rehab and target admit date.  She shared that they live together and she will be caregiver upon discharge.  She is in agreement with plan for IP Rehab prior to returning home because she is concerned about patient's fall risk.  Plan for my co-worker, Gerlean Ren to follow up Monday.  Carmelia Roller., CCC/SLP Admission Coordinator  [place]  Cell [phone]

## 2015-11-20 NOTE — Progress Notes (Signed)
PROGRESS NOTE    Dillon Thomas  [id number] DOB: 06-25-1950 DOA: 11/15/2015 PCP: [name], MD   Chief Complaint  Patient presents with  . Altered Mental Status  . Hyperglycemia    Brief Narrative:  HPI 11/15/2015 by Dr. Lily Kocher Dillon Thomas is a 65 y.o. gentleman with a history of uncontrolled type 2 DM, CKD 3 (baseline creatinine around 2), peripheral neuropathy, prior TIA, GERD, and hypothyroidism who has had progressive gait instability ("shuffling gait" per his significant other), back pain, and speech difficulties (referred to neurology at [facility] in the summer; he has seen speech therapy as an outpatient; last MRI in the [facility] system was in 2015) who was last known to be in his baseline state of health around 11:30AM this morning.  Presently, he is obtunded and unable to engage in any type of effective communication.  This history of taken from his significant other, ED documentation, and outside medical records.  The patient's significant other found him down at home around 5PM. The patient was making sounds with his mouth but could not speak.  He was incontinent of urine.  He was staring into space.  She immediately called 911. Assessment & Plan   HHNK -Appears to have resolved  Diabetes mellitus, type II, uncontrolled -Hemoglobin A1c 10 -Continue Lantus, insulin sliding scale, CBG monitoring  Acute on chronic kidney disease, stage III -Baseline creatinine appears to be 1.5-1.9 -Call patient's PCP, creatinine on 03/04/2015 was 1.9 -Upon admission, creatinine was 3.48 -Possibly due to dehydration from HHNK versus ATN vs GN -Renal US: no obstructing focus.   -Nephrology consulted and appreciated- pending C3,C4, ANA, ANCA, hep panel, HIV -Nephrology restarted lasix and transitioned to torsemide 40mg  BID today  Right Kidney Cystic Lesion -noted on renal US -Spoke with Dr. Posey Pronto, nephrology.  Although MRI with contrast is recommended, given patient's current  creatinine, will order MR without contrast  Normocytic Anemia -Hemoglobin currently 7.7, appears to have been higher upon admission. -Drop may be secondary to dilutional component -Anemia panel: iron 10 -feraheme ordered -Nephrology will also start weekly aranesp -Per family, patient has been having loose stools for the past month and has been very dark/black in color. -Patient supposedly did have a colonoscopy a few years ago in [place]. States he has chronic anemia. -Continue to monitor CBC  Hypothyroidism -TSH 2.954, free T4 1 0.02 -Called patient's PCP, takes synthroid 26mcg daily  -Continue synthroid  Elevated Troponin/NSTEMI -Cardiology consulted and appreciated -Echocardiogram: EF of 50-55%, left ventricular diastolic function parameters were normal. -Plan for cardiac cath in 2-4 weeks  Uncontrolled Hypertension -Continue Coreg and hydralazine as needed  Acute Encephalopathy -Likely multifactorial -Appears to improving -EEG showed no deformity activity -MRI brain without contrast: No evidence of acute infarction. Extensive extra-axial mass lesion along the floor of the anterior cranial fossa/planum sphenoidale region most consistent with meningioma  Possible meningioma -Seen on MRI brain without contrast. Patient will need contrast for further evaluation. However given patient's renal function, will likely be done as an outpatient.  Possible seizure/history of CVA -Specific cardiac home, patient is back to his baseline speech. He does have some gait disturbances. -EEG conducted, unremarkable -Inpatient rehabilitation consulted and appreciated  GERD -Continue PPI  Hypomagnesemia/hypokalemia/hyponatremia -Resolved, continue to monitor BMP  DVT Prophylaxis  heparin  Code Status: Full  Family Communication: None at bedside. Spoke with Ms. Sabra Heck via phone.  Disposition Plan: Admitted. Continue to work up anemia, AKI. Possible discharge to inpatient rehab in  24-48hours.  Consultants PCCM Cardiology Neurology Nephrology Inpatient rehab  Procedures  Echocardiogram EEG  Antibiotics   Anti-infectives    None      Subjective:   Ladarryl Mccuskey seen and examined today.  Patient continues to feel weak but feels he wants to get out of bed.  Denies chest pain, shortness of breath, abdominal pain, nausea or vomiting. States he has had alternating loose stools with constipation. Denies dark stools or blood per rectum.     Objective:   Vitals:   11/19/15 1337 11/19/15 1623 11/19/15 2058 11/20/15 0609  BP: (!) 164/68 (!) 191/79 (!) 168/66 (!) 153/84  Pulse: 94 93 82 72  Resp: 18  18 18   Temp: 97.9 F (36.6 C)  97.7 F (36.5 C) 98.5 F (36.9 C)  TempSrc: Oral  Oral Oral  SpO2: 99%  99% 99%  Weight:      Height:        Intake/Output Summary (Last 24 hours) at 11/20/15 1254 Last data filed at 11/20/15 0933  Gross per 24 hour  Intake              597 ml  Output             1075 ml  Net             -478 ml   Filed Weights   11/15/15 1827 11/17/15 1737 11/18/15 2307  Weight: 99.8 kg (220 lb) 101.8 kg (224 lb 6.9 oz) 101.4 kg (223 lb 8.7 oz)    Exam  General: Well developed, ill appearing, NAD, appears stated age  HEENT: NCAT, mucous membranes moist.   Cardiovascular: S1 S2 auscultated, RRR, no murmurs  Respiratory: Clear to auscultation bilaterally with equal chest rise  Abdomen: Soft, obese, mildly distended, mild TTP lower abdomen, + bowel sounds  Extremities: warm dry without cyanosis clubbing.   Neuro: AAOx3, nonfocal  Psych: Normal affect and demeanor    Data Reviewed: I have personally reviewed following labs and imaging studies  CBC:  Recent Labs Lab 11/15/15 1830  11/15/15 2328  11/17/15 0220 11/17/15 1653 11/18/15 0404 11/19/15 0737 11/20/15 0326  WBC 13.7*  --  9.9  --  11.5*  --  11.3* 9.2 8.9  NEUTROABS 11.8*  --  7.3  --  6.6  --  6.4  --   --   HGB 9.0*  < > 8.5*  < > 7.6* 7.9* 7.7* 7.8* 7.7*    HCT 31.1*  < > 26.2*  < > 23.4* 25.3* 24.0* 24.5* 23.8*  MCV 94.8  --  85.3  --  86.7  --  87.0 86.9 86.5  PLT 399  --  335  --  299  --  337 369 352  < > = values in this interval not displayed. Basic Metabolic Panel:  Recent Labs Lab 11/16/15 1800 11/17/15 0220 11/18/15 0404 11/19/15 0737 11/20/15 0326  NA 137 138 135 134* 135  K 3.8 3.5 3.8 3.6 3.6  CL 114* 114* 111 109 108  CO2 16* 17* 16* 18* 20*  GLUCOSE 169* 193* 229* 90 87  BUN 37* 37* 41* 43* 50*  CREATININE 3.14* 3.13* 3.20* 3.17* 3.44*  CALCIUM 8.3* 8.4* 8.5* 8.4* 8.3*  MG  --  1.4* 1.8  --   --   PHOS  --  4.9* 5.1* 4.7*  --    GFR: Estimated Creatinine Clearance: 25.6 mL/min (by C-G formula based on SCr of 3.44 mg/dL (H)). Liver Function Tests:  Recent Labs Lab  11/15/15 1830 11/15/15 2328 11/17/15 0220 11/18/15 0404 11/19/15 0737  AST 23 29  --   --   --   ALT 14* 14*  --   --   --   ALKPHOS 94 82  --   --   --   BILITOT 0.6 0.3  --   --   --   PROT 7.2 6.9  --   --   --   ALBUMIN 1.9* 1.8* 1.5* 1.6* 1.5*   No results for input(s): LIPASE, AMYLASE in the last 168 hours.  Recent Labs Lab 11/15/15 1830  AMMONIA 19   Coagulation Profile:  Recent Labs Lab 11/15/15 1830  INR 1.36   Cardiac Enzymes:  Recent Labs Lab 11/15/15 2156 11/16/15 0257 11/16/15 0630 11/16/15 0914 11/20/15 0326  CKTOTAL  --   --   --   --  55  TROPONINI 0.89* 5.34* 7.46* 7.46*  --    BNP (last 3 results) No results for input(s): PROBNP in the last 8760 hours. HbA1C: No results for input(s): HGBA1C in the last 72 hours. CBG:  Recent Labs Lab 11/19/15 1154 11/19/15 1714 11/19/15 2054 11/20/15 0750 11/20/15 1209  GLUCAP 142* 292* 266* 79 174*   Lipid Profile: No results for input(s): CHOL, HDL, LDLCALC, TRIG, CHOLHDL, LDLDIRECT in the last 72 hours. Thyroid Function Tests: No results for input(s): TSH, T4TOTAL, FREET4, T3FREE, THYROIDAB in the last 72 hours. Anemia Panel:  Recent Labs   11/19/15 1132  VITAMINB12 378  FOLATE 21.5  FERRITIN 189  TIBC 146*  IRON 10*  RETICCTPCT 2.7   Urine analysis:    Component Value Date/Time   COLORURINE ORANGE (A) 11/19/2015 1340   APPEARANCEUR CLOUDY (A) 11/19/2015 1340   APPEARANCEUR Clear 01/09/2013 1858   LABSPEC 1.017 11/19/2015 1340   LABSPEC 1.015 01/09/2013 1858   PHURINE 5.5 11/19/2015 1340   GLUCOSEU 250 (A) 11/19/2015 1340   GLUCOSEU >=500 01/09/2013 1858   HGBUR LARGE (A) 11/19/2015 1340   BILIRUBINUR NEGATIVE 11/19/2015 1340   BILIRUBINUR Negative 01/09/2013 1858   KETONESUR NEGATIVE 11/19/2015 1340   PROTEINUR >300 (A) 11/19/2015 1340   NITRITE NEGATIVE 11/19/2015 1340   LEUKOCYTESUR NEGATIVE 11/19/2015 1340   LEUKOCYTESUR Negative 01/09/2013 1858   Sepsis Labs: @LABRCNTIP (procalcitonin:4,lacticidven:4)  ) Recent Results (from the past 240 hour(s))  MRSA PCR Screening     Status: None   Collection Time: 11/16/15  4:45 AM  Result Value Ref Range Status   MRSA by PCR NEGATIVE NEGATIVE Final    Comment:        The GeneXpert MRSA Assay (FDA approved for NASAL specimens only), is one component of a comprehensive MRSA colonization surveillance program. It is not intended to diagnose MRSA infection nor to guide or monitor treatment for MRSA infections.   Culture, Urine     Status: None   Collection Time: 11/16/15 11:25 AM  Result Value Ref Range Status   Specimen Description URINE, CATHETERIZED  Final   Special Requests Normal  Final   Culture NO GROWTH  Final   Report Status 11/17/2015 FINAL  Final  Culture, blood (routine x 2)     Status: None (Preliminary result)   Collection Time: 11/16/15 12:15 PM  Result Value Ref Range Status   Specimen Description BLOOD RIGHT HAND  Final   Special Requests IN PEDIATRIC BOTTLE 3ML  Final   Culture NO GROWTH 3 DAYS  Final   Report Status PENDING  Incomplete  Culture, blood (routine x 2)  Status: None (Preliminary result)   Collection Time: 11/16/15  12:20 PM  Result Value Ref Range Status   Specimen Description BLOOD LEFT HAND  Final   Special Requests IN PEDIATRIC BOTTLE 3ML  Final   Culture NO GROWTH 3 DAYS  Final   Report Status PENDING  Incomplete      Radiology Studies: US Renal  Result Date: 11/19/2015 CLINICAL DATA:  Acute renal insufficiency EXAM: RENAL / URINARY TRACT ULTRASOUND COMPLETE COMPARISON:  None. FINDINGS: Right Kidney: Length: 12.2 cm. Echogenicity is increased. Renal cortical thickness is normal. No perinephric fluid or hydronephrosis visualized. There is a cystic mass arising from the upper pole of the right kidney measuring 2.5 x 2.8 x 2.2 cm. Within this cystic mass, there is a solid-appearing focus measuring 1.4 x 1.2 x 1.2 cm. There is no sonographically demonstrable calculus or ureterectasis. Left Kidney: Length: 11.6 cm. Echogenicity is increased. Renal cortical thickness is normal. No perinephric fluid or hydronephrosis visualized. There is a cyst arising from the left kidney measuring 3.0 x 2.6 x 2.8 cm. No sonographically demonstrable calculus or ureterectasis. Bladder: Appears normal for degree of bladder distention. IMPRESSION: Kidneys are echogenic consistent with medical renal disease. No obstructing focus identified on either side. There is a cystic lesion arising from the upper pole of the right kidney which contains a solid focus. This appearance raises concern for potential renal neoplasm with a cystic component. Further evaluation with pre and post contrast MRI or CT should be considered. MRI is preferred in younger patients (due to lack of ionizing radiation) and for evaluating calcified lesion(s). Given the elevated creatinine, MRI may be a better choice for further assessment if there is no contraindication to MR imaging. There is a simple cyst in the left kidney. These results will be called to the ordering clinician or representative by the Radiologist Assistant, and communication documented in the PACS  or zVision Dashboard. Electronically Signed   By: Lowella Grip III M.D.   On: 11/19/2015 14:57   Dg Swallowing Func-speech Pathology  Result Date: 11/19/2015 Objective Swallowing Evaluation: Type of Study: MBS-Modified Barium Swallow Study Patient Details Name: BEREKET FORSBERG MRN: [id number] Date of Birth: [birth year] Today's Date: 11/19/2015 Time: SLP Start Time (ACUTE ONLY): 1110-SLP Stop Time (ACUTE ONLY): 1132 SLP Time Calculation (min) (ACUTE ONLY): 22 min Past Medical History: Past Medical History: Diagnosis Date . BPH (benign prostatic hyperplasia)  . Chronic kidney disease  . Depression  . Diabetes mellitus without complication ([place])  . GERD (gastroesophageal reflux disease)  . Hyperlipidemia  . Hypertension  . Hypothyroidism  . TIA (transient ischemic attack)  Past Surgical History: Past Surgical History: Procedure Laterality Date . APPENDECTOMY   . CHOLECYSTECTOMY   . CYSTOURETHROSCOPY   . POLYPECTOMY    Vocal cord polyp removed . VASECTOMY   HPI: 65 yo male with hx uncontrolled DM, CKD 3, GERD, HTN, previous TIA, early dementia presented 10/30 with 1 week hx progressive weakness then found this am by his girlfriend/caregiver with AMS, unable to speak or get up. In ER was noted to have blood glucose >800, AKI with Scr 3.5, metabolic acidosis, HTN with SBP 200. He was admitted by [facility] with DKA/HHNK but there was some concern for CVA and MRI was ordered. In MRI pt had episode of emesis, study was aborted.  Subjective: alert Assessment / Plan / Recommendation CHL IP CLINICAL IMPRESSIONS 11/19/2015 Therapy Diagnosis Moderate pharyngeal phase dysphagia;Mild cervical esophageal phase dysphagia Clinical Impression Pt demonstrates a mild to moderate oropharyngeal  dysphagia with mild motor and sensory deficits. The pts swallow is slightly delayed, combined with mildly decreased mobility of the hyolaryngeal complex. This results in penetration before, during and after the swallow, sometimes aspiration if the  bolus is large. Sensation is absent in the laryngeal vestibule and delayed in the subglottis. Mild to moderate residuals with solids and liquids also result in penetration events post swallow. Positional changes were ineffective and, with a chin tuck, actually blocked the passage to the bolus, likely due to appearance of bony protrusions on the cervical esophagus at the level of the UES that impede bolus transit. This may also impact hyolaryngeal mobility. Best strategies to reduce aspiration risk are two swallows and intermittent throat clearing, following solids with liquids and taking small sips, no straws. Offered verbal and written instruction to pt. Will f/u for further training with a regular diet and thin liquids.  Impact on safety and function Moderate aspiration risk   CHL IP TREATMENT RECOMMENDATION 11/19/2015 Treatment Recommendations Therapy as outlined in treatment plan below   Prognosis 11/19/2015 Prognosis for Safe Diet Advancement Good Barriers to Reach Goals -- Barriers/Prognosis Comment -- CHL IP DIET RECOMMENDATION 11/19/2015 SLP Diet Recommendations Regular solids;Thin liquid Liquid Administration via Cup;No straw Medication Administration Whole meds with puree Compensations Slow rate;Small sips/bites;Follow solids with liquid;Multiple dry swallows after each bite/sip;Clear throat intermittently Postural Changes Seated upright at 90 degrees   CHL IP OTHER RECOMMENDATIONS 11/19/2015 Recommended Consults -- Oral Care Recommendations Oral care BID Other Recommendations --   CHL IP FOLLOW UP RECOMMENDATIONS 11/19/2015 Follow up Recommendations Inpatient Rehab   CHL IP FREQUENCY AND DURATION 11/19/2015 Speech Therapy Frequency (ACUTE ONLY) min 2x/week Treatment Duration 2 weeks      CHL IP ORAL PHASE 11/19/2015 Oral Phase WFL Oral - Pudding Teaspoon -- Oral - Pudding Cup -- Oral - Honey Teaspoon -- Oral - Honey Cup -- Oral - Nectar Teaspoon -- Oral - Nectar Cup -- Oral - Nectar Straw -- Oral - Thin Teaspoon  -- Oral - Thin Cup -- Oral - Thin Straw -- Oral - Puree -- Oral - Mech Soft -- Oral - Regular -- Oral - Multi-Consistency -- Oral - Pill -- Oral Phase - Comment --  CHL IP PHARYNGEAL PHASE 11/19/2015 Pharyngeal Phase Impaired Pharyngeal- Pudding Teaspoon -- Pharyngeal -- Pharyngeal- Pudding Cup -- Pharyngeal -- Pharyngeal- Honey Teaspoon -- Pharyngeal -- Pharyngeal- Honey Cup -- Pharyngeal -- Pharyngeal- Nectar Teaspoon -- Pharyngeal -- Pharyngeal- Nectar Cup -- Pharyngeal -- Pharyngeal- Nectar Straw -- Pharyngeal -- Pharyngeal- Thin Teaspoon -- Pharyngeal -- Pharyngeal- Thin Cup Delayed swallow initiation-pyriform sinuses;Reduced laryngeal elevation;Reduced anterior laryngeal mobility;Pharyngeal residue - valleculae;Pharyngeal residue - pyriform;Penetration/Aspiration before swallow;Penetration/Aspiration during swallow;Penetration/Apiration after swallow;Trace aspiration;Compensatory strategies attempted (with notebox) Pharyngeal Material does not enter airway;Material enters airway, remains ABOVE vocal cords and not ejected out;Material enters airway, CONTACTS cords and not ejected out;Material enters airway, passes BELOW cords then ejected out;Material enters airway, passes BELOW cords without attempt by patient to eject out (silent aspiration) Pharyngeal- Thin Straw Delayed swallow initiation-pyriform sinuses;Reduced laryngeal elevation;Reduced anterior laryngeal mobility;Pharyngeal residue - valleculae;Pharyngeal residue - pyriform;Penetration/Aspiration before swallow;Penetration/Aspiration during swallow;Penetration/Apiration after swallow;Trace aspiration;Compensatory strategies attempted (with notebox) Pharyngeal Material enters airway, passes BELOW cords without attempt by patient to eject out (silent aspiration);Material enters airway, passes BELOW cords then ejected out Pharyngeal- Puree Reduced laryngeal elevation;Reduced anterior laryngeal mobility;Pharyngeal residue - valleculae;Pharyngeal residue -  pyriform Pharyngeal Material does not enter airway Pharyngeal- Mechanical Soft -- Pharyngeal -- Pharyngeal- Regular Reduced laryngeal elevation;Reduced anterior laryngeal mobility;Pharyngeal residue -  valleculae;Pharyngeal residue - pyriform Pharyngeal -- Pharyngeal- Multi-consistency -- Pharyngeal -- Pharyngeal- Pill -- Pharyngeal -- Pharyngeal Comment Delayed swallow initiation-pyriform sinuses;Reduced laryngeal elevation;Reduced anterior laryngeal mobility;Pharyngeal residue - valleculae;Pharyngeal residue - pyriform;Penetration/Aspiration before swallow;Penetration/Aspiration during swallow;Penetration/Apiration after swallow;Trace aspiration;Compensatory strategies attempted (with notebox)  CHL IP CERVICAL ESOPHAGEAL PHASE 11/19/2015 Cervical Esophageal Phase Impaired Pudding Teaspoon -- Pudding Cup -- Honey Teaspoon -- Honey Cup -- Nectar Teaspoon -- Nectar Cup -- Nectar Straw -- Thin Teaspoon -- Thin Cup -- Thin Straw -- Puree -- Mechanical Soft -- Regular -- Multi-consistency -- Pill -- Cervical Esophageal Comment With a chin tuck, thin liquids would not pass the cervical esophagus, likely due to bony protrusion on anterior cervical spine CHL IP GO 08/21/2015 Functional Assessment Tool Used Western Aphasia Battery- REvised, clinical judgment Functional Limitations Spoken language expressive Swallow Current Status [phone]) (None) Swallow Goal Status [id number]) (None) Swallow Discharge Status [id number]) (None) Motor Speech Current Status [phone]) (None) Motor Speech Goal Status [phone]) (None) Motor Speech Goal Status [id number]) (None) Spoken Language Comprehension Current Status [id number]) (None) Spoken Language Comprehension Goal Status [id number]) (None) Spoken Language Comprehension Discharge Status [phone]) (None) Spoken Language Expression Current Status [id number]) CJ Spoken Language Expression Goal Status [id number]) CI Spoken Language Expression Discharge Status [phone]) (None) Attention Current Status [id number]) (None) Attention Goal  Status [id number]) (None) Attention Discharge Status [id number]) (None) Memory Current Status [id number]) (None) Memory Goal Status [id number]) (None) Memory Discharge Status [id number]) (None) Voice Current Status [id number]) (None) Voice Goal Status [id number]) (None) Voice Discharge Status [id number]) (None) Other Speech-Language Pathology Functional Limitation [phone]) (None) Other Speech-Language Pathology Functional Limitation Goal Status [id number]) (None) Other Speech-Language Pathology Functional Limitation Discharge Status [phone]) (None) Herbie Baltimore, MA CCC-SLP [phone] [place], Katherene Ponto 11/19/2015, 11:57 AM                Scheduled Meds: . aspirin EC  81 mg Oral Daily  . carvedilol  12.5 mg Oral BID WC  . darbepoetin (ARANESP) injection - NON-DIALYSIS  100 mcg Subcutaneous Q Fri-1800  . donepezil  10 mg Oral QHS  . ferumoxytol  510 mg Intravenous Weekly  . folic acid  1 mg Oral Daily  . heparin subcutaneous  5,000 Units Subcutaneous Q8H  . insulin aspart  0-5 Units Subcutaneous QHS  . insulin aspart  0-9 Units Subcutaneous TID WC  . insulin aspart  3 Units Subcutaneous TID WC  . insulin glargine  10 Units Subcutaneous BID  . levothyroxine  25 mcg Oral QAC breakfast  . montelukast  10 mg Oral QHS  . pantoprazole  40 mg Oral BID  . rosuvastatin  20 mg Oral q1800  . thiamine  100 mg Oral Daily  . ticagrelor  90 mg Oral BID  . torsemide  40 mg Oral BID   Continuous Infusions:    LOS: 5 days   Time Spent in minutes   30 minutes  Krosby Ritchie D.O. on 11/20/2015 at 12:54 PM  Between 7am to 7pm - Pager - [phone]  After 7pm go to [email or web address] - password [email or web address]  And look for the night coverage person covering for me after hours  [facility] Office  [phone]

## 2015-11-20 NOTE — Progress Notes (Signed)
Occupational Therapy Treatment Patient Details Name: Dillon Thomas MRN: [id number] DOB: 1950-06-11 Today's Date: 11/20/2015    History of present illness Dillon Ratkovich Mooreis a 65 y.o.gentleman with a history of uncontrolled type 2 DM, CKD 3, peripheral neuropathy, prior TIA, GERD, and hypothyroidism who has had progressive gait instability ("shuffling gait" per his significant other), back pain, and speech difficulties who was last known to be in his baseline state of health around 11:30AM this morning. Significant other found him down and unable to speak at home at 5p that evening, incontinent of urine.    OT comments  Pt still with increased left side and leg pain limiting mobility and independence with bathing/dressing and toileting tasks.  Mod assist needed for sit to stand during transfer from bed and during selfcare tasks.  Max assist to transition to the EOB from supine.  Feel he still needs 24 hour supervision for safety with recommendation for inpatient CIR level therapies.    Follow Up Recommendations  CIR;Supervision/Assistance - 24 hour    Equipment Recommendations  3 in 1 bedside comode    Recommendations for Other Services Rehab consult    Precautions / Restrictions Precautions Precautions: Fall Restrictions Weight Bearing Restrictions: No       Mobility Bed Mobility Overal bed mobility: Needs Assistance Bed Mobility: Supine to Sit     Supine to sit: Max assist     General bed mobility comments: Min assist for moving the LLE to the EOB with max assist for lifting trunk into sitting position.   Transfers Overall transfer level: Needs assistance Equipment used: Rolling walker (2 wheeled) Transfers: Sit to/from Stand Sit to Stand: Mod assist         General transfer comment: Mod assist for sit to stand from the bed and from the [facility].     Balance Overall balance assessment: Needs assistance Sitting-balance support: Feet supported Sitting balance-Leahy  Scale: Fair       Standing balance-Leahy Scale: Poor Standing balance comment: Pt needs use of the RW for support in standing.                    ADL Overall ADL's : Needs assistance/impaired     Grooming: Wash/dry hands;Wash/dry face;Set up   Upper Body Bathing: Set up;Sitting   Lower Body Bathing: Moderate assistance;Sit to/from stand   Upper Body Dressing : Supervision/safety   Lower Body Dressing: Supervision/safety   Toilet Transfer: Maximal assistance;RW;Stand-pivot           Functional mobility during ADLs: Minimal assistance;Rolling walker General ADL Comments: Pt needing mod assist for sit to stand from the bed secondary to weakness and pain.  Still reporting increased pain in the left side and LLE with transitional movements.  Max assist for supine to sit EOB as well.  Pt needs increased time to prepare for all functional movements.                  Cognition   Behavior During Therapy: Anxious (Slightly anxious secondary to increased pain)                                      Pertinent Vitals/ Pain       Pain Assessment: Faces Faces Pain Scale: Hurts little more Pain Location: left side and left hip Pain Descriptors / Indicators: Discomfort;Crushing Pain Intervention(s): Monitored during session  Frequency  Min 2X/week        Progress Toward Goals  OT Goals(current goals can now be found in the care plan section)  Progress towards OT goals: Progressing toward goals     Plan Discharge plan remains appropriate       End of Session Equipment Utilized During Treatment: Rolling walker;Gait belt   Activity Tolerance Patient limited by pain   Patient Left in chair;with call bell/phone within reach;with chair alarm set   Nurse Communication Other (comment) (Skin tear issues)        Time: [id number] OT Time Calculation (min): 56 min  Charges: OT General Charges $OT Visit: 1 Procedure OT Treatments $Self  Care/Home Management : 53-67 mins  Dillon Thomas OTR/L 11/20/2015, 3:38 PM

## 2015-11-20 NOTE — PMR Pre-admission (Signed)
PMR Admission Coordinator Pre-Admission Assessment  Patient: Dillon Thomas is an 65 y.o., male MRN: [id number] DOB: 19-Oct-1950 Height: 5\' 11"  (180.3 cm) Weight: 101.4 kg (223 lb 8.7 oz)              Insurance Information HMO:     PPO:      PCP:      IPA:      80/20:      OTHER:  PRIMARY: Medicare A & B       Policy#: [id number]      Subscriber: Self CM Name:       Phone#:      Fax#:  Pre-Cert#: eligible per [employer]      Employer: Retired  Benefits:  Phone #:      Name:  Eff. Date: 12/18/10     Deduct: $1,316      Out of Pocket Max: None      Life Max: Unlimited  CIR: 100%      SNF: 100% days 1-20. 80% days 21-100  Outpatient: PT/OT/SLP 80%     Co-Pay: 20% Home Health: PT/OT/SLP      Co-Pay: $0 DME: 80     Co-Pay: 20% Providers: patient's choice   SECONDARY: Generic Commercial       Policy#: [id number]      Subscriber: Self CM Name:       Phone#:      Fax#:  Pre-Cert#:       Employer: Retired  Benefits:  Phone #: [phone]     Name:  Eff. Date:      Deduct:       Out of Pocket Max:       Life Max:  CIR:       SNF:  Outpatient:      Co-Pay:  Home Health:       Co-Pay:  DME:      Co-Pay:   Medicaid Application Date:       Case Manager:  Disability Application Date:       Case Worker:   Emergency Contact Information Contact Information    Name Relation Home Work Mobile   Dwight Significant other [phone]  [phone]     Current Medical History  Patient Admitting Diagnosis: Debility secondary to DKA and NSTEMI   History of Present Illness: Garcia Carrisalez Mooreis a 65 y.o.right handed malewith history of uncontrolled type 2 diabetes mellitus with peripheral neuropathy, CKD-3 baseline creatinine 1.90 - 2.00, TIA. Presented 11/15/2015 with progressive shuffling gait.Per chart review patient lives alone. Reportedly independent prior to admission still driving short distances.Patient was found down by significant other and could not speak. Findings of elevated  glucose greater than 700.Hemoglobin A1c of 10.MRI of the brain showed no evidence of acute infarct. MRI cervical spine with spondylosis and some canal narrowing from C4-5 through C7-T1 most pronounced at C4-5. Patient received 2 L of normal saline started on insulin infusion per protocol. Hypertensive systolic pressure [id number] as well as tachycardia 120s. Troponin 7.46. Creatinine on admission 3.30 from baseline around 2.00 and presently 3.17 with renal ultrasound showing no obstruction Nephrology consulted for follow-up in suspect prerenal etiology with hypoperfusion from hyperosmolar diuresis and perhaps ACE inhibitor. There was a cystic mass arising from the upper pole the right kidney measuring 2.5 x 2.8 x 2.2 cm. MRI suggested but could not be completed with contrast due to renal insufficiency and MR abdomen without contrast completed 11/20/2015 showing no identified renal mass. There was some noted renal cysts  and recommendations for reimaging in 3-6 months Echocardiogram with ejection fraction of 55% no wall motion abnormalities. EEG showed mild diffuse slowing of the electrocerebral activity, no seizure activity. Cardiology consulted for elevated troponin suspect NSTEMIwith Brilintaadded. Plan coronary angiography when patient medically stable and metabolic issues resolve, in the next 2-4 weeks.MBS 11/19/2015 maintained on a regular diet. Physical and occupational therapy evaluations completed 11/18/2015 with recommendations of physical medicine rehabilitation consult.Patient was admitted for a comprehensive rehabilitation program    Past Medical History  Past Medical History:  Diagnosis Date  . BPH (benign prostatic hyperplasia)   . Chronic kidney disease   . Depression   . Diabetes mellitus without complication ([place])   . GERD (gastroesophageal reflux disease)   . Hyperlipidemia   . Hypertension   . Hypothyroidism   . TIA (transient ischemic attack)     Family History  family history  includes Alcohol abuse in his father; CAD in his mother; CVA in his mother; Diabetes in his father and mother; Hyperlipidemia in his father and mother; Hypertension in his mother.  Prior Rehab/Hospitalizations:  Has the patient had major surgery during 100 days prior to admission? No  Current Medications   Current Facility-Administered Medications:  .  acetaminophen (TYLENOL) tablet 650 mg, 650 mg, Oral, Q6H PRN, [place], MD, 650 mg at 11/19/15 1626 .  aspirin EC tablet 81 mg, 81 mg, Oral, Daily, Neldon Labella, NP, 81 mg at 11/23/15 0847 .  bisacodyl (DULCOLAX) suppository 10 mg, 10 mg, Rectal, Daily PRN, Maryann Mikhail, DO .  carvedilol (COREG) tablet 12.5 mg, 12.5 mg, Oral, BID WC, Rogue Bussing, MD, 12.5 mg at 11/23/15 0902 .  Darbepoetin Alfa (ARANESP) injection 100 mcg, 100 mcg, Subcutaneous, Q Fri-1800, Rogue Bussing, MD, 100 mcg at 11/20/15 1808 .  donepezil (ARICEPT) tablet 10 mg, 10 mg, Oral, QHS, Javier Glazier, MD, 10 mg at 11/22/15 2233 .  feeding supplement (NEPRO CARB STEADY) liquid 237 mL, 237 mL, Oral, BID BM, Rogue Bussing, MD, 237 mL at 11/23/15 1400 .  folic acid (FOLVITE) tablet 1 mg, 1 mg, Oral, Daily, Javier Glazier, MD, 1 mg at 11/23/15 0847 .  heparin injection 5,000 Units, 5,000 Units, Subcutaneous, Q8H, Marijean Heath, NP, 5,000 Units at 11/22/15 1344 .  hydrALAZINE (APRESOLINE) injection 10 mg, 10 mg, Intravenous, Q4H PRN, Rhetta Mura Schorr, NP, 10 mg at 11/22/15 1613 .  HYDROcodone-acetaminophen (NORCO/VICODIN) 5-325 MG per tablet 1 tablet, 1 tablet, Oral, Q6H PRN, Cristal Ford, DO, 1 tablet at 11/23/15 0848 .  insulin aspart (novoLOG) injection 0-5 Units, 0-5 Units, Subcutaneous, QHS, Cherene Altes, MD, 2 Units at 11/22/15 2234 .  insulin aspart (novoLOG) injection 0-9 Units, 0-9 Units, Subcutaneous, TID WC, Cherene Altes, MD, 5 Units at 11/23/15 1250 .  insulin aspart (novoLOG) injection 4  Units, 4 Units, Subcutaneous, TID WC, Maryann Mikhail, DO .  insulin glargine (LANTUS) injection 10 Units, 10 Units, Subcutaneous, BID, Cherene Altes, MD, 10 Units at 11/23/15 0902 .  labetalol (NORMODYNE,TRANDATE) injection 10 mg, 10 mg, Intravenous, Q2H PRN, Jeryl Columbia, NP .  levothyroxine (SYNTHROID, LEVOTHROID) tablet 25 mcg, 25 mcg, Oral, QAC breakfast, Maryann Mikhail, DO, 25 mcg at 11/23/15 0846 .  montelukast (SINGULAIR) tablet 10 mg, 10 mg, Oral, QHS, Cherene Altes, MD, 10 mg at 11/22/15 2233 .  ondansetron (ZOFRAN) injection 4 mg, 4 mg, Intravenous, Q6H PRN, Lily Kocher, MD, 4 mg at 11/17/15 0232 .  pantoprazole (PROTONIX) EC  tablet 40 mg, 40 mg, Oral, Daily, Elmarie Shiley, MD, 40 mg at 11/23/15 0855 .  rosuvastatin (CRESTOR) tablet 20 mg, 20 mg, Oral, q1800, Adrian Prows, MD, 20 mg at 11/22/15 1732 .  thiamine (VITAMIN B-1) tablet 100 mg, 100 mg, Oral, Daily, Javier Glazier, MD, 100 mg at 11/23/15 0846 .  ticagrelor (BRILINTA) tablet 90 mg, 90 mg, Oral, BID, Adrian Prows, MD, 90 mg at 11/23/15 0847 .  torsemide (DEMADEX) tablet 20 mg, 20 mg, Oral, Daily, Rogue Bussing, MD, 20 mg at 11/23/15 0848  Patients Current Diet: Diet Carb Modified Fluid consistency: Thin; Room service appropriate? Yes  Precautions / Restrictions Precautions Precautions: Fall Restrictions Weight Bearing Restrictions: No   Has the patient had 2 or more falls or a fall with injury in the past year?No just the fall that lead to this admission   Prior Activity Level Community (5-7x/wk): Prior to admission patient was independent.  He relocated here from [place] about 4 years ago to be with his significant other.  He drove short distances and lived alone prior to admission per his report.  However, his significant other has been working on moving into the home and now reports that she has and they live together.  Patient reports that prior to admission he was supposed to use a walker and eat  better; this has been a wake up call for him and needing to take better care of himself.      Home Assistive Devices / Equipment Home Assistive Devices/Equipment: None Home Equipment: Walker - 4 wheels, [place] - single point  Prior Device Use: Indicate devices/aids used by the patient prior to current illness, exacerbation or injury? None, but was supposed to use a walker  Prior Functional Level Prior Function Level of Independence: Independent Comments: Drives. Reports that he speaks with girlfriend on the phone daily. Independent with ADL but does have difficulty getting off of low toilet.  Self Care: Did the patient need help bathing, dressing, using the toilet or eating?  Independent  Indoor Mobility: Did the patient need assistance with walking from room to room (with or without device)? Independent  Stairs: Did the patient need assistance with internal or external stairs (with or without device)? Independent  Functional Cognition: Did the patient need help planning regular tasks such as shopping or remembering to take medications? Independent  Current Functional Level Cognition  Overall Cognitive Status: No family/caregiver present to determine baseline cognitive functioning (questionable historian.  ) Orientation Level: Oriented X4    Extremity Assessment (includes Sensation/Coordination)  Upper Extremity Assessment: Defer to OT evaluation RUE Deficits / Details: Full ROM. Slightly decreased strength, grossly 4/5. LUE Deficits / Details: Pt with full ROM but limited assessment of strength due to L sided pain. LUE: Unable to fully assess due to pain  Lower Extremity Assessment: Generalized weakness, RLE deficits/detail, LLE deficits/detail RLE Deficits / Details: hip flex 4/5, knee ext 4/5, knee flex 4/5 RLE Coordination: decreased gross motor LLE Deficits / Details: difficult to assess due to painful left side with LE motion. hip flex 2+/5, knee ext 3/5, knee flex 3/5 LLE:  Unable to fully assess due to pain LLE Coordination: decreased gross motor    ADLs  Overall ADL's : Needs assistance/impaired Eating/Feeding: Set up, Sitting Grooming: Wash/dry hands, Wash/dry face, Set up Upper Body Bathing: Set up, Sitting Lower Body Bathing: Moderate assistance, Sit to/from stand Upper Body Dressing : Supervision/safety Lower Body Dressing: Supervision/safety Lower Body Dressing Details (indicate cue type and  reason): Limited by pain in L side. Toilet Transfer: Maximal assistance, RW, Buyer, retail Details (indicate cue type and reason): Simulated by sit to stand from chair with functional mobility. Functional mobility during ADLs: Minimal assistance, Rolling walker General ADL Comments: Pt needing mod assist for sit to stand from the bed secondary to weakness and pain.  Still reporting increased pain in the left side and LLE with transitional movements.  Max assist for supine to sit EOB as well.  Pt needs increased time to prepare for all functional movements.      Mobility  Overal bed mobility: Needs Assistance Bed Mobility: Supine to Sit Supine to sit: Max assist General bed mobility comments: Min assist for moving the LLE to the EOB with max assist for lifting trunk into sitting position.     Transfers  Overall transfer level: Needs assistance Equipment used: Rolling walker (2 wheeled) Transfers: Sit to/from Stand Sit to Stand: Mod assist General transfer comment: Mod assist for sit to stand from the bed and from the [facility].     Ambulation / Gait / Stairs / Wheelchair Mobility  Ambulation/Gait Ambulation/Gait assistance: Museum/gallery curator (Feet): 30 Feet (SHOB noted during gait SPO2 97% on RA HR 109 bpm.  ) Assistive device: 4-wheeled walker Gait Pattern/deviations: Trunk flexed, Decreased stride length, Step-to pattern General Gait Details: Pt refused to unlock rollator and stepped to in pattern due to pain.  Pt required cues for  upright posture and increasing stride length. Gait velocity: decreased Gait velocity interpretation: Below normal speed for age/gender    Posture / Balance Balance Overall balance assessment: Needs assistance Sitting-balance support: Feet supported Sitting balance-Leahy Scale: Fair Standing balance support: Bilateral upper extremity supported Standing balance-Leahy Scale: Poor Standing balance comment: Pt needs use of the RW for support in standing.     Special needs/care consideration BiPAP/CPAP: No CPM: No Continuous Drip IV: no Dialysis: No no  Life Vest: No Oxygen: No Special Bed: No Trach Size: No Wound Vac (area): No       Skin: Left upper extremity, skin tear; MASD buttocks, coccyx and sacrum                                  Bowel mgmt: last BM 11/22/15, continent Bladder mgmt: Intermittent incontinence  Diabetic mgmt: Yes prior to admission was testing blood sugar x2 daily and taking Novolog and Lantis      Previous Home Environment Living Arrangements: Alone Available Help at Discharge: Family, Friend(s), Available PRN/intermittently Type of Home: House Home Layout: One level Home Access: Stairs to enter Technical brewer of Steps: 3 Bathroom Shower/Tub: Multimedia programmer: [place]: No Additional Comments: pt reports that he is supposed to walk with his cane or rollator but that he doesn't  Discharge Living Setting  Plans for Discharge Living Setting: Patient's home, Lives with (comment) (significant other, Karen ) Type of Home at Discharge: House Discharge Home Layout: One level Discharge Home Access: Stairs to enter Entrance Stairs-Rails: None Entrance Stairs-Number of Steps: 2 Discharge Bathroom Shower/Tub: Walk-in shower (with step over ledge) Discharge Bathroom Toilet: Standard Discharge Bathroom Accessibility: Yes How Accessible: Accessible via walker Does the patient have any problems obtaining your medications?:  No  Social/Family/Support Systems Patient Roles: Other (Comment) (significant other ) Contact Information: Gertie Baron [phone] Anticipated Caregiver: Santiago Glad Anticipated Caregiver's Contact Information: see above  Ability/Limitations of Caregiver: None Caregiver Availability: 24/7 Discharge  Plan Discussed with Primary Caregiver: Yes Is Caregiver In Agreement with Plan?: Yes Does Caregiver/Family have Issues with Lodging/Transportation while Pt is in Rehab?: No  Goals/Additional Needs Patient/Family Goal for Rehab: PT Mod I; OT Sueprvision-Mod I; SLP Mod I  Expected length of stay: 10-14 days Cultural Considerations: None Dietary Needs: Carb Mod Equipment Needs: TBD Special Service Needs: Renal following  Additional Information: None Pt/Family Agrees to Admission and willing to participate: Yes Program Orientation Provided & Reviewed with Pt/Caregiver Including Roles  & Responsibilities: Yes Additional Information Needs: None Information Needs to be Provided By: N/A  Decrease burden of Care through IP rehab admission: No  Possible need for SNF placement upon discharge: No  Patient Condition: This patient's medical and functional status has changed since the consult dated: 11/18/15 in which the Rehabilitation Physician determined and documented that the patient's condition is appropriate for intensive rehabilitative care in an inpatient rehabilitation facility. See "History of Present Illness" (above) for medical update. Functional changes are: min assist bed mobility, mod assist transfers, min assist 30 with RW. Patient's medical and functional status update has been discussed with the Rehabilitation physician and patient remains appropriate for inpatient rehabilitation. Will admit to inpatient rehab today.  Preadmission Screen Completed By:  Gunnar Fusi, with updates by Gerlean Ren  11/23/2015 2:44 PM ______________________________________________________________________    Discussed status with Dr.  Posey Pronto on 11/23/15 at  1444  and received telephone approval for admission today.  Admission Coordinator:  Gerlean Ren, time [id number] /Date 11/23/15

## 2015-11-20 NOTE — Progress Notes (Signed)
[facility] Progress Note    Assessment/ Plan:   1. Acute on Chronic Renal Failure: Suspect this is A on CRF in the setting of volume depletion on ACE with baseline DN. However, given possibly nephrotic range protein and hematuria cannot rule out GN. Last SCr 1.90 in February, so elevated SCr could also represent chronic worsening in the setting of diabetic nephropathy with poorly controlled illnesses. However, given hematuria and seemingly huge amount of proteinuria cannot rule out other conditions such as a GN. Initial UA with 6-30 RBC per HPF; repeat 11/2 with TNC RBCs and granular casts. CK WNL. Spot urine protein 760 mg/dL. FENa 1.1%, consistent with intrinsic disease. Albumin 1.5 (was 1.9 on admission), which may represent poor nutritional status vs nephrotic syndrome /intrinsic kidney injury. Volume status +1.1 since admission s/p 2 doses of IV lasix and fluid overloaded on exam. Recommend IV lasix 40 mg BID until fluid balance normalized. Will get serologies: C3, C4, ANA, ANCA. Obtain HIV, hepatitis panel.  2. HTN: Still hypertensive. Increased carvedilol to 12.5 BID 11/2. Continue lasix 40 mg IV BID.  3. Anemia: Hgb as high as 12.5 on 02/11/15 via [place]. Agree with iron studies, which showed severe deficiency. Ordered feraheme to be given while inpatient. Will start aranesp weekly, as well.  4. Elevated troponin: Concern for NSTEMI. Cardiology following. Agree with abstaining from contrast for cath until patient medically improved.   5. Diabetes: Per primary team. Hgb A1c 10.0, worse from hgb A1c 8.1 07/16/15. Home regimen of tradjenta and novolog 70/30 80 U with breakfast, 72 U with dinner. Improved CBGs on 10 U lantus BID, meal coverage and SSI.  Subjective:   Patient has had BRB in urine noted overnight. He says he was urinating all night with lasix. He had some abdominal pain improved by 2 BMs overnight.    Objective:   BP (!) 153/84 (BP Location: Right Arm)    Pulse 72   Temp 98.5 F (36.9 C) (Oral)   Resp 18   Ht 5\' 11"  (1.803 m)   Wt 223 lb 8.7 oz (101.4 kg)   SpO2 99%   BMI 31.18 kg/m   Intake/Output Summary (Last 24 hours) at 11/20/15 0757 Last data filed at 11/20/15 [id number]  Gross per 24 hour  Intake              597 ml  Output              975 ml  Net             -378 ml   Weight change:   Physical Exam: General: Overweight male, resting in bed HENT: MMM Cardiac: RRR, S1, S2, no m/r/g Lungs: CTAB, no increased WOB, on RA, speaking in complete sentences Abdomen: +BS, soft, mildly distended and mildly tender over LQs MSK: 1+ pitting edema to thighs Neuro: AOx3, moves all extremities spontaneously Psych: Strange affect.   Imaging: US Renal  Result Date: 11/19/2015 CLINICAL DATA:  Acute renal insufficiency EXAM: RENAL / URINARY TRACT ULTRASOUND COMPLETE COMPARISON:  None. FINDINGS: Right Kidney: Length: 12.2 cm. Echogenicity is increased. Renal cortical thickness is normal. No perinephric fluid or hydronephrosis visualized. There is a cystic mass arising from the upper pole of the right kidney measuring 2.5 x 2.8 x 2.2 cm. Within this cystic mass, there is a solid-appearing focus measuring 1.4 x 1.2 x 1.2 cm. There is no sonographically demonstrable calculus or ureterectasis. Left Kidney: Length: 11.6 cm. Echogenicity is increased. Renal cortical thickness  is normal. No perinephric fluid or hydronephrosis visualized. There is a cyst arising from the left kidney measuring 3.0 x 2.6 x 2.8 cm. No sonographically demonstrable calculus or ureterectasis. Bladder: Appears normal for degree of bladder distention. IMPRESSION: Kidneys are echogenic consistent with medical renal disease. No obstructing focus identified on either side. There is a cystic lesion arising from the upper pole of the right kidney which contains a solid focus. This appearance raises concern for potential renal neoplasm with a cystic component. Further evaluation with pre and  post contrast MRI or CT should be considered. MRI is preferred in younger patients (due to lack of ionizing radiation) and for evaluating calcified lesion(s). Given the elevated creatinine, MRI may be a better choice for further assessment if there is no contraindication to MR imaging. There is a simple cyst in the left kidney. These results will be called to the ordering clinician or representative by the Radiologist Assistant, and communication documented in the PACS or zVision Dashboard. Electronically Signed   By: Lowella Grip III M.D.   On: 11/19/2015 14:57     Labs: BMET  Recent Labs Lab 11/16/15 0630 11/16/15 0914 11/16/15 1800 11/17/15 0220 11/18/15 0404 11/19/15 0737 11/20/15 0326  NA 139 137 137 138 135 134* 135  K 3.7 3.3* 3.8 3.5 3.8 3.6 3.6  CL 114* 113* 114* 114* 111 109 108  CO2 18* 17* 16* 17* 16* 18* 20*  GLUCOSE 154* 104* 169* 193* 229* 90 87  BUN 38* 37* 37* 37* 41* 43* 50*  CREATININE 3.25* 3.04* 3.14* 3.13* 3.20* 3.17* 3.44*  CALCIUM 8.1* 8.2* 8.3* 8.4* 8.5* 8.4* 8.3*  PHOS  --   --   --  4.9* 5.1* 4.7*  --    CBC  Recent Labs Lab 11/15/15 1830  11/15/15 2328  11/17/15 0220 11/17/15 1653 11/18/15 0404 11/19/15 0737 11/20/15 0326  WBC 13.7*  --  9.9  --  11.5*  --  11.3* 9.2 8.9  NEUTROABS 11.8*  --  7.3  --  6.6  --  6.4  --   --   HGB 9.0*  < > 8.5*  < > 7.6* 7.9* 7.7* 7.8* 7.7*  HCT 31.1*  < > 26.2*  < > 23.4* 25.3* 24.0* 24.5* 23.8*  MCV 94.8  --  85.3  --  86.7  --  87.0 86.9 86.5  PLT 399  --  335  --  299  --  337 369 352  < > = values in this interval not displayed.  Medications:    . aspirin EC  81 mg Oral Daily  . carvedilol  12.5 mg Oral BID WC  . darbepoetin (ARANESP) injection - NON-DIALYSIS  100 mcg Subcutaneous Q Fri-1800  . donepezil  10 mg Oral QHS  . ferumoxytol  510 mg Intravenous Weekly  . folic acid  1 mg Oral Daily  . furosemide  40 mg Intravenous BID  . heparin subcutaneous  5,000 Units Subcutaneous Q8H  . insulin  aspart  0-5 Units Subcutaneous QHS  . insulin aspart  0-9 Units Subcutaneous TID WC  . insulin aspart  3 Units Subcutaneous TID WC  . insulin glargine  10 Units Subcutaneous BID  . levothyroxine  25 mcg Oral QAC breakfast  . montelukast  10 mg Oral QHS  . pantoprazole  40 mg Oral BID  . rosuvastatin  20 mg Oral q1800  . thiamine  100 mg Oral Daily  . ticagrelor  90 mg Oral BID  Olene Floss, MD [place], PGY-2 11/20/2015, 7:57 AM   I have personally seen and examined this patient and agree with the assessment/plan as outlined above by Ola Spurr MD (PGY2).Available data base pointing towards hemodynamically mediated acute on chronic renal insufficiency likely with ATN. Concern raised with dipstick positive proteinuria and hematuria and we will screen him for GN including chronic hepatitis and HIV infections. Will rule out plasma cell dyscrasia with SPEP and free light chains. Given continued worsening of renal function, screen for vasculitis with ANCA titer. He reports that hematuria is not physically present at this time and urine appears to be clear upon consequent voiding. Renal ultrasound results reviewed-significant for a cystic mass in the right upper pole of the kidney that warrants further evaluation with repeat renal ultrasound versus MRI for possible evaluation of RCC. Ryker Pherigo K.,MD 11/20/2015 10:31 AM

## 2015-11-21 ENCOUNTER — Inpatient Hospital Stay (HOSPITAL_COMMUNITY): Payer: MEDICARE

## 2015-11-21 LAB — BASIC METABOLIC PANEL
ANION GAP: 7 (ref 5–15)
BUN: 51 mg/dL — ABNORMAL HIGH (ref 6–20)
CALCIUM: 8.3 mg/dL — AB (ref 8.9–10.3)
CHLORIDE: 111 mmol/L (ref 101–111)
CO2: 20 mmol/L — AB (ref 22–32)
Creatinine, Ser: 3.49 mg/dL — ABNORMAL HIGH (ref 0.61–1.24)
GFR calc non Af Amer: 17 mL/min — ABNORMAL LOW (ref >60)
GFR, EST AFRICAN AMERICAN: 20 mL/min — AB (ref >60)
GLUCOSE: 31 mg/dL — AB (ref 65–99)
Potassium: 3.5 mmol/L (ref 3.5–5.1)
Sodium: 138 mmol/L (ref 135–145)

## 2015-11-21 LAB — MPO/PR-3 (ANCA) ANTIBODIES

## 2015-11-21 LAB — GLUCOSE, CAPILLARY
GLUCOSE-CAPILLARY: 115 mg/dL — AB (ref 65–99)
GLUCOSE-CAPILLARY: 236 mg/dL — AB (ref 65–99)
Glucose-Capillary: 32 mg/dL — CL (ref 65–99)
Glucose-Capillary: 158 mg/dL — ABNORMAL HIGH (ref 65–99)
Glucose-Capillary: 184 mg/dL — ABNORMAL HIGH (ref 65–99)

## 2015-11-21 LAB — CULTURE, BLOOD (ROUTINE X 2)
Culture: NO GROWTH
Culture: NO GROWTH

## 2015-11-21 LAB — HEPATITIS PANEL, ACUTE
HCV Ab: <0.1 s/co ratio (ref 0.0–0.9)
Hep A IgM: NEGATIVE
Hep B C IgM: NEGATIVE
Hepatitis B Surface Ag: NEGATIVE

## 2015-11-21 LAB — C4 COMPLEMENT: Complement C4, Body Fluid: 22 mg/dL (ref 14–44)

## 2015-11-21 LAB — C3 COMPLEMENT: C3 Complement: 110 mg/dL (ref 82–167)

## 2015-11-21 LAB — HIV ANTIBODY (ROUTINE TESTING W REFLEX): HIV Screen 4th Generation wRfx: NONREACTIVE

## 2015-11-21 MED ORDER — TORSEMIDE 20 MG PO TABS
40.0000 mg | ORAL_TABLET | Freq: Every day | ORAL | Status: DC
  Administered 2015-11-22: 40 mg via ORAL
  Filled 2015-11-21: qty 2

## 2015-11-21 MED ORDER — DEXTROSE 50 % IV SOLN
50.0000 mL | Freq: Once | INTRAVENOUS | Status: AC
  Administered 2015-11-21: 50 mL via INTRAVENOUS

## 2015-11-21 MED ORDER — DEXTROSE 50 % IV SOLN
INTRAVENOUS | Status: AC
  Filled 2015-11-21: qty 50

## 2015-11-21 MED ORDER — PANTOPRAZOLE SODIUM 40 MG PO TBEC
40.0000 mg | DELAYED_RELEASE_TABLET | Freq: Every day | ORAL | Status: DC
  Administered 2015-11-22 – 2015-11-23 (×2): 40 mg via ORAL
  Filled 2015-11-21 (×2): qty 1

## 2015-11-21 NOTE — Progress Notes (Signed)
PROGRESS NOTE    Dillon Thomas  Dillon Thomas DOB: [birth year] DOA: 11/15/2015 PCP: [name], MD   Chief Complaint  Patient presents with  . Altered Mental Status  . Hyperglycemia    Brief Narrative:  HPI 11/15/2015 by Dr. Lily Kocher Dillon Thomas is a 65 y.o. gentleman with a history of uncontrolled type 2 DM, CKD 3 (baseline creatinine around 2), peripheral neuropathy, prior TIA, GERD, and hypothyroidism who has had progressive gait instability ("shuffling gait" per his significant other), back pain, and speech difficulties (referred to neurology at [facility] in the summer; he has seen speech therapy as an outpatient; last MRI in the [facility] system was in 2015) who was last known to be in his baseline state of health around 11:30AM this morning.  Presently, he is obtunded and unable to engage in any type of effective communication.  This history of taken from his significant other, ED documentation, and outside medical records.  The patient's significant other found him down at home around 5PM. The patient was making sounds with his mouth but could not speak.  He was incontinent of urine.  He was staring into space.  She immediately called 911. Assessment & Plan   HHNK -Appears to have resolved  Diabetes mellitus, type II, uncontrolled -Hemoglobin A1c 10 -Continue Lantus, insulin sliding scale, CBG monitoring  Acute on chronic kidney disease, stage III -Baseline creatinine appears to be 1.5-1.9 -Called patient's PCP, creatinine on 03/04/2015 was 1.9 -Upon admission, creatinine was 3.48. Currently 3.49 -Possibly due to dehydration from HHNK versus ATN vs GN -Renal US: no obstructing focus.   -Nephrology consulted and appreciated- pending ANA, ANCA -C3 and C4 WNL, hepatitis panel unremarkable, HIV nonreactive -Nephrology restarted lasix and transitioned to torsemide 40mg  BID  -Possibly decrease torsemide to daily   Right Kidney Cystic Lesion -noted on renal US -Spoke  with Dr. Posey Pronto, nephrology.  Although MRI with contrast is recommended, given patient's current creatinine, will order MR without contrast- pending  Normocytic Anemia -Hemoglobin currently 7.7, appears to have been higher upon admission. -Drop may be secondary to dilutional component -Anemia panel: iron 10 -feraheme ordered -Nephrology will also start weekly aranesp -Per family, patient has been having loose stools for the past month and has been very dark/black in color. -Patient supposedly did have a colonoscopy a few years ago in [place]. States he has chronic anemia. -Continue to monitor CBC  Hypothyroidism -TSH 2.954, free T4 1 0.02 -Called patient's PCP, takes synthroid 52mcg daily  -Continue synthroid  Elevated Troponin/NSTEMI -Cardiology consulted and appreciated -Echocardiogram: EF of 50-55%, left ventricular diastolic function parameters were normal. -Plan for cardiac cath in 2-4 weeks  Uncontrolled Hypertension -Continue Coreg and hydralazine as needed  Acute Encephalopathy -Likely multifactorial -Appears to improving -EEG showed no deformity activity -MRI brain without contrast: No evidence of acute infarction. Extensive extra-axial mass lesion along the floor of the anterior cranial fossa/planum sphenoidale region most consistent with meningioma  Possible meningioma -Seen on MRI brain without contrast. Patient will need contrast for further evaluation. However given patient's renal function, will likely be done as an outpatient.  Possible seizure/history of CVA -Specific cardiac home, patient is back to his baseline speech. He does have some gait disturbances. -EEG conducted, unremarkable -Inpatient rehabilitation consulted and appreciated  GERD -Continue PPI  Hypomagnesemia/hypokalemia/hyponatremia -Resolved, continue to monitor BMP  DVT Prophylaxis  heparin  Code Status: Full  Family Communication: None at bedside.   Disposition Plan: Admitted.  Continue to work up anemia,  AKI. Possible discharge to inpatient rehab on 11/6  Consultants PCCM Cardiology Neurology Nephrology Inpatient rehab  Procedures  Echocardiogram EEG  Antibiotics   Anti-infectives    None      Subjective:   Dillon Thomas seen and examined today.  Patient feels better this morning.  Continues to complain of pain, states he has not had any pain medication since 6pm last night. Denies chest pain, shortness of breath, abdominal pain, nausea or vomiting.     Objective:   Vitals:   11/20/15 0609 11/20/15 1535 11/20/15 2125 11/21/15 0525  BP: (!) 153/84 (!) 137/57 (!) 148/61 (!) 152/67  Pulse: 72 80 78 73  Resp: 18  18 18   Temp: 98.5 F (36.9 C) 97.4 F (36.3 C) 97.8 F (36.6 C) 97.5 F (36.4 C)  TempSrc: Oral Oral Oral Oral  SpO2: 99% 100% 99% 99%  Weight:      Height:        Intake/Output Summary (Last 24 hours) at 11/21/15 1156 Last data filed at 11/21/15 1028  Gross per 24 hour  Intake              440 ml  Output             1000 ml  Net             -560 ml   Filed Weights   11/15/15 1827 11/17/15 1737 11/18/15 2307  Weight: 99.8 kg (220 lb) 101.8 kg (224 lb 6.9 oz) 101.4 kg (223 lb 8.7 oz)    Exam  General: Well developed, ill appearing, NAD, appears stated age  HEENT: NCAT, mucous membranes moist.   Cardiovascular: S1 S2 auscultated, RRR, no murmurs  Respiratory: Clear to auscultation bilaterally with equal chest rise  Abdomen: Soft, obese, mildly distended, mild TTP lower abdomen, + bowel sounds  Extremities: warm dry without cyanosis clubbing. +LE Edema  Neuro: AAOx3, nonfocal  Psych: Normal affect and demeanor, pleasant   Data Reviewed: I have personally reviewed following labs and imaging studies  CBC:  Recent Labs Lab 11/15/15 1830  11/15/15 2328  11/17/15 0220 11/17/15 1653 11/18/15 0404 11/19/15 0737 11/20/15 0326  WBC 13.7*  --  9.9  --  11.5*  --  11.3* 9.2 8.9  NEUTROABS 11.8*  --  7.3  --  6.6   --  6.4  --   --   HGB 9.0*  < > 8.5*  < > 7.6* 7.9* 7.7* 7.8* 7.7*  HCT 31.1*  < > 26.2*  < > 23.4* 25.3* 24.0* 24.5* 23.8*  MCV 94.8  --  85.3  --  86.7  --  87.0 86.9 86.5  PLT 399  --  335  --  299  --  337 369 352  < > = values in this interval not displayed. Basic Metabolic Panel:  Recent Labs Lab 11/17/15 0220 11/18/15 0404 11/19/15 0737 11/20/15 0326 11/21/15 0542  NA 138 135 134* 135 138  K 3.5 3.8 3.6 3.6 3.5  CL 114* 111 109 108 111  CO2 17* 16* 18* 20* 20*  GLUCOSE 193* 229* 90 87 31*  BUN 37* 41* 43* 50* 51*  CREATININE 3.13* 3.20* 3.17* 3.44* 3.49*  CALCIUM 8.4* 8.5* 8.4* 8.3* 8.3*  MG 1.4* 1.8  --   --   --   PHOS 4.9* 5.1* 4.7*  --   --    GFR: Estimated Creatinine Clearance: 25.2 mL/min (by C-G formula based on SCr of 3.49 mg/dL (H)). Liver Function Tests:  Recent Labs Lab 11/15/15 1830 11/15/15 2328 11/17/15 0220 11/18/15 0404 11/19/15 0737  AST 23 29  --   --   --   ALT 14* 14*  --   --   --   ALKPHOS 94 82  --   --   --   BILITOT 0.6 0.3  --   --   --   PROT 7.2 6.9  --   --   --   ALBUMIN 1.9* 1.8* 1.5* 1.6* 1.5*   No results for input(s): LIPASE, AMYLASE in the last 168 hours.  Recent Labs Lab 11/15/15 1830  AMMONIA 19   Coagulation Profile:  Recent Labs Lab 11/15/15 1830  INR 1.36   Cardiac Enzymes:  Recent Labs Lab 11/15/15 2156 11/16/15 0257 11/16/15 0630 11/16/15 0914 11/20/15 0326  CKTOTAL  --   --   --   --  55  TROPONINI 0.89* 5.34* 7.46* 7.46*  --    BNP (last 3 results) No results for input(s): PROBNP in the last 8760 hours. HbA1C: No results for input(s): HGBA1C in the last 72 hours. CBG:  Recent Labs Lab 11/20/15 1706 11/20/15 2130 11/21/15 0830 11/21/15 0927 11/21/15 1152  GLUCAP 155* 171* 32* 115* 158*   Lipid Profile: No results for input(s): CHOL, HDL, LDLCALC, TRIG, CHOLHDL, LDLDIRECT in the last 72 hours. Thyroid Function Tests: No results for input(s): TSH, T4TOTAL, FREET4, T3FREE,  THYROIDAB in the last 72 hours. Anemia Panel:  Recent Labs  11/19/15 1132  VITAMINB12 378  FOLATE 21.5  FERRITIN 189  TIBC 146*  IRON 10*  RETICCTPCT 2.7   Urine analysis:    Component Value Date/Time   COLORURINE ORANGE (A) 11/19/2015 1340   APPEARANCEUR CLOUDY (A) 11/19/2015 1340   APPEARANCEUR Clear 01/09/2013 1858   LABSPEC 1.017 11/19/2015 1340   LABSPEC 1.015 01/09/2013 1858   PHURINE 5.5 11/19/2015 1340   GLUCOSEU 250 (A) 11/19/2015 1340   GLUCOSEU >=500 01/09/2013 1858   HGBUR LARGE (A) 11/19/2015 1340   BILIRUBINUR NEGATIVE 11/19/2015 1340   BILIRUBINUR Negative 01/09/2013 1858   KETONESUR NEGATIVE 11/19/2015 1340   PROTEINUR >300 (A) 11/19/2015 1340   NITRITE NEGATIVE 11/19/2015 1340   LEUKOCYTESUR NEGATIVE 11/19/2015 1340   LEUKOCYTESUR Negative 01/09/2013 1858   Sepsis Labs: @LABRCNTIP (procalcitonin:4,lacticidven:4)  ) Recent Results (from the past 240 hour(s))  MRSA PCR Screening     Status: None   Collection Time: 11/16/15  4:45 AM  Result Value Ref Range Status   MRSA by PCR NEGATIVE NEGATIVE Final    Comment:        The GeneXpert MRSA Assay (FDA approved for NASAL specimens only), is one component of a comprehensive MRSA colonization surveillance program. It is not intended to diagnose MRSA infection nor to guide or monitor treatment for MRSA infections.   Culture, Urine     Status: None   Collection Time: 11/16/15 11:25 AM  Result Value Ref Range Status   Specimen Description URINE, CATHETERIZED  Final   Special Requests Normal  Final   Culture NO GROWTH  Final   Report Status 11/17/2015 FINAL  Final  Culture, blood (routine x 2)     Status: None (Preliminary result)   Collection Time: 11/16/15 12:15 PM  Result Value Ref Range Status   Specimen Description BLOOD RIGHT HAND  Final   Special Requests IN PEDIATRIC BOTTLE 3ML  Final   Culture NO GROWTH 4 DAYS  Final   Report Status PENDING  Incomplete  Culture, blood (routine x 2)  Status: None (Preliminary result)   Collection Time: 11/16/15 12:20 PM  Result Value Ref Range Status   Specimen Description BLOOD LEFT HAND  Final   Special Requests IN PEDIATRIC BOTTLE 3ML  Final   Culture NO GROWTH 4 DAYS  Final   Report Status PENDING  Incomplete      Radiology Studies: US Renal  Result Date: 11/19/2015 CLINICAL DATA:  Acute renal insufficiency EXAM: RENAL / URINARY TRACT ULTRASOUND COMPLETE COMPARISON:  None. FINDINGS: Right Kidney: Length: 12.2 cm. Echogenicity is increased. Renal cortical thickness is normal. No perinephric fluid or hydronephrosis visualized. There is a cystic mass arising from the upper pole of the right kidney measuring 2.5 x 2.8 x 2.2 cm. Within this cystic mass, there is a solid-appearing focus measuring 1.4 x 1.2 x 1.2 cm. There is no sonographically demonstrable calculus or ureterectasis. Left Kidney: Length: 11.6 cm. Echogenicity is increased. Renal cortical thickness is normal. No perinephric fluid or hydronephrosis visualized. There is a cyst arising from the left kidney measuring 3.0 x 2.6 x 2.8 cm. No sonographically demonstrable calculus or ureterectasis. Bladder: Appears normal for degree of bladder distention. IMPRESSION: Kidneys are echogenic consistent with medical renal disease. No obstructing focus identified on either side. There is a cystic lesion arising from the upper pole of the right kidney which contains a solid focus. This appearance raises concern for potential renal neoplasm with a cystic component. Further evaluation with pre and post contrast MRI or CT should be considered. MRI is preferred in younger patients (due to lack of ionizing radiation) and for evaluating calcified lesion(s). Given the elevated creatinine, MRI may be a better choice for further assessment if there is no contraindication to MR imaging. There is a simple cyst in the left kidney. These results will be called to the ordering clinician or representative by the  Radiologist Assistant, and communication documented in the PACS or zVision Dashboard. Electronically Signed   By: Lowella Grip III M.D.   On: 11/19/2015 14:57     Scheduled Meds: . aspirin EC  81 mg Oral Daily  . carvedilol  12.5 mg Oral BID WC  . darbepoetin (ARANESP) injection - NON-DIALYSIS  100 mcg Subcutaneous Q Fri-1800  . dextrose      . donepezil  10 mg Oral QHS  . ferumoxytol  510 mg Intravenous Weekly  . folic acid  1 mg Oral Daily  . heparin subcutaneous  5,000 Units Subcutaneous Q8H  . insulin aspart  0-5 Units Subcutaneous QHS  . insulin aspart  0-9 Units Subcutaneous TID WC  . insulin aspart  3 Units Subcutaneous TID WC  . insulin glargine  10 Units Subcutaneous BID  . levothyroxine  25 mcg Oral QAC breakfast  . montelukast  10 mg Oral QHS  . [START ON 11/22/2015] pantoprazole  40 mg Oral Daily  . rosuvastatin  20 mg Oral q1800  . thiamine  100 mg Oral Daily  . ticagrelor  90 mg Oral BID  . [START ON 11/22/2015] torsemide  40 mg Oral Daily   Continuous Infusions:    LOS: 6 days   Time Spent in minutes   30 minutes  Angelo Caroll D.O. on 11/21/2015 at 11:56 AM  Between 7am to 7pm - Pager - [phone]  After 7pm go to [email or web address] - password [email or web address]  And look for the night coverage person covering for me after hours  [facility] Office  [phone]

## 2015-11-21 NOTE — Progress Notes (Signed)
[facility] Progress Note    Assessment/ Plan:   1. Acute on Chronic Renal Failure: Suspect this is A on CRF in the setting of volume depletion on ACE with baseline DN. However, given possibly nephrotic range protein and hematuria cannot rule out GN. Last SCr 1.90 in February, so elevated SCr could also represent chronic worsening in the setting of diabetic nephropathy with poorly controlled illnesses. However, given hematuria and  proteinuria cannot rule out other conditions such as a GN. Initial UA with 6-30 RBC per HPF; repeat 11/2 with TNC RBCs and granular casts. CK WNL. Spot urine protein 760 mg/dL. FENa 1.1%, consistent with intrinsic disease. Albumin 1.5 (was 1.9 on admission), which may represent poor nutritional status vs nephrotic syndrome /intrinsic kidney injury. Currently on Demedax 40 BID PO - will decrease to once daily.  Serologies: ANA, ANCA pending. Though C3, C4,  HIV, hepatitis panel negative 2. HTN: Still hypertensive. Increased carvedilol to 12.5 BID 11/2. Continue lasix 40 mg IV BID.  3. Anemia: Hgb as high as 12.5 on 02/11/15 via [place]. Agree with iron studies, which showed severe deficiency. Ordered feraheme to be given while inpatient. Will start aranesp weekly, as well.  4. Elevated troponin: Concern for NSTEMI. Cardiology following. Agree with abstaining from contrast for cath until patient medically improved.   5. Diabetes: Per primary team. Hgb A1c 10.0, worse from hgb A1c 8.1 07/16/15. Home regimen of tradjenta and novolog 70/30 80 U with breakfast, 72 U with dinner. Improved CBGs on 10 U lantus BID, meal coverage and SSI.  Subjective:   Patient has no complaints this morning. Indicates urine now remains clear. Denies any dyspnea    Objective:   BP (!) 152/67 (BP Location: Left Arm)   Pulse 73   Temp 97.5 F (36.4 C) (Oral)   Resp 18   Ht 5\' 11"  (1.803 m)   Wt 223 lb 8.7 oz (101.4 kg)   SpO2 99%   BMI 31.18 kg/m   Intake/Output  Summary (Last 24 hours) at 11/21/15 0911 Last data filed at 11/21/15 [id number]  Gross per 24 hour  Intake              240 ml  Output             1200 ml  Net             -960 ml   Weight change:   Physical Exam: General: Overweight male, resting in bed Cardiac: RRR, S1, S2, no m/r/g Lungs: CTAB, no increased WOB,  MSK: 1+ pitting edema to thighs and 2+ pitting edema to mid-calves  Neuro: AOx3, moves all extremities spontaneously  Imaging: US Renal  Result Date: 11/19/2015 CLINICAL DATA:  Acute renal insufficiency EXAM: RENAL / URINARY TRACT ULTRASOUND COMPLETE COMPARISON:  None. FINDINGS: Right Kidney: Length: 12.2 cm. Echogenicity is increased. Renal cortical thickness is normal. No perinephric fluid or hydronephrosis visualized. There is a cystic mass arising from the upper pole of the right kidney measuring 2.5 x 2.8 x 2.2 cm. Within this cystic mass, there is a solid-appearing focus measuring 1.4 x 1.2 x 1.2 cm. There is no sonographically demonstrable calculus or ureterectasis. Left Kidney: Length: 11.6 cm. Echogenicity is increased. Renal cortical thickness is normal. No perinephric fluid or hydronephrosis visualized. There is a cyst arising from the left kidney measuring 3.0 x 2.6 x 2.8 cm. No sonographically demonstrable calculus or ureterectasis. Bladder: Appears normal for degree of bladder distention. IMPRESSION: Kidneys are echogenic consistent with  medical renal disease. No obstructing focus identified on either side. There is a cystic lesion arising from the upper pole of the right kidney which contains a solid focus. This appearance raises concern for potential renal neoplasm with a cystic component. Further evaluation with pre and post contrast MRI or CT should be considered. MRI is preferred in younger patients (due to lack of ionizing radiation) and for evaluating calcified lesion(s). Given the elevated creatinine, MRI may be a better choice for further assessment if there is no  contraindication to MR imaging. There is a simple cyst in the left kidney. These results will be called to the ordering clinician or representative by the Radiologist Assistant, and communication documented in the PACS or zVision Dashboard. Electronically Signed   By: Lowella Grip III M.D.   On: 11/19/2015 14:57     Labs: BMET  Recent Labs Lab 11/16/15 0914 11/16/15 1800 11/17/15 0220 11/18/15 0404 11/19/15 0737 11/20/15 0326 11/21/15 0542  NA 137 137 138 135 134* 135 138  K 3.3* 3.8 3.5 3.8 3.6 3.6 3.5  CL 113* 114* 114* 111 109 108 111  CO2 17* 16* 17* 16* 18* 20* 20*  GLUCOSE 104* 169* 193* 229* 90 87 31*  BUN 37* 37* 37* 41* 43* 50* 51*  CREATININE 3.04* 3.14* 3.13* 3.20* 3.17* 3.44* 3.49*  CALCIUM 8.2* 8.3* 8.4* 8.5* 8.4* 8.3* 8.3*  PHOS  --   --  4.9* 5.1* 4.7*  --   --    CBC  Recent Labs Lab 11/15/15 1830  11/15/15 2328  11/17/15 0220 11/17/15 1653 11/18/15 0404 11/19/15 0737 11/20/15 0326  WBC 13.7*  --  9.9  --  11.5*  --  11.3* 9.2 8.9  NEUTROABS 11.8*  --  7.3  --  6.6  --  6.4  --   --   HGB 9.0*  < > 8.5*  < > 7.6* 7.9* 7.7* 7.8* 7.7*  HCT 31.1*  < > 26.2*  < > 23.4* 25.3* 24.0* 24.5* 23.8*  MCV 94.8  --  85.3  --  86.7  --  87.0 86.9 86.5  PLT 399  --  335  --  299  --  337 369 352  < > = values in this interval not displayed.  Medications:    . dextrose      . aspirin EC  81 mg Oral Daily  . carvedilol  12.5 mg Oral BID WC  . darbepoetin (ARANESP) injection - NON-DIALYSIS  100 mcg Subcutaneous Q Fri-1800  . donepezil  10 mg Oral QHS  . ferumoxytol  510 mg Intravenous Weekly  . folic acid  1 mg Oral Daily  . heparin subcutaneous  5,000 Units Subcutaneous Q8H  . insulin aspart  0-5 Units Subcutaneous QHS  . insulin aspart  0-9 Units Subcutaneous TID WC  . insulin aspart  3 Units Subcutaneous TID WC  . insulin glargine  10 Units Subcutaneous BID  . levothyroxine  25 mcg Oral QAC breakfast  . montelukast  10 mg Oral QHS  . pantoprazole  40  mg Oral BID  . rosuvastatin  20 mg Oral q1800  . thiamine  100 mg Oral Daily  . ticagrelor  90 mg Oral BID  . torsemide  40 mg Oral BID   Kerrin Mo, MD [place], PGY-2 11/21/2015, 9:11 AM

## 2015-11-21 NOTE — Progress Notes (Signed)
CRITICAL VALUE ALERT  Critical value received:  Glucose = 31  Date of notification:  11/21/2015  Time of notification: 07:49  Critical value read back:Yes.    Nurse who received alert:  Alphonsus Sias  MD notified (1st page):  Dr. Ree Kida  Time of first page:  07:50  MD notified (2nd page):  Time of second page:  Responding MD:  Dr. Ree Kida  Time MD responded:  07:52

## 2015-11-21 NOTE — Progress Notes (Signed)
Patient in MRI when lab called RN with a critical result - Glucose=31; MD made aware and when the patient was back on the floor, his CBG was rechecked and found to be 32. Per protocol, patient received IV Dextrose 50% - 50 mll with good effect. When rechecked, CBG was 115. Patient with no s/s of hypoglycemia. Will continue to monitor.

## 2015-11-22 LAB — BASIC METABOLIC PANEL
Anion gap: 8 (ref 5–15)
BUN: 51 mg/dL — AB (ref 6–20)
CHLORIDE: 108 mmol/L (ref 101–111)
CO2: 21 mmol/L — ABNORMAL LOW (ref 22–32)
Calcium: 7.9 mg/dL — ABNORMAL LOW (ref 8.9–10.3)
Creatinine, Ser: 3.6 mg/dL — ABNORMAL HIGH (ref 0.61–1.24)
GFR, EST AFRICAN AMERICAN: 19 mL/min — AB (ref >60)
GFR, EST NON AFRICAN AMERICAN: 16 mL/min — AB (ref >60)
Glucose, Bld: 177 mg/dL — ABNORMAL HIGH (ref 65–99)
POTASSIUM: 3.7 mmol/L (ref 3.5–5.1)
SODIUM: 137 mmol/L (ref 135–145)

## 2015-11-22 LAB — CBC
HEMATOCRIT: 23.1 % — AB (ref 39.0–52.0)
HEMOGLOBIN: 7.3 g/dL — AB (ref 13.0–17.0)
MCH: 27.7 pg (ref 26.0–34.0)
MCHC: 31.6 g/dL (ref 30.0–36.0)
MCV: 87.5 fL (ref 78.0–100.0)
Platelets: 422 10*3/uL — ABNORMAL HIGH (ref 150–400)
RBC: 2.64 MIL/uL — AB (ref 4.22–5.81)
RDW: 14.8 % (ref 11.5–15.5)
WBC: 9.5 10*3/uL (ref 4.0–10.5)

## 2015-11-22 LAB — GLUCOSE, CAPILLARY
GLUCOSE-CAPILLARY: 155 mg/dL — AB (ref 65–99)
GLUCOSE-CAPILLARY: 198 mg/dL — AB (ref 65–99)
GLUCOSE-CAPILLARY: 326 mg/dL — AB (ref 65–99)
Glucose-Capillary: 239 mg/dL — ABNORMAL HIGH (ref 65–99)

## 2015-11-22 LAB — PREPARE RBC (CROSSMATCH)

## 2015-11-22 MED ORDER — TORSEMIDE 20 MG PO TABS
20.0000 mg | ORAL_TABLET | Freq: Every day | ORAL | Status: DC
  Administered 2015-11-23: 20 mg via ORAL
  Filled 2015-11-22: qty 1

## 2015-11-22 MED ORDER — NEPRO/CARBSTEADY PO LIQD
237.0000 mL | Freq: Two times a day (BID) | ORAL | Status: DC
  Administered 2015-11-22 – 2015-11-23 (×4): 237 mL via ORAL

## 2015-11-22 MED ORDER — SODIUM CHLORIDE 0.9 % IV SOLN
Freq: Once | INTRAVENOUS | Status: AC
  Administered 2015-11-22: 250 mL via INTRAVENOUS

## 2015-11-22 MED ORDER — POTASSIUM CHLORIDE CRYS ER 20 MEQ PO TBCR
40.0000 meq | EXTENDED_RELEASE_TABLET | Freq: Once | ORAL | Status: AC
  Administered 2015-11-22: 40 meq via ORAL
  Filled 2015-11-22: qty 2

## 2015-11-22 NOTE — Progress Notes (Signed)
[facility] Progress Note    Assessment/ Plan:   1. Acute on Chronic Renal Failure: Suspect this is A on CRF in the setting of volume depletion on ACE with baseline DN. However, given possibly nephrotic range protein and hematuria cannot rule out GN. Last SCr 1.90 in February, so elevated SCr could also represent chronic worsening in the setting of diabetic nephropathy with poorly controlled illnesses. Initial UA with 6-30 RBC per HPF; repeat 11/2 with TNC RBCs and granular casts. CK WNL. Spot urine protein 760 mg/dL. FENa 1.1%, consistent with intrinsic disease. Albumin 1.5 (was 1.9 on admission), which may represent poor nutritional status vs nephrotic syndrome /intrinsic kidney injury. Currently on Demedax 40 daily. Serologies: ANA, kappa/lambda light chains in process. ANCA titers pending but antibodies not elevated. C3, C4,  HIV, hepatitis panel negative. UOP down but incompletely charted. Continue torsemide.  2. HTN: Improved with occasional highs. Increased carvedilol to 12.5 BID 11/2. Continue torsemide daily.  3. Anemia: Hgb as high as 12.5 on 02/11/15 via [place]. Agree with iron studies, which showed severe deficiency. s/p feraheme 11/2, to be given while inpatient. Will start aranesp weekly, as well.  4. Renal cysts: Nonobstructive. MRI showed complex renal cortical mass with mural nodule, concerning for renal cell carcinoma; will need further imaging with IV contrast.  5. Elevated troponin: Concern for NSTEMI. Cardiology following. Agree with abstaining from contrast for cath until patient medically improved.   6. Diabetes: Per primary team. Hgb A1c 10.0, worse from hgb A1c 8.1 07/16/15. Home regimen of tradjenta and novolog 70/30 80 U with breakfast, 72 U with dinner. Improved CBGs on 10 U lantus BID, meal coverage and SSI.  Subjective:   Patient complains of back pain. Says he has been urinating about every 2 hours. He reports that urine continues to be clear. He  would like to get out of bed more.    Objective:   BP (!) 141/65 Comment: recheck  Pulse 79   Temp 97.8 F (36.6 C) (Oral)   Resp 18   Ht 5\' 11"  (1.803 m)   Wt 223 lb 8.7 oz (101.4 kg)   SpO2 97%   BMI 31.18 kg/m   Intake/Output Summary (Last 24 hours) at 11/22/15 0827 Last data filed at 11/22/15 [id number]  Gross per 24 hour  Intake             1280 ml  Output              925 ml  Net              355 ml   Weight change:   Physical Exam: General: Overweight male, sitting up in recliner Cardiac: RRR, S1, S2, no m/r/g Lungs: CTAB, no increased WOB,  MSK: 1+ pitting edema to thighs; mild TTP over L CVA and midline over thoracic spine  Neuro: AOx3, moves all extremities spontaneously  Imaging: US Renal  Result Date: 11/19/2015 CLINICAL DATA:  Acute renal insufficiency EXAM: RENAL / URINARY TRACT ULTRASOUND COMPLETE COMPARISON:  None. FINDINGS: Right Kidney: Length: 12.2 cm. Echogenicity is increased. Renal cortical thickness is normal. No perinephric fluid or hydronephrosis visualized. There is a cystic mass arising from the upper pole of the right kidney measuring 2.5 x 2.8 x 2.2 cm. Within this cystic mass, there is a solid-appearing focus measuring 1.4 x 1.2 x 1.2 cm. There is no sonographically demonstrable calculus or ureterectasis. Left Kidney: Length: 11.6 cm. Echogenicity is increased. Renal cortical thickness is normal. No perinephric  fluid or hydronephrosis visualized. There is a cyst arising from the left kidney measuring 3.0 x 2.6 x 2.8 cm. No sonographically demonstrable calculus or ureterectasis. Bladder: Appears normal for degree of bladder distention. IMPRESSION: Kidneys are echogenic consistent with medical renal disease. No obstructing focus identified on either side. There is a cystic lesion arising from the upper pole of the right kidney which contains a solid focus. This appearance raises concern for potential renal neoplasm with a cystic component. Further evaluation  with pre and post contrast MRI or CT should be considered. MRI is preferred in younger patients (due to lack of ionizing radiation) and for evaluating calcified lesion(s). Given the elevated creatinine, MRI may be a better choice for further assessment if there is no contraindication to MR imaging. There is a simple cyst in the left kidney. These results will be called to the ordering clinician or representative by the Radiologist Assistant, and communication documented in the PACS or zVision Dashboard. Electronically Signed   By: Lowella Grip III M.D.   On: 11/19/2015 14:57     Labs: BMET  Recent Labs Lab 11/16/15 1800 11/17/15 0220 11/18/15 0404 11/19/15 0737 11/20/15 0326 11/21/15 0542 11/22/15 0510  NA 137 138 135 134* 135 138 137  K 3.8 3.5 3.8 3.6 3.6 3.5 3.7  CL 114* 114* 111 109 108 111 108  CO2 16* 17* 16* 18* 20* 20* 21*  GLUCOSE 169* 193* 229* 90 87 31* 177*  BUN 37* 37* 41* 43* 50* 51* 51*  CREATININE 3.14* 3.13* 3.20* 3.17* 3.44* 3.49* 3.60*  CALCIUM 8.3* 8.4* 8.5* 8.4* 8.3* 8.3* 7.9*  PHOS  --  4.9* 5.1* 4.7*  --   --   --    CBC  Recent Labs Lab 11/15/15 1830  11/15/15 2328  11/17/15 0220  11/18/15 0404 11/19/15 0737 11/20/15 0326 11/22/15 0510  WBC 13.7*  --  9.9  --  11.5*  --  11.3* 9.2 8.9 9.5  NEUTROABS 11.8*  --  7.3  --  6.6  --  6.4  --   --   --   HGB 9.0*  < > 8.5*  < > 7.6*  < > 7.7* 7.8* 7.7* 7.3*  HCT 31.1*  < > 26.2*  < > 23.4*  < > 24.0* 24.5* 23.8* 23.1*  MCV 94.8  --  85.3  --  86.7  --  87.0 86.9 86.5 87.5  PLT 399  --  335  --  299  --  337 369 352 422*  < > = values in this interval not displayed.  Medications:    . aspirin EC  81 mg Oral Daily  . carvedilol  12.5 mg Oral BID WC  . darbepoetin (ARANESP) injection - NON-DIALYSIS  100 mcg Subcutaneous Q Fri-1800  . donepezil  10 mg Oral QHS  . ferumoxytol  510 mg Intravenous Weekly  . folic acid  1 mg Oral Daily  . heparin subcutaneous  5,000 Units Subcutaneous Q8H  . insulin  aspart  0-5 Units Subcutaneous QHS  . insulin aspart  0-9 Units Subcutaneous TID WC  . insulin aspart  3 Units Subcutaneous TID WC  . insulin glargine  10 Units Subcutaneous BID  . levothyroxine  25 mcg Oral QAC breakfast  . montelukast  10 mg Oral QHS  . pantoprazole  40 mg Oral Daily  . rosuvastatin  20 mg Oral q1800  . thiamine  100 mg Oral Daily  . ticagrelor  90 mg Oral BID  .  torsemide  40 mg Oral Daily   Olene Floss, MD [place], PGY-2 11/22/2015, 8:27 AM

## 2015-11-22 NOTE — Progress Notes (Addendum)
CCMD notified RN about patient having few runs of SVT. MD informed and per Dr. Ree Kida order, patient received K+ 40MEQ. Will continue to monitor.

## 2015-11-22 NOTE — Progress Notes (Signed)
PROGRESS NOTE    Dillon Thomas  [id number] DOB: 02-Jan-1951 DOA: 11/15/2015 PCP: [name], MD   Chief Complaint  Patient presents with  . Altered Mental Status  . Hyperglycemia    Brief Narrative:  HPI 11/15/2015 by Dr. Lily Kocher Dillon Thomas is a 65 y.o. gentleman with a history of uncontrolled type 2 DM, CKD 3 (baseline creatinine around 2), peripheral neuropathy, prior TIA, GERD, and hypothyroidism who has had progressive gait instability ("shuffling gait" per his significant other), back pain, and speech difficulties (referred to neurology at [facility] in the summer; he has seen speech therapy as an outpatient; last MRI in the [place] system was in 2015) who was last known to be in his baseline state of health around 11:30AM this morning.  Presently, he is obtunded and unable to engage in any type of effective communication.  This history of taken from his significant other, ED documentation, and outside medical records.  The patient's significant other found him down at home around 5PM. The patient was making sounds with his mouth but could not speak.  He was incontinent of urine.  He was staring into space.  She immediately called 911. Assessment & Plan   HHNK -Appears to have resolved  Diabetes mellitus, type II, uncontrolled -Hemoglobin A1c 10 -Continue Lantus, insulin sliding scale, CBG monitoring  Acute on chronic kidney disease, stage III -Baseline creatinine appears to be 1.5-1.9 -Called patient's PCP, creatinine on 03/04/2015 was 1.9 -Upon admission, creatinine was 3.48. Currently 3.6 -Possibly due to dehydration from HHNK versus ATN vs GN -Renal US: no obstructing focus.   -Nephrology consulted and appreciated- pending ANA, ANCA -C3 and C4 WNL, hepatitis panel unremarkable, HIV nonreactive -Nephrology restarted lasix and transitioned to torsemide 40mg  BID  -Possibly decrease torsemide to 20mg  daily   Right Kidney Cystic Lesion/pancreatic lesion -noted  on renal US -Spoke with Dr. Posey Pronto, nephrology.  Although MRI with contrast is recommended, given patient's current creatinine, will order MR without contrast -MRI abd showed ?RCC and suspicion for lymphoproliferative disorder, cystic pancreatic head lesion. Needs MRI with and without contrast and PET-CT to further examine. -Will speak to urology  Normocytic Anemia -Hemoglobin currently 7.3, appears to have been higher upon admission. -Drop may be secondary to dilutional component -Anemia panel: iron 10 -feraheme ordered -Nephrology will also start weekly aranesp -Per family, patient has been having loose stools for the past month and has been very dark/black in color. -Patient supposedly did have a colonoscopy a few years ago in [place]. States he has chronic anemia. -Continue to monitor CBC -Will transfuse 1uPRBC  Hypothyroidism -TSH 2.954, free T4 1 0.02 -Called patient's PCP, takes synthroid 45mcg daily  -Continue synthroid  Elevated Troponin/NSTEMI -Cardiology consulted and appreciated -Echocardiogram: EF of 50-55%, left ventricular diastolic function parameters were normal. -Plan for cardiac cath in 2-4 weeks  Uncontrolled Hypertension -Continue Coreg and hydralazine as needed  Acute Encephalopathy -Likely multifactorial -Appears to improving -EEG showed no deformity activity -MRI brain without contrast: No evidence of acute infarction. Extensive extra-axial mass lesion along the floor of the anterior cranial fossa/planum sphenoidale region most consistent with meningioma  Possible meningioma -Seen on MRI brain without contrast. Patient will need contrast for further evaluation. However given patient's renal function, will likely be done as an outpatient.  Possible seizure/history of CVA -Specific cardiac home, patient is back to his baseline speech. He does have some gait disturbances. -EEG conducted, unremarkable -Inpatient rehabilitation consulted and  appreciated  GERD -Continue PPI  Hypomagnesemia/hypokalemia/hyponatremia -Resolved, continue to monitor BMP  DVT Prophylaxis  heparin  Code Status: Full  Family Communication: None at bedside.   Disposition Plan: Admitted. Continue to work up anemia, AKI. Possible discharge to inpatient rehab on 11/6  Consultants PCCM Cardiology Neurology Nephrology Inpatient rehab Urology  Procedures  Echocardiogram EEG  Antibiotics   Anti-infectives    None      Subjective:   Dillon Thomas seen and examined today.  Patient feels better this morning.   Denies chest pain, shortness of breath, abdominal pain, nausea or vomiting.     Objective:   Vitals:   11/21/15 1338 11/21/15 2241 11/22/15 0518 11/22/15 0609  BP: 139/74 (!) 152/65 (!) 178/73 (!) 141/65  Pulse: 83 86 80 79  Resp: 19 20 18    Temp: 97.5 F (36.4 C) 98.2 F (36.8 C) 97.8 F (36.6 C)   TempSrc: Oral Oral Oral   SpO2: 100% 97% 97%   Weight:      Height:        Intake/Output Summary (Last 24 hours) at 11/22/15 1143 Last data filed at 11/22/15 1143  Gross per 24 hour  Intake              780 ml  Output              725 ml  Net               55 ml   Filed Weights   11/15/15 1827 11/17/15 1737 11/18/15 2307  Weight: 99.8 kg (220 lb) 101.8 kg (224 lb 6.9 oz) 101.4 kg (223 lb 8.7 oz)    Exam  General: Well developed, chronically ill appearing, NAD, appears stated age  HEENT: NCAT, mucous membranes moist.   Cardiovascular: S1 S2 auscultated, RRR, no murmurs  Respiratory: Clear to auscultation bilaterally with equal chest rise  Abdomen: Soft, obese, mildly distended, mild TTP lower abdomen, + bowel sounds  Extremities: warm dry without cyanosis clubbing. +LE Edema  Neuro: AAOx3, nonfocal  Psych: Normal affect and demeanor, pleasant   Data Reviewed: I have personally reviewed following labs and imaging studies  CBC:  Recent Labs Lab 11/15/15 1830  11/15/15 2328  11/17/15 0220  11/17/15 1653 11/18/15 0404 11/19/15 0737 11/20/15 0326 11/22/15 0510  WBC 13.7*  --  9.9  --  11.5*  --  11.3* 9.2 8.9 9.5  NEUTROABS 11.8*  --  7.3  --  6.6  --  6.4  --   --   --   HGB 9.0*  < > 8.5*  < > 7.6* 7.9* 7.7* 7.8* 7.7* 7.3*  HCT 31.1*  < > 26.2*  < > 23.4* 25.3* 24.0* 24.5* 23.8* 23.1*  MCV 94.8  --  85.3  --  86.7  --  87.0 86.9 86.5 87.5  PLT 399  --  335  --  299  --  337 369 352 422*  < > = values in this interval not displayed. Basic Metabolic Panel:  Recent Labs Lab 11/17/15 0220 11/18/15 0404 11/19/15 0737 11/20/15 0326 11/21/15 0542 11/22/15 0510  NA 138 135 134* 135 138 137  K 3.5 3.8 3.6 3.6 3.5 3.7  CL 114* 111 109 108 111 108  CO2 17* 16* 18* 20* 20* 21*  GLUCOSE 193* 229* 90 87 31* 177*  BUN 37* 41* 43* 50* 51* 51*  CREATININE 3.13* 3.20* 3.17* 3.44* 3.49* 3.60*  CALCIUM 8.4* 8.5* 8.4* 8.3* 8.3* 7.9*  MG 1.4* 1.8  --   --   --   --  PHOS 4.9* 5.1* 4.7*  --   --   --    GFR: Estimated Creatinine Clearance: 24.5 mL/min (by C-G formula based on SCr of 3.6 mg/dL (H)). Liver Function Tests:  Recent Labs Lab 11/15/15 1830 11/15/15 2328 11/17/15 0220 11/18/15 0404 11/19/15 0737  AST 23 29  --   --   --   ALT 14* 14*  --   --   --   ALKPHOS 94 82  --   --   --   BILITOT 0.6 0.3  --   --   --   PROT 7.2 6.9  --   --   --   ALBUMIN 1.9* 1.8* 1.5* 1.6* 1.5*   No results for input(s): LIPASE, AMYLASE in the last 168 hours.  Recent Labs Lab 11/15/15 1830  AMMONIA 19   Coagulation Profile:  Recent Labs Lab 11/15/15 1830  INR 1.36   Cardiac Enzymes:  Recent Labs Lab 11/15/15 2156 11/16/15 0257 11/16/15 0630 11/16/15 0914 11/20/15 0326  CKTOTAL  --   --   --   --  55  TROPONINI 0.89* 5.34* 7.46* 7.46*  --    BNP (last 3 results) No results for input(s): PROBNP in the last 8760 hours. HbA1C: No results for input(s): HGBA1C in the last 72 hours. CBG:  Recent Labs Lab 11/21/15 0927 11/21/15 1152 11/21/15 1711  11/21/15 2239 11/22/15 0809  GLUCAP 115* 158* 236* 184* 155*   Lipid Profile: No results for input(s): CHOL, HDL, LDLCALC, TRIG, CHOLHDL, LDLDIRECT in the last 72 hours. Thyroid Function Tests: No results for input(s): TSH, T4TOTAL, FREET4, T3FREE, THYROIDAB in the last 72 hours. Anemia Panel: No results for input(s): VITAMINB12, FOLATE, FERRITIN, TIBC, IRON, RETICCTPCT in the last 72 hours. Urine analysis:    Component Value Date/Time   COLORURINE ORANGE (A) 11/19/2015 1340   APPEARANCEUR CLOUDY (A) 11/19/2015 1340   APPEARANCEUR Clear 01/09/2013 1858   LABSPEC 1.017 11/19/2015 1340   LABSPEC 1.015 01/09/2013 1858   PHURINE 5.5 11/19/2015 1340   GLUCOSEU 250 (A) 11/19/2015 1340   GLUCOSEU >=500 01/09/2013 1858   HGBUR LARGE (A) 11/19/2015 1340   BILIRUBINUR NEGATIVE 11/19/2015 1340   BILIRUBINUR Negative 01/09/2013 1858   KETONESUR NEGATIVE 11/19/2015 1340   PROTEINUR >300 (A) 11/19/2015 1340   NITRITE NEGATIVE 11/19/2015 1340   LEUKOCYTESUR NEGATIVE 11/19/2015 1340   LEUKOCYTESUR Negative 01/09/2013 1858   Sepsis Labs: @LABRCNTIP (procalcitonin:4,lacticidven:4)  ) Recent Results (from the past 240 hour(s))  MRSA PCR Screening     Status: None   Collection Time: 11/16/15  4:45 AM  Result Value Ref Range Status   MRSA by PCR NEGATIVE NEGATIVE Final    Comment:        The GeneXpert MRSA Assay (FDA approved for NASAL specimens only), is one component of a comprehensive MRSA colonization surveillance program. It is not intended to diagnose MRSA infection nor to guide or monitor treatment for MRSA infections.   Culture, Urine     Status: None   Collection Time: 11/16/15 11:25 AM  Result Value Ref Range Status   Specimen Description URINE, CATHETERIZED  Final   Special Requests Normal  Final   Culture NO GROWTH  Final   Report Status 11/17/2015 FINAL  Final  Culture, blood (routine x 2)     Status: None   Collection Time: 11/16/15 12:15 PM  Result Value Ref Range  Status   Specimen Description BLOOD RIGHT HAND  Final   Special Requests IN PEDIATRIC BOTTLE 3ML  Final   Culture NO GROWTH 5 DAYS  Final   Report Status 11/21/2015 FINAL  Final  Culture, blood (routine x 2)     Status: None   Collection Time: 11/16/15 12:20 PM  Result Value Ref Range Status   Specimen Description BLOOD LEFT HAND  Final   Special Requests IN PEDIATRIC BOTTLE 3ML  Final   Culture NO GROWTH 5 DAYS  Final   Report Status 11/21/2015 FINAL  Final      Radiology Studies: Mr Abdomen Wo Contrast  Result Date: 11/21/2015 CLINICAL DATA:  65 year old male inpatient with renal failure and indeterminate cystic right renal mass on ultrasound, presenting for further characterization. EXAM: MRI ABDOMEN WITHOUT CONTRAST TECHNIQUE: Multiplanar multisequence MR imaging was performed without the administration of intravenous contrast. COMPARISON:  11/19/2015 renal sonogram and 12/15/2014 unenhanced CT abdomen/ pelvis. FINDINGS: Study is significantly motion degraded. Lower chest: Moderate left pleural effusion. Hepatobiliary: Normal liver size and configuration. There is prominent iron deposition throughout the liver. No liver mass. Cholecystectomy. Bile ducts are within expected post cholecystectomy limits with mild central intrahepatic biliary ductal dilatation and common bile duct diameter 8 mm. No choledocholithiasis. Pancreas: No pancreatic duct dilation. There is a unilocular lobulated 2.0 x 1.1 x 1.1 cm cystic pancreatic lesion in the pancreatic head (series 7/ image 33). No additional pancreatic lesions. No evidence of pancreas divisum. Spleen: Normal size. No mass. There is prominent iron deposition throughout the spleen. Adrenals/Urinary Tract: No discrete adrenal nodules. No hydronephrosis. There is a 3.2 x 2.7 x 2.8 cm renal cortical mass in the upper right kidney (series 6/ image 66), which demonstrates heterogeneous T1 hyperintensity and mixed T2 signal intensity, with apparent 1.8 cm  posterior mural nodule. There are similar appearing partially exophytic complex renal cysts in both kidneys, measuring 3.4 x 3.1 cm in the posterior lower right kidney (series 7/image 51) and 2.8 x 2.4 cm in the lateral interpolar left kidney (series 7/image 43), both demonstrating T1 hyperintensity and fluid debris levels on T2 imaging. Additional subcentimeter T2 hyperintense simple appearing renal cysts are present in both kidneys . Stomach/Bowel: Grossly normal stomach. Visualized small and large bowel is normal caliber, with no bowel wall thickening. Vascular/Lymphatic: There is apparent contrast material within the blood pool, possibly from recent administration of contrast given the provided history of renal failure (IV contrast was not administered on the scan). Atherosclerotic nonaneurysmal abdominal aorta. Retrocaval adenopathy measuring up to 1.8 cm (series 7/ image 41), increased from 1.4 cm on 12/15/2014. Left para-aortic adenopathy measuring up to 02.0 cm (series 7/image 48), increased from 1.7 cm on 12/15/2014. Other: No abdominal ascites or focal fluid collection. Musculoskeletal: No aggressive appearing focal osseous lesions. IMPRESSION: 1. Limited motion degraded noncontrast MRI. 2. Indeterminate complex hemorrhagic/proteinaceous 3.2 cm renal cortical mass in the upper right kidney with apparent 1.8 cm mural nodule, cannot exclude renal cell carcinoma. Renal mass protocol MRI or CT of the abdomen without and with IV contrast is necessary for further characterization, when clinically feasible. 3. Additional indeterminate hemorrhagic/proteinaceous renal cortical masses in the posterior lower right kidney and lateral interpolar left kidney. 4. Retroperitoneal lymphadenopathy has increased mildly since 12/15/2014, and is suspicious for a lymphoproliferative disorder. Recommend correlation with clinical history and laboratory evaluation for possible lymphoproliferative disorder. PET-CT may be useful for  further characterization and potential biopsy planning. 5. Unilocular 2.0 cm cystic pancreatic lesion in the pancreatic head without main pancreatic duct dilation, incompletely characterized on this noncontrast study. Pancreas protocol MRI abdomen without and with  IV contrast is recommended in 6 months. This recommendation follows ACR consensus guidelines: Management of Incidental Pancreatic Cysts: A White Paper of the ACR Incidental Findings Committee. [place] [id number]. 6. Moderate left pleural effusion. 7. Prominent iron deposition throughout the liver and spleen, probably transfusion related. Electronically Signed   By: Ilona Sorrel M.D.   On: 11/21/2015 13:01     Scheduled Meds: . aspirin EC  81 mg Oral Daily  . carvedilol  12.5 mg Oral BID WC  . darbepoetin (ARANESP) injection - NON-DIALYSIS  100 mcg Subcutaneous Q Fri-1800  . donepezil  10 mg Oral QHS  . feeding supplement (NEPRO CARB STEADY)  237 mL Oral BID BM  . ferumoxytol  510 mg Intravenous Weekly  . folic acid  1 mg Oral Daily  . heparin subcutaneous  5,000 Units Subcutaneous Q8H  . insulin aspart  0-5 Units Subcutaneous QHS  . insulin aspart  0-9 Units Subcutaneous TID WC  . insulin aspart  3 Units Subcutaneous TID WC  . insulin glargine  10 Units Subcutaneous BID  . levothyroxine  25 mcg Oral QAC breakfast  . montelukast  10 mg Oral QHS  . pantoprazole  40 mg Oral Daily  . rosuvastatin  20 mg Oral q1800  . thiamine  100 mg Oral Daily  . ticagrelor  90 mg Oral BID  . [START ON 11/23/2015] torsemide  20 mg Oral Daily   Continuous Infusions:   LOS: 7 days   Time Spent in minutes   30 minutes  Luke Rigsbee D.O. on 11/22/2015 at 11:43 AM  Between 7am to 7pm - Pager - [phone]  After 7pm go to [email or web address] - password [email or web address]  And look for the night coverage person covering for me after hours  [facility] Office  [phone]

## 2015-11-23 ENCOUNTER — Inpatient Hospital Stay (HOSPITAL_COMMUNITY)
Admission: RE | Admit: 2015-11-23 | Discharge: 2015-12-04 | DRG: 947 | Disposition: A | Discharge status: Skilled Nursing Facility | Payer: MEDICARE | Source: Intra-hospital | Attending: Physical Medicine & Rehabilitation | Admitting: Physical Medicine & Rehabilitation

## 2015-11-23 DIAGNOSIS — E8809 Other disorders of plasma-protein metabolism, not elsewhere classified: Secondary | ICD-10-CM | POA: Diagnosis present

## 2015-11-23 DIAGNOSIS — E663 Overweight: Secondary | ICD-10-CM | POA: Diagnosis present

## 2015-11-23 DIAGNOSIS — E1122 Type 2 diabetes mellitus with diabetic chronic kidney disease: Secondary | ICD-10-CM | POA: Diagnosis present

## 2015-11-23 DIAGNOSIS — E872 Acidosis: Secondary | ICD-10-CM | POA: Diagnosis present

## 2015-11-23 DIAGNOSIS — E039 Hypothyroidism, unspecified: Secondary | ICD-10-CM | POA: Diagnosis present

## 2015-11-23 DIAGNOSIS — G479 Sleep disorder, unspecified: Secondary | ICD-10-CM

## 2015-11-23 DIAGNOSIS — R079 Chest pain, unspecified: Secondary | ICD-10-CM

## 2015-11-23 DIAGNOSIS — M533 Sacrococcygeal disorders, not elsewhere classified: Secondary | ICD-10-CM | POA: Diagnosis present

## 2015-11-23 DIAGNOSIS — Z9119 Patient's noncompliance with other medical treatment and regimen: Secondary | ICD-10-CM

## 2015-11-23 DIAGNOSIS — D72829 Elevated white blood cell count, unspecified: Secondary | ICD-10-CM | POA: Diagnosis present

## 2015-11-23 DIAGNOSIS — S2243XA Multiple fractures of ribs, bilateral, initial encounter for closed fracture: Secondary | ICD-10-CM | POA: Diagnosis present

## 2015-11-23 DIAGNOSIS — E8779 Other fluid overload: Secondary | ICD-10-CM | POA: Diagnosis not present

## 2015-11-23 DIAGNOSIS — E46 Unspecified protein-calorie malnutrition: Secondary | ICD-10-CM

## 2015-11-23 DIAGNOSIS — G9340 Encephalopathy, unspecified: Secondary | ICD-10-CM | POA: Diagnosis present

## 2015-11-23 DIAGNOSIS — F0390 Unspecified dementia without behavioral disturbance: Secondary | ICD-10-CM | POA: Diagnosis present

## 2015-11-23 DIAGNOSIS — N2889 Other specified disorders of kidney and ureter: Secondary | ICD-10-CM

## 2015-11-23 DIAGNOSIS — Z794 Long term (current) use of insulin: Secondary | ICD-10-CM

## 2015-11-23 DIAGNOSIS — I10 Essential (primary) hypertension: Secondary | ICD-10-CM | POA: Diagnosis not present

## 2015-11-23 DIAGNOSIS — Z23 Encounter for immunization: Secondary | ICD-10-CM | POA: Diagnosis not present

## 2015-11-23 DIAGNOSIS — M47812 Spondylosis without myelopathy or radiculopathy, cervical region: Secondary | ICD-10-CM | POA: Diagnosis present

## 2015-11-23 DIAGNOSIS — S32592A Other specified fracture of left pubis, initial encounter for closed fracture: Secondary | ICD-10-CM | POA: Diagnosis present

## 2015-11-23 DIAGNOSIS — I150 Renovascular hypertension: Secondary | ICD-10-CM

## 2015-11-23 DIAGNOSIS — I214 Non-ST elevation (NSTEMI) myocardial infarction: Secondary | ICD-10-CM | POA: Diagnosis present

## 2015-11-23 DIAGNOSIS — N183 Chronic kidney disease, stage 3 unspecified: Secondary | ICD-10-CM

## 2015-11-23 DIAGNOSIS — L89152 Pressure ulcer of sacral region, stage 2: Secondary | ICD-10-CM | POA: Diagnosis not present

## 2015-11-23 DIAGNOSIS — T148XXA Other injury of unspecified body region, initial encounter: Secondary | ICD-10-CM | POA: Diagnosis not present

## 2015-11-23 DIAGNOSIS — D649 Anemia, unspecified: Secondary | ICD-10-CM | POA: Diagnosis present

## 2015-11-23 DIAGNOSIS — E1165 Type 2 diabetes mellitus with hyperglycemia: Secondary | ICD-10-CM | POA: Diagnosis present

## 2015-11-23 DIAGNOSIS — E1142 Type 2 diabetes mellitus with diabetic polyneuropathy: Secondary | ICD-10-CM | POA: Diagnosis present

## 2015-11-23 DIAGNOSIS — R0781 Pleurodynia: Secondary | ICD-10-CM

## 2015-11-23 DIAGNOSIS — Z79899 Other long term (current) drug therapy: Secondary | ICD-10-CM

## 2015-11-23 DIAGNOSIS — R5381 Other malaise: Secondary | ICD-10-CM | POA: Diagnosis present

## 2015-11-23 DIAGNOSIS — E785 Hyperlipidemia, unspecified: Secondary | ICD-10-CM | POA: Diagnosis present

## 2015-11-23 DIAGNOSIS — D638 Anemia in other chronic diseases classified elsewhere: Secondary | ICD-10-CM

## 2015-11-23 DIAGNOSIS — I209 Angina pectoris, unspecified: Secondary | ICD-10-CM | POA: Diagnosis present

## 2015-11-23 DIAGNOSIS — E876 Hypokalemia: Secondary | ICD-10-CM | POA: Diagnosis present

## 2015-11-23 DIAGNOSIS — D62 Acute posthemorrhagic anemia: Secondary | ICD-10-CM | POA: Diagnosis not present

## 2015-11-23 DIAGNOSIS — I129 Hypertensive chronic kidney disease with stage 1 through stage 4 chronic kidney disease, or unspecified chronic kidney disease: Secondary | ICD-10-CM | POA: Diagnosis present

## 2015-11-23 DIAGNOSIS — N281 Cyst of kidney, acquired: Secondary | ICD-10-CM | POA: Diagnosis present

## 2015-11-23 DIAGNOSIS — R768 Other specified abnormal immunological findings in serum: Secondary | ICD-10-CM

## 2015-11-23 DIAGNOSIS — J90 Pleural effusion, not elsewhere classified: Secondary | ICD-10-CM | POA: Diagnosis present

## 2015-11-23 DIAGNOSIS — Z8673 Personal history of transient ischemic attack (TIA), and cerebral infarction without residual deficits: Secondary | ICD-10-CM

## 2015-11-23 DIAGNOSIS — S51012S Laceration without foreign body of left elbow, sequela: Secondary | ICD-10-CM

## 2015-11-23 DIAGNOSIS — E8770 Fluid overload, unspecified: Secondary | ICD-10-CM | POA: Diagnosis present

## 2015-11-23 DIAGNOSIS — Z6826 Body mass index (BMI) 26.0-26.9, adult: Secondary | ICD-10-CM

## 2015-11-23 LAB — CBC WITH DIFFERENTIAL/PLATELET
Basophils Absolute: 0 10*3/uL (ref 0.0–0.1)
Basophils Relative: 0 %
Eosinophils Absolute: 0.1 10*3/uL (ref 0.0–0.7)
Eosinophils Relative: 1 %
HCT: 22.4 % — ABNORMAL LOW (ref 39.0–52.0)
Hemoglobin: 7.3 g/dL — ABNORMAL LOW (ref 13.0–17.0)
Lymphocytes Relative: 25 %
Lymphs Abs: 2.6 10*3/uL (ref 0.7–4.0)
MCH: 28.9 pg (ref 26.0–34.0)
MCHC: 32.6 g/dL (ref 30.0–36.0)
MCV: 88.5 fL (ref 78.0–100.0)
Monocytes Absolute: 2.6 10*3/uL — ABNORMAL HIGH (ref 0.1–1.0)
Monocytes Relative: 25 %
Neutro Abs: 5.1 10*3/uL (ref 1.7–7.7)
Neutrophils Relative %: 49 %
Platelets: 405 10*3/uL — ABNORMAL HIGH (ref 150–400)
RBC: 2.53 MIL/uL — ABNORMAL LOW (ref 4.22–5.81)
RDW: 15.1 % (ref 11.5–15.5)
WBC: 10.4 10*3/uL (ref 4.0–10.5)

## 2015-11-23 LAB — BASIC METABOLIC PANEL
ANION GAP: 10 (ref 5–15)
BUN: 56 mg/dL — AB (ref 6–20)
CHLORIDE: 109 mmol/L (ref 101–111)
CO2: 19 mmol/L — ABNORMAL LOW (ref 22–32)
Calcium: 8.2 mg/dL — ABNORMAL LOW (ref 8.9–10.3)
Creatinine, Ser: 3.63 mg/dL — ABNORMAL HIGH (ref 0.61–1.24)
GFR calc Af Amer: 19 mL/min — ABNORMAL LOW (ref >60)
GFR, EST NON AFRICAN AMERICAN: 16 mL/min — AB (ref >60)
Glucose, Bld: 118 mg/dL — ABNORMAL HIGH (ref 65–99)
POTASSIUM: 3.9 mmol/L (ref 3.5–5.1)
SODIUM: 138 mmol/L (ref 135–145)

## 2015-11-23 LAB — PROTEIN ELECTROPHORESIS, SERUM
A/G RATIO SPE: 0.5 — AB (ref 0.7–1.7)
ALPHA-2-GLOBULIN: 0.8 g/dL (ref 0.4–1.0)
Albumin ELP: 1.9 g/dL — ABNORMAL LOW (ref 2.9–4.4)
Alpha-1-Globulin: 0.4 g/dL (ref 0.0–0.4)
Beta Globulin: 0.7 g/dL (ref 0.7–1.3)
GLOBULIN, TOTAL: 3.9 g/dL (ref 2.2–3.9)
Gamma Globulin: 2 g/dL — ABNORMAL HIGH (ref 0.4–1.8)
M-SPIKE, %: 1.4 g/dL — AB
TOTAL PROTEIN ELP: 5.8 g/dL — AB (ref 6.0–8.5)

## 2015-11-23 LAB — GLUCOSE, CAPILLARY
GLUCOSE-CAPILLARY: 124 mg/dL — AB (ref 65–99)
Glucose-Capillary: 277 mg/dL — ABNORMAL HIGH (ref 65–99)
Glucose-Capillary: 221 mg/dL — ABNORMAL HIGH (ref 65–99)

## 2015-11-23 LAB — COMPREHENSIVE METABOLIC PANEL
ALT: 13 U/L — ABNORMAL LOW (ref 17–63)
AST: 16 U/L (ref 15–41)
Albumin: 1.6 g/dL — ABNORMAL LOW (ref 3.5–5.0)
Alkaline Phosphatase: 76 U/L (ref 38–126)
Anion gap: 8 (ref 5–15)
BUN: 61 mg/dL — ABNORMAL HIGH (ref 6–20)
CO2: 19 mmol/L — ABNORMAL LOW (ref 22–32)
Calcium: 7.9 mg/dL — ABNORMAL LOW (ref 8.9–10.3)
Chloride: 108 mmol/L (ref 101–111)
Creatinine, Ser: 3.6 mg/dL — ABNORMAL HIGH (ref 0.61–1.24)
GFR calc Af Amer: 19 mL/min — ABNORMAL LOW (ref >60)
GFR calc non Af Amer: 16 mL/min — ABNORMAL LOW (ref >60)
Glucose, Bld: 258 mg/dL — ABNORMAL HIGH (ref 65–99)
Potassium: 3.7 mmol/L (ref 3.5–5.1)
Sodium: 135 mmol/L (ref 135–145)
Total Bilirubin: 0.4 mg/dL (ref 0.3–1.2)
Total Protein: 5.7 g/dL — ABNORMAL LOW (ref 6.5–8.1)

## 2015-11-23 LAB — KAPPA/LAMBDA LIGHT CHAINS
KAPPA FREE LGHT CHN: 1061.4 mg/L — AB (ref 3.3–19.4)
KAPPA, LAMDA LIGHT CHAIN RATIO: 9.98 — AB (ref 0.26–1.65)
LAMDA FREE LIGHT CHAINS: 106.3 mg/L — AB (ref 5.7–26.3)

## 2015-11-23 LAB — CBC
HCT: 25.4 % — ABNORMAL LOW (ref 39.0–52.0)
HEMOGLOBIN: 8 g/dL — AB (ref 13.0–17.0)
MCH: 28.1 pg (ref 26.0–34.0)
MCHC: 31.5 g/dL (ref 30.0–36.0)
MCV: 89.1 fL (ref 78.0–100.0)
PLATELETS: 445 10*3/uL — AB (ref 150–400)
RBC: 2.85 MIL/uL — AB (ref 4.22–5.81)
RDW: 14.7 % (ref 11.5–15.5)
WBC: 10.8 10*3/uL — AB (ref 4.0–10.5)

## 2015-11-23 LAB — ANCA TITERS: Atypical P-ANCA titer: <1:20 {titer}

## 2015-11-23 LAB — ANTINUCLEAR ANTIBODIES, IFA: ANA Ab, IFA: NEGATIVE

## 2015-11-23 LAB — MAGNESIUM: MAGNESIUM: 1.6 mg/dL — AB (ref 1.7–2.4)

## 2015-11-23 MED ORDER — INSULIN ASPART 100 UNIT/ML ~~LOC~~ SOLN
4.0000 [IU] | Freq: Three times a day (TID) | SUBCUTANEOUS | Status: DC
  Administered 2015-11-24 – 2015-11-29 (×17): 4 [IU] via SUBCUTANEOUS

## 2015-11-23 MED ORDER — INSULIN ASPART 100 UNIT/ML ~~LOC~~ SOLN
0.0000 [IU] | Freq: Every day | SUBCUTANEOUS | Status: DC
  Administered 2015-11-23: 2 [IU] via SUBCUTANEOUS
  Administered 2015-11-25: 4 [IU] via SUBCUTANEOUS
  Administered 2015-11-26: 2 [IU] via SUBCUTANEOUS

## 2015-11-23 MED ORDER — HEPARIN SODIUM (PORCINE) 5000 UNIT/ML IJ SOLN
5000.0000 [IU] | Freq: Three times a day (TID) | INTRAMUSCULAR | Status: DC
  Administered 2015-11-23: 5000 [IU] via SUBCUTANEOUS
  Filled 2015-11-23 (×2): qty 1

## 2015-11-23 MED ORDER — NEPRO/CARBSTEADY PO LIQD: 237.0000 mL | Freq: Two times a day (BID) | ORAL | 0 refills | Status: DC

## 2015-11-23 MED ORDER — FOLIC ACID 1 MG PO TABS
1.0000 mg | ORAL_TABLET | Freq: Every day | ORAL | Status: DC
  Administered 2015-11-24 – 2015-12-04 (×11): 1 mg via ORAL
  Filled 2015-11-23 (×11): qty 1

## 2015-11-23 MED ORDER — FERUMOXYTOL INJECTION 510 MG/17 ML
510.0000 mg | Freq: Once | INTRAVENOUS | Status: AC
  Administered 2015-11-23: 510 mg via INTRAVENOUS
  Filled 2015-11-23 (×2): qty 17

## 2015-11-23 MED ORDER — INSULIN ASPART 100 UNIT/ML ~~LOC~~ SOLN
4.0000 [IU] | Freq: Three times a day (TID) | SUBCUTANEOUS | Status: DC
  Administered 2015-11-23: 4 [IU] via SUBCUTANEOUS

## 2015-11-23 MED ORDER — LEVOTHYROXINE SODIUM 25 MCG PO TABS
25.0000 ug | ORAL_TABLET | Freq: Every day | ORAL | Status: DC
  Administered 2015-11-24 – 2015-12-04 (×11): 25 ug via ORAL
  Filled 2015-11-23 (×11): qty 1

## 2015-11-23 MED ORDER — HEPARIN SODIUM (PORCINE) 5000 UNIT/ML IJ SOLN: 5000.0000 [IU] | Freq: Three times a day (TID) | INTRAMUSCULAR | Status: DC

## 2015-11-23 MED ORDER — TICAGRELOR 90 MG PO TABS
90.0000 mg | ORAL_TABLET | Freq: Two times a day (BID) | ORAL | Status: DC
  Administered 2015-11-23 – 2015-12-04 (×22): 90 mg via ORAL
  Filled 2015-11-23 (×22): qty 1

## 2015-11-23 MED ORDER — DARBEPOETIN ALFA 100 MCG/0.5ML IJ SOSY
100.0000 ug | PREFILLED_SYRINGE | INTRAMUSCULAR | Status: DC
  Administered 2015-11-27: 100 ug via SUBCUTANEOUS
  Filled 2015-11-23 (×2): qty 0.5

## 2015-11-23 MED ORDER — INSULIN GLARGINE 100 UNIT/ML ~~LOC~~ SOLN
10.0000 [IU] | Freq: Two times a day (BID) | SUBCUTANEOUS | Status: DC
  Administered 2015-11-23 – 2015-11-26 (×7): 10 [IU] via SUBCUTANEOUS
  Filled 2015-11-23 (×9): qty 0.1

## 2015-11-23 MED ORDER — BISACODYL 10 MG RE SUPP: 10.0000 mg | Freq: Every day | RECTAL | Status: DC | PRN

## 2015-11-23 MED ORDER — FOLIC ACID 1 MG PO TABS: 1.0000 mg | ORAL_TABLET | Freq: Every day | ORAL | Status: AC

## 2015-11-23 MED ORDER — NEPRO/CARBSTEADY PO LIQD
237.0000 mL | Freq: Two times a day (BID) | ORAL | Status: DC
  Administered 2015-11-24 – 2015-11-29 (×6): 237 mL via ORAL

## 2015-11-23 MED ORDER — BISACODYL 10 MG RE SUPP: 10.0000 mg | Freq: Every day | RECTAL | 0 refills | Status: DC | PRN

## 2015-11-23 MED ORDER — DONEPEZIL HCL 10 MG PO TABS
10.0000 mg | ORAL_TABLET | Freq: Every day | ORAL | Status: DC
  Administered 2015-11-23 – 2015-11-28 (×6): 10 mg via ORAL
  Filled 2015-11-23 (×6): qty 1

## 2015-11-23 MED ORDER — DARBEPOETIN ALFA 100 MCG/0.5ML IJ SOSY
100.0000 ug | PREFILLED_SYRINGE | INTRAMUSCULAR | Status: DC
Start: 2015-11-27 — End: 2015-12-07

## 2015-11-23 MED ORDER — TORSEMIDE 20 MG PO TABS: 20.0000 mg | ORAL_TABLET | Freq: Every day | ORAL | Status: DC

## 2015-11-23 MED ORDER — THIAMINE HCL 100 MG PO TABS: 100.0000 mg | ORAL_TABLET | Freq: Every day | ORAL | Status: DC

## 2015-11-23 MED ORDER — STROKE: EARLY STAGES OF RECOVERY BOOK
Freq: Once | Status: AC
  Administered 2015-11-23: 17:00:00
  Filled 2015-11-23: qty 1

## 2015-11-23 MED ORDER — ONDANSETRON HCL 4 MG/2ML IJ SOLN: 4.0000 mg | Freq: Four times a day (QID) | INTRAMUSCULAR | Status: DC | PRN

## 2015-11-23 MED ORDER — INSULIN ASPART 100 UNIT/ML ~~LOC~~ SOLN: 0.0000 [IU] | Freq: Three times a day (TID) | SUBCUTANEOUS | 11 refills | Status: DC

## 2015-11-23 MED ORDER — TORSEMIDE 20 MG PO TABS
20.0000 mg | ORAL_TABLET | Freq: Every day | ORAL | Status: DC
  Administered 2015-11-24 – 2015-11-25 (×2): 20 mg via ORAL
  Filled 2015-11-23 (×2): qty 1

## 2015-11-23 MED ORDER — CARVEDILOL 12.5 MG PO TABS
12.5000 mg | ORAL_TABLET | Freq: Two times a day (BID) | ORAL | Status: DC
  Administered 2015-11-24 – 2015-11-25 (×3): 12.5 mg via ORAL
  Filled 2015-11-23 (×3): qty 1

## 2015-11-23 MED ORDER — VITAMIN B-1 100 MG PO TABS
100.0000 mg | ORAL_TABLET | Freq: Every day | ORAL | Status: DC
  Administered 2015-11-24 – 2015-12-04 (×11): 100 mg via ORAL
  Filled 2015-11-23 (×11): qty 1

## 2015-11-23 MED ORDER — ROSUVASTATIN CALCIUM 20 MG PO TABS
20.0000 mg | ORAL_TABLET | Freq: Every day | ORAL | Status: DC
  Administered 2015-11-24 – 2015-12-03 (×10): 20 mg via ORAL
  Filled 2015-11-23: qty 1
  Filled 2015-11-23 (×2): qty 2
  Filled 2015-11-23: qty 1
  Filled 2015-11-23: qty 2
  Filled 2015-11-23: qty 1
  Filled 2015-11-23: qty 2
  Filled 2015-11-23: qty 4
  Filled 2015-11-23 (×2): qty 1

## 2015-11-23 MED ORDER — ASPIRIN 81 MG PO TBEC: 81.0000 mg | DELAYED_RELEASE_TABLET | Freq: Every day | ORAL | Status: AC

## 2015-11-23 MED ORDER — PANTOPRAZOLE SODIUM 40 MG PO TBEC
40.0000 mg | DELAYED_RELEASE_TABLET | Freq: Every day | ORAL | Status: DC
  Administered 2015-11-24 – 2015-12-04 (×11): 40 mg via ORAL
  Filled 2015-11-23 (×12): qty 1

## 2015-11-23 MED ORDER — INSULIN ASPART 100 UNIT/ML ~~LOC~~ SOLN: 4.0000 [IU] | Freq: Three times a day (TID) | SUBCUTANEOUS | 11 refills | Status: DC

## 2015-11-23 MED ORDER — MONTELUKAST SODIUM 10 MG PO TABS
10.0000 mg | ORAL_TABLET | Freq: Every day | ORAL | Status: DC
  Administered 2015-11-23 – 2015-11-28 (×6): 10 mg via ORAL
  Filled 2015-11-23 (×6): qty 1

## 2015-11-23 MED ORDER — LEVOTHYROXINE SODIUM 25 MCG PO TABS: 25.0000 ug | ORAL_TABLET | Freq: Every day | ORAL | Status: AC

## 2015-11-23 MED ORDER — PANTOPRAZOLE SODIUM 40 MG PO TBEC: 40.0000 mg | DELAYED_RELEASE_TABLET | Freq: Every day | ORAL | Status: AC

## 2015-11-23 MED ORDER — ONDANSETRON HCL 4 MG PO TABS
4.0000 mg | ORAL_TABLET | Freq: Four times a day (QID) | ORAL | Status: DC | PRN
  Administered 2015-11-30 – 2015-12-03 (×5): 4 mg via ORAL
  Filled 2015-11-23 (×5): qty 1

## 2015-11-23 MED ORDER — INSULIN GLARGINE 100 UNIT/ML ~~LOC~~ SOLN: 10.0000 [IU] | Freq: Two times a day (BID) | SUBCUTANEOUS | 11 refills | Status: DC

## 2015-11-23 MED ORDER — CARVEDILOL 12.5 MG PO TABS: 12.5000 mg | ORAL_TABLET | Freq: Two times a day (BID) | ORAL | Status: DC

## 2015-11-23 MED ORDER — HYDROCODONE-ACETAMINOPHEN 5-325 MG PO TABS
1.0000 | ORAL_TABLET | Freq: Four times a day (QID) | ORAL | Status: DC | PRN
  Administered 2015-11-23 – 2015-12-04 (×31): 1 via ORAL
  Filled 2015-11-23 (×32): qty 1

## 2015-11-23 MED ORDER — ACETAMINOPHEN 325 MG PO TABS: 650.0000 mg | ORAL_TABLET | Freq: Four times a day (QID) | ORAL | Status: DC | PRN

## 2015-11-23 MED ORDER — ASPIRIN EC 81 MG PO TBEC
81.0000 mg | DELAYED_RELEASE_TABLET | Freq: Every day | ORAL | Status: DC
  Administered 2015-11-24 – 2015-12-04 (×10): 81 mg via ORAL
  Filled 2015-11-23 (×11): qty 1

## 2015-11-23 MED ORDER — SORBITOL 70 % SOLN: 30.0000 mL | Freq: Every day | Status: DC | PRN

## 2015-11-23 MED ORDER — INSULIN ASPART 100 UNIT/ML ~~LOC~~ SOLN: 0.0000 [IU] | Freq: Every day | SUBCUTANEOUS | 11 refills | Status: DC

## 2015-11-23 MED ORDER — TICAGRELOR 90 MG PO TABS: 90.0000 mg | ORAL_TABLET | Freq: Two times a day (BID) | ORAL | Status: AC

## 2015-11-23 NOTE — Progress Notes (Signed)
PT Cancellation Note  Patient Details Name: Dillon Thomas MRN: [id number] DOB: 08-28-1950   Cancelled Treatment:    Reason Eval/Treat Not Completed: Other (comment) Pt going over paperwork for CIR admission today. PT will continue to follow acutely until d/c to CIR.    Salina April, PTA Pager: [phone]   11/23/2015, 2:52 PM

## 2015-11-23 NOTE — Care Management Important Message (Signed)
Important Message  Patient Details  Name: Dillon Thomas MRN: [id number] Date of Birth: Sep 14, 1950   Medicare Important Message Given:  Yes    Nathen May 11/23/2015, 12:18 PM

## 2015-11-23 NOTE — Progress Notes (Signed)
CIR is admitting patient. CSW signing off.  Percell Locus Selby Slovacek LCSWA [phone]

## 2015-11-23 NOTE — Interval H&P Note (Signed)
Dillon Thomas was admitted today to Inpatient Rehabilitation with the diagnosis of debilitation.  The patient's history has been reviewed, patient examined, and there is no change in status.  Patient continues to be appropriate for intensive inpatient rehabilitation.  I have reviewed the patient's chart and labs.  Questions were answered to the patient's satisfaction. The PAPE has been reviewed and assessment remains appropriate.  Dillon Thomas 11/23/2015, 6:38 PM

## 2015-11-23 NOTE — Progress Notes (Signed)
PROGRESS NOTE    Dillon Thomas  [id number] DOB: 10-Apr-1950 DOA: 11/15/2015 PCP: [name], MD   Chief Complaint  Patient presents with  . Altered Mental Status  . Hyperglycemia    Brief Narrative:  HPI 11/15/2015 by Dr. Lily Kocher Dillon Thomas is a 65 y.o. gentleman with a history of uncontrolled type 2 DM, CKD 3 (baseline creatinine around 2), peripheral neuropathy, prior TIA, GERD, and hypothyroidism who has had progressive gait instability ("shuffling gait" per his significant other), back pain, and speech difficulties (referred to neurology at [facility] in the summer; he has seen speech therapy as an outpatient; last MRI in the [place] system was in 2015) who was last known to be in his baseline state of health around 11:30AM this morning.  Presently, he is obtunded and unable to engage in any type of effective communication.  This history of taken from his significant other, ED documentation, and outside medical records.  The patient's significant other found him down at home around 5PM. The patient was making sounds with his mouth but could not speak.  He was incontinent of urine.  He was staring into space.  She immediately called 911. Assessment & Plan   HHNK -Appears to have resolved  Diabetes mellitus, type II, uncontrolled -Hemoglobin A1c 10 -Continue Lantus, insulin sliding scale, CBG monitoring  Acute on chronic kidney disease, stage III -Baseline creatinine appears to be 1.5-1.9 -Called patient's PCP, creatinine on 03/04/2015 was 1.9 -Upon admission, creatinine was 3.48. Currently 3.63 -Possibly due to dehydration from HHNK versus ATN vs GN -Renal US: no obstructing focus.   -Nephrology consulted and appreciated- pending ANA, ANCA -C3 and C4 WNL, hepatitis panel unremarkable, HIV nonreactive -Nephrology restarted lasix and transitioned to torsemide 40mg  BID  -Possibly decrease torsemide to 20mg  daily   Right Kidney Cystic Lesion/pancreatic lesion -noted  on renal US -Spoke with Dr. Posey Pronto, nephrology.  Although MRI with contrast is recommended, given patient's current creatinine, will order MR without contrast -MRI abd showed ?RCC and suspicion for lymphoproliferative disorder, cystic pancreatic head lesion. Needs MRI with and without contrast and PET-CT to further examine. -Spoke with Dr. Jeffie Pollock, urology, who recommended possible IR consult for biopsy -Spoke with Dr. Earleen Newport, IR, who recommended re-imaging in 3-6 months with [place] or when kidneys improve, imaging with contrast  Normocytic Anemia -Drop may be secondary to dilutional component -Anemia panel: iron 10 -feraheme ordered -Nephrology will also start weekly aranesp -Per family, patient has been having loose stools for the past month and has been very dark/black in color. -Patient supposedly did have a colonoscopy a few years ago in [place]. States he has chronic anemia. -Continue to monitor CBC -Transfused 1uPRBC on 11/5, hemoglobin currently 8  Hypothyroidism -TSH 2.954, free T4 1 0.02 -Called patient's PCP, takes synthroid 38mcg daily  -Continue synthroid  Elevated Troponin/NSTEMI -Cardiology consulted and appreciated -Echocardiogram: EF of 50-55%, left ventricular diastolic function parameters were normal. -Plan for cardiac cath in 2-4 weeks  Uncontrolled Hypertension -Continue Coreg and hydralazine as needed  Acute Encephalopathy -Likely multifactorial -Appears to improving -EEG showed no deformity activity -MRI brain without contrast: No evidence of acute infarction. Extensive extra-axial mass lesion along the floor of the anterior cranial fossa/planum sphenoidale region most consistent with meningioma  Possible meningioma -Seen on MRI brain without contrast. Patient will need contrast for further evaluation. However given patient's renal function, will likely be done as an outpatient. -Will speak with neurosurgery  Possible seizure/history of CVA -Specific cardiac  home, patient is back to his baseline speech. He does have some gait disturbances. -EEG conducted, unremarkable -Inpatient rehabilitation consulted and appreciated  GERD -Continue PPI  Hypomagnesemia/hypokalemia/hyponatremia -Resolved, continue to monitor BMP  DVT Prophylaxis  heparin  Code Status: Full  Family Communication: None at bedside.   Disposition Plan: Admitted. Continue to work up anemia, AKI. Possible discharge to inpatient rehab on 11/6  Consultants PCCM Cardiology Neurology Nephrology Inpatient rehab Urology, Dr. Jeffie Pollock, via phone Interventional radiology, Dr. Earleen Newport, via phone  Procedures  Echocardiogram EEG Renal US  Antibiotics   Anti-infectives    None      Subjective:   Dillon Thomas seen and examined today.  Patient feels better this morning.  Worried about his blood counts.  Denies chest pain, shortness of breath, abdominal pain, nausea or vomiting.     Objective:   Vitals:   11/22/15 1737 11/22/15 2138 11/23/15 0454 11/23/15 0902  BP: (!) 145/59 (!) 166/70 (!) 169/67 (!) 186/64  Pulse: 90 88 77 80  Resp:  18 18 16   Temp: 98.5 F (36.9 C) 97.4 F (36.3 C) 97.8 F (36.6 C) 97.7 F (36.5 C)  TempSrc: Oral Oral Oral Oral  SpO2: 100% 99% 98% 100%  Weight:      Height:        Intake/Output Summary (Last 24 hours) at 11/23/15 1042 Last data filed at 11/23/15 0934  Gross per 24 hour  Intake              664 ml  Output             1025 ml  Net             -361 ml   Filed Weights   11/15/15 1827 11/17/15 1737 11/18/15 2307  Weight: 99.8 kg (220 lb) 101.8 kg (224 lb 6.9 oz) 101.4 kg (223 lb 8.7 oz)    Exam  General: Well developed, chronically ill appearing, NAD  HEENT: NCAT, mucous membranes moist.   Cardiovascular: S1 S2 auscultated, RRR, no murmurs  Respiratory: Clear to auscultation bilaterally with equal chest rise  Abdomen: Soft, obese, nondistended, nontender, + bowel sounds  Extremities: warm dry without cyanosis  clubbing. +LE Edema  Neuro: AAOx3, nonfocal  Psych: Normal affect and demeanor, pleasant   Data Reviewed: I have personally reviewed following labs and imaging studies  CBC:  Recent Labs Lab 11/17/15 0220  11/18/15 0404 11/19/15 0737 11/20/15 0326 11/22/15 0510 11/23/15 0601  WBC 11.5*  --  11.3* 9.2 8.9 9.5 10.8*  NEUTROABS 6.6  --  6.4  --   --   --   --   HGB 7.6*  < > 7.7* 7.8* 7.7* 7.3* 8.0*  HCT 23.4*  < > 24.0* 24.5* 23.8* 23.1* 25.4*  MCV 86.7  --  87.0 86.9 86.5 87.5 89.1  PLT 299  --  337 369 352 422* 445*  < > = values in this interval not displayed. Basic Metabolic Panel:  Recent Labs Lab 11/17/15 0220 11/18/15 0404 11/19/15 0737 11/20/15 0326 11/21/15 0542 11/22/15 0510 11/23/15 0601  NA 138 135 134* 135 138 137 138  K 3.5 3.8 3.6 3.6 3.5 3.7 3.9  CL 114* 111 109 108 111 108 109  CO2 17* 16* 18* 20* 20* 21* 19*  GLUCOSE 193* 229* 90 87 31* 177* 118*  BUN 37* 41* 43* 50* 51* 51* 56*  CREATININE 3.13* 3.20* 3.17* 3.44* 3.49* 3.60* 3.63*  CALCIUM 8.4* 8.5* 8.4* 8.3* 8.3* 7.9* 8.2*  MG  1.4* 1.8  --   --   --   --  1.6*  PHOS 4.9* 5.1* 4.7*  --   --   --   --    GFR: Estimated Creatinine Clearance: 24.3 mL/min (by C-G formula based on SCr of 3.63 mg/dL (H)). Liver Function Tests:  Recent Labs Lab 11/17/15 0220 11/18/15 0404 11/19/15 0737  ALBUMIN 1.5* 1.6* 1.5*   No results for input(s): LIPASE, AMYLASE in the last 168 hours. No results for input(s): AMMONIA in the last 168 hours. Coagulation Profile: No results for input(s): INR, PROTIME in the last 168 hours. Cardiac Enzymes:  Recent Labs Lab 11/20/15 0326  CKTOTAL 55   BNP (last 3 results) No results for input(s): PROBNP in the last 8760 hours. HbA1C: No results for input(s): HGBA1C in the last 72 hours. CBG:  Recent Labs Lab 11/22/15 0809 11/22/15 1232 11/22/15 1711 11/22/15 2136 11/23/15 0806  GLUCAP 155* 198* 326* 239* 124*   Lipid Profile: No results for input(s):  CHOL, HDL, LDLCALC, TRIG, CHOLHDL, LDLDIRECT in the last 72 hours. Thyroid Function Tests: No results for input(s): TSH, T4TOTAL, FREET4, T3FREE, THYROIDAB in the last 72 hours. Anemia Panel: No results for input(s): VITAMINB12, FOLATE, FERRITIN, TIBC, IRON, RETICCTPCT in the last 72 hours. Urine analysis:    Component Value Date/Time   COLORURINE ORANGE (A) 11/19/2015 1340   APPEARANCEUR CLOUDY (A) 11/19/2015 1340   APPEARANCEUR Clear 01/09/2013 1858   LABSPEC 1.017 11/19/2015 1340   LABSPEC 1.015 01/09/2013 1858   PHURINE 5.5 11/19/2015 1340   GLUCOSEU 250 (A) 11/19/2015 1340   GLUCOSEU >=500 01/09/2013 1858   HGBUR LARGE (A) 11/19/2015 1340   BILIRUBINUR NEGATIVE 11/19/2015 1340   BILIRUBINUR Negative 01/09/2013 1858   KETONESUR NEGATIVE 11/19/2015 1340   PROTEINUR >300 (A) 11/19/2015 1340   NITRITE NEGATIVE 11/19/2015 1340   LEUKOCYTESUR NEGATIVE 11/19/2015 1340   LEUKOCYTESUR Negative 01/09/2013 1858   Sepsis Labs: @LABRCNTIP (procalcitonin:4,lacticidven:4)  ) Recent Results (from the past 240 hour(s))  MRSA PCR Screening     Status: None   Collection Time: 11/16/15  4:45 AM  Result Value Ref Range Status   MRSA by PCR NEGATIVE NEGATIVE Final    Comment:        The GeneXpert MRSA Assay (FDA approved for NASAL specimens only), is one component of a comprehensive MRSA colonization surveillance program. It is not intended to diagnose MRSA infection nor to guide or monitor treatment for MRSA infections.   Culture, Urine     Status: None   Collection Time: 11/16/15 11:25 AM  Result Value Ref Range Status   Specimen Description URINE, CATHETERIZED  Final   Special Requests Normal  Final   Culture NO GROWTH  Final   Report Status 11/17/2015 FINAL  Final  Culture, blood (routine x 2)     Status: None   Collection Time: 11/16/15 12:15 PM  Result Value Ref Range Status   Specimen Description BLOOD RIGHT HAND  Final   Special Requests IN PEDIATRIC BOTTLE 3ML  Final    Culture NO GROWTH 5 DAYS  Final   Report Status 11/21/2015 FINAL  Final  Culture, blood (routine x 2)     Status: None   Collection Time: 11/16/15 12:20 PM  Result Value Ref Range Status   Specimen Description BLOOD LEFT HAND  Final   Special Requests IN PEDIATRIC BOTTLE 3ML  Final   Culture NO GROWTH 5 DAYS  Final   Report Status 11/21/2015 FINAL  Final  Radiology Studies: No results found.   Scheduled Meds: . aspirin EC  81 mg Oral Daily  . carvedilol  12.5 mg Oral BID WC  . darbepoetin (ARANESP) injection - NON-DIALYSIS  100 mcg Subcutaneous Q Fri-1800  . donepezil  10 mg Oral QHS  . feeding supplement (NEPRO CARB STEADY)  237 mL Oral BID BM  . ferumoxytol  510 mg Intravenous Weekly  . folic acid  1 mg Oral Daily  . heparin subcutaneous  5,000 Units Subcutaneous Q8H  . insulin aspart  0-5 Units Subcutaneous QHS  . insulin aspart  0-9 Units Subcutaneous TID WC  . insulin aspart  3 Units Subcutaneous TID WC  . insulin glargine  10 Units Subcutaneous BID  . levothyroxine  25 mcg Oral QAC breakfast  . montelukast  10 mg Oral QHS  . pantoprazole  40 mg Oral Daily  . rosuvastatin  20 mg Oral q1800  . thiamine  100 mg Oral Daily  . ticagrelor  90 mg Oral BID  . torsemide  20 mg Oral Daily   Continuous Infusions:   LOS: 8 days   Time Spent in minutes   30 minutes  Filbert Craze D.O. on 11/23/2015 at 10:42 AM  Between 7am to 7pm - Pager - [phone]  After 7pm go to [email or web address] - password [email or web address]  And look for the night coverage person covering for me after hours  [facility] Office  [phone]

## 2015-11-23 NOTE — Progress Notes (Signed)
Ankit Lorie Phenix, MD Physician Signed Physical Medicine and Rehabilitation  Consult Note Date of Service: 11/18/2015 2:14 PM  Related encounter: ED to Hosp-Admission (Current) from 11/15/2015 in [place] All Collapse All   [] Hide copied text [] Hover for attribution information      Physical Medicine and Rehabilitation Consult Reason for Consult: DKA Referring Physician: [facility]    HPI: Dillon Thomas is a 65 y.o. right handed male with history of uncontrolled type 2 diabetes mellitus with peripheral neuropathy, CKD-3 baseline creatinine 2.00, TIA. Presented 11/15/2015 with progressive shuffling gait. Per chart review patient lives alone. Reportedly independent prior to admission still driving short distances. Patient was found down by significant other and could not speak. Findings of elevated glucose greater than 700. Hemoglobin A1c of 10. MRI of the brain showed no evidence of acute infarct. MRI cervical spine with spondylosis and some canal narrowing from C4-5 through C7-T1 most pronounced at C4-5. Patient received 2 L of normal saline started on insulin infusion per protocol. Hypertensive systolic pressure [id number] as well as tachycardia 120s. Troponin 0.33. Creatinine on admission 3.30 from baseline around 2.00. Echocardiogram with ejection fraction of 55% no wall motion abnormalities. EEG showed mild diffuse slowing of the electrocerebral activity, no seizure activity. Neurology consulted with workup ongoing. Cardiology consulted for elevated troponin suspect NSTEMI with Brilinta added. Plan coronary angiography when patient medically stable and metabolic issues resolved. Physical therapy evaluation completed 11/18/2015 with recommendations of physical medicine rehabilitation consult.  Review of Systems  Constitutional: Negative for chills and fever.  HENT: Negative for hearing loss.   Eyes: Negative for double vision.  Respiratory: Negative  for cough and shortness of breath.   Cardiovascular: Positive for leg swelling. Negative for chest pain.  Gastrointestinal: Positive for constipation. Negative for nausea and vomiting.       GERD  Genitourinary: Positive for urgency. Negative for dysuria and hematuria.  Musculoskeletal: Positive for joint pain and myalgias.  Skin: Negative for rash.  Neurological: Positive for weakness. Negative for headaches.  Psychiatric/Behavioral: Positive for depression.  All other systems reviewed and are negative.      Past Medical History:  Diagnosis Date  . BPH (benign prostatic hyperplasia)   . Chronic kidney disease   . Depression   . Diabetes mellitus without complication ([place])   . GERD (gastroesophageal reflux disease)   . Hyperlipidemia   . Hypertension   . Hypothyroidism   . TIA (transient ischemic attack)         Past Surgical History:  Procedure Laterality Date  . APPENDECTOMY    . CHOLECYSTECTOMY    . CYSTOURETHROSCOPY    . POLYPECTOMY     Vocal cord polyp removed  . VASECTOMY          Family History  Problem Relation Age of Onset  . CAD Mother   . Diabetes Mother   . Hyperlipidemia Mother   . Hypertension Mother   . CVA Mother   . Alcohol abuse Father   . Diabetes Father   . Hyperlipidemia Father    Social History:  reports that he quit smoking about 3 years ago. He has never used smokeless tobacco. He reports that he does not drink alcohol or use drugs. Allergies:       Allergies  Allergen Reactions  . Penicillins Other (See Comments)    Unknown  . Tetracyclines & Related Other (See Comments)    Unknown  Medications Prior to Admission  Medication Sig Dispense Refill  . allopurinol (ZYLOPRIM) 100 MG tablet Take 100 mg by mouth daily.    Marland Kitchen buPROPion (WELLBUTRIN XL) 300 MG 24 hr tablet Take 300 mg by mouth daily.    Marland Kitchen donepezil (ARICEPT) 10 MG tablet Take 10 mg by mouth at bedtime.    . hydrochlorothiazide  (HYDRODIURIL) 25 MG tablet Take 25 mg by mouth daily.    . insulin aspart protamine - aspart (NOVOLOG 70/30 MIX) (70-30) 100 UNIT/ML FlexPen Inject 80 units subque with breakfast and 72 units subque with dinner.    . linagliptin (TRADJENTA) 5 MG TABS tablet Take 5 mg by mouth daily.    . metoprolol succinate (TOPROL-XL) 25 MG 24 hr tablet Take 37.5 mg by mouth daily.     . montelukast (SINGULAIR) 10 MG tablet Take 10 mg by mouth at bedtime.    . ranitidine (ZANTAC) 150 MG tablet Take 150 mg by mouth 2 (two) times daily.    . rosuvastatin (CRESTOR) 10 MG tablet Take 10 mg by mouth daily.    Marland Kitchen HYDROcodone-acetaminophen (NORCO/VICODIN) 5-325 MG tablet Take 1 tablet by mouth every 6 (six) hours as needed for moderate pain.      Home: Home Living Family/patient expects to be discharged to:: Private residence Living Arrangements: Alone Available Help at Discharge: Family, Friend(s), Available PRN/intermittently Type of Home: House Home Access: Stairs to enter Technical brewer of Steps: 3 Pillow: One level Bathroom Shower/Tub: Multimedia programmer: [place]: Environmental consultant - 4 wheels, [place] - single point Additional Comments: pt reports that he is supposed to walk with his cane or rollator but that he doesn't  Functional History: Prior Function Level of Independence: Independent Comments: Drives. Reports that he speaks with girlfriend on the phone daily. Independent with ADL but does have difficulty getting off of low toilet. Functional Status:  Mobility: Bed Mobility General bed mobility comments: pt received in chair Transfers Overall transfer level: Needs assistance Equipment used: Rolling walker (2 wheeled) Transfers: Sit to/from Stand Sit to Stand: Mod assist, +2 physical assistance General transfer comment: Mod assist provided on pts R side; unable to provide physical assist on L due to pain. Ambulation/Gait Ambulation/Gait assistance:  Min assist, +2 safety/equipment Ambulation Distance (Feet): 50 Feet Assistive device: 4-wheeled walker Gait Pattern/deviations: Decreased stride length, Step-to pattern General Gait Details: pt with very short steps and c/o left sided pain throughout ambulation.  Gait velocity: decreased Gait velocity interpretation: <1.8 ft/sec, indicative of risk for recurrent falls    ADL: ADL Overall ADL's : Needs assistance/impaired Eating/Feeding: Set up, Sitting Grooming: Set up, Supervision/safety, Sitting Upper Body Bathing: Minimal assitance, Sitting Lower Body Bathing: Minimal assistance, Sit to/from stand Upper Body Dressing : Minimal assistance, Sitting Lower Body Dressing: Minimal assistance, Sit to/from stand Lower Body Dressing Details (indicate cue type and reason): Limited by pain in L side. Toilet Transfer: Minimal assistance, Ambulation, BSC, RW, +2 for safety/equipment Toilet Transfer Details (indicate cue type and reason): Simulated by sit to stand from chair with functional mobility. Functional mobility during ADLs: Minimal assistance, +2 for safety/equipment, Rolling walker General ADL Comments: Pt noted to be limiting use of LUE due to pain in L side with movement; encouraged functional use of LUE as tolerated and performing gentle AROM to shoulder throughout day.   Cognition: Cognition Overall Cognitive Status: No family/caregiver present to determine baseline cognitive functioning (questionable historian) Orientation Level: Oriented to person, Oriented to place, Oriented to time, Disoriented to situation  Cognition Arousal/Alertness: Awake/alert Behavior During Therapy: Flat affect, WFL for tasks assessed/performed Overall Cognitive Status: No family/caregiver present to determine baseline cognitive functioning (questionable historian) Memory: Decreased short-term memory  Blood pressure (!) 149/72, pulse 77, temperature 97.8 F (36.6 C), temperature source Oral, resp.  rate 12, height 5\' 11"  (1.803 m), weight 101.8 kg (224 lb 6.9 oz), SpO2 99 %. Physical Exam  Vitals reviewed. Constitutional: He appears well-developed.  Obese  HENT:  Head: Normocephalic and atraumatic.  Eyes: Conjunctivae and EOM are normal.  Neck: Normal range of motion. Neck supple. No thyromegaly present.  Cardiovascular: Normal rate and regular rhythm.   Respiratory: Effort normal and breath sounds normal. No respiratory distress.  GI: Soft. Bowel sounds are normal. He exhibits no distension.  Musculoskeletal: He exhibits edema. He exhibits no tenderness.  Neurological: He is alert.  Some delay in processing. Follow simple commands Sensation intact to light touch Motor: B/l LUE: 4+/5 distal RUE: 4/5 proximal to distal RLE: hip flexion 4/5, knee extension 4+/5, ankle dorsi/plantar flexion 5/5 LLE; Hip flexion 2/5, knee extension 4/5, ankle dorsi/plantar flexion 5/5  Skin:  Skin tear that is bleeding on left forearm  Psychiatric: He has a normal mood and affect. His behavior is normal.    Lab Results Last 24 Hours       Results for orders placed or performed during the hospital encounter of 11/15/15 (from the past 24 hour(s))  Glucose, capillary     Status: Abnormal   Collection Time: 11/17/15  4:27 PM  Result Value Ref Range   Glucose-Capillary 281 (H) 65 - 99 mg/dL   Comment 1 Capillary Specimen    Comment 2 Notify RN   Hemoglobin and hematocrit, blood     Status: Abnormal   Collection Time: 11/17/15  4:53 PM  Result Value Ref Range   Hemoglobin 7.9 (L) 13.0 - 17.0 g/dL   HCT 25.3 (L) 39.0 - 52.0 %  Glucose, capillary     Status: Abnormal   Collection Time: 11/17/15  7:47 PM  Result Value Ref Range   Glucose-Capillary 322 (H) 65 - 99 mg/dL  Glucose, capillary     Status: Abnormal   Collection Time: 11/17/15 11:06 PM  Result Value Ref Range   Glucose-Capillary 306 (H) 65 - 99 mg/dL  Glucose, capillary     Status: Abnormal   Collection Time:  11/18/15  4:00 AM  Result Value Ref Range   Glucose-Capillary 232 (H) 65 - 99 mg/dL  Procalcitonin     Status: None   Collection Time: 11/18/15  4:04 AM  Result Value Ref Range   Procalcitonin 3.30 ng/mL  CBC with Differential/Platelet     Status: Abnormal   Collection Time: 11/18/15  4:04 AM  Result Value Ref Range   WBC 11.3 (H) 4.0 - 10.5 K/uL   RBC 2.76 (L) 4.22 - 5.81 MIL/uL   Hemoglobin 7.7 (L) 13.0 - 17.0 g/dL   HCT 24.0 (L) 39.0 - 52.0 %   MCV 87.0 78.0 - 100.0 fL   MCH 27.9 26.0 - 34.0 pg   MCHC 32.1 30.0 - 36.0 g/dL   RDW 14.9 11.5 - 15.5 %   Platelets 337 150 - 400 K/uL   Neutrophils Relative % 57 %   Neutro Abs 6.4 1.7 - 7.7 K/uL   Lymphocytes Relative 28 %   Lymphs Abs 3.2 0.7 - 4.0 K/uL   Monocytes Relative 15 %   Monocytes Absolute 1.7 (H) 0.1 - 1.0 K/uL   Eosinophils Relative 0 %  Eosinophils Absolute 0.0 0.0 - 0.7 K/uL   Basophils Relative 0 %   Basophils Absolute 0.0 0.0 - 0.1 K/uL  Magnesium     Status: None   Collection Time: 11/18/15  4:04 AM  Result Value Ref Range   Magnesium 1.8 1.7 - 2.4 mg/dL  Renal function panel     Status: Abnormal   Collection Time: 11/18/15  4:04 AM  Result Value Ref Range   Sodium 135 135 - 145 mmol/L   Potassium 3.8 3.5 - 5.1 mmol/L   Chloride 111 101 - 111 mmol/L   CO2 16 (L) 22 - 32 mmol/L   Glucose, Bld 229 (H) 65 - 99 mg/dL   BUN 41 (H) 6 - 20 mg/dL   Creatinine, Ser 3.20 (H) 0.61 - 1.24 mg/dL   Calcium 8.5 (L) 8.9 - 10.3 mg/dL   Phosphorus 5.1 (H) 2.5 - 4.6 mg/dL   Albumin 1.6 (L) 3.5 - 5.0 g/dL   GFR calc non Af Amer 19 (L) >60 mL/min   GFR calc Af Amer 22 (L) >60 mL/min   Anion gap 8 5 - 15  Glucose, capillary     Status: Abnormal   Collection Time: 11/18/15  7:42 AM  Result Value Ref Range   Glucose-Capillary 192 (H) 65 - 99 mg/dL   Comment 1 Notify RN    Comment 2 Document in Chart   Glucose, capillary     Status: Abnormal   Collection Time: 11/18/15 11:16  AM  Result Value Ref Range   Glucose-Capillary 320 (H) 65 - 99 mg/dL   Comment 1 Notify RN    Comment 2 Document in Chart       Imaging Results (Last 48 hours)  Mr Brain Wo Contrast  Result Date: 11/17/2015 CLINICAL DATA:  Progressive gait instability and speech disturbance. Obtunded. EXAM: MRI HEAD WITHOUT CONTRAST TECHNIQUE: Multiplanar, multiecho pulse sequences of the brain and surrounding structures were obtained without intravenous contrast. COMPARISON:  Head CT 11/15/2015 and 01/09/2013 FINDINGS: Brain: Diffusion imaging does not show any acute or subacute infarction. The brainstem is normal. There are a few old small vessel cerebellar infarctions. Cerebral hemispheres show atrophy with mild chronic small-vessel ischemic changes of the deep white matter. There is an old infarction in the right caudate/ periventricular deep white matter with hemosiderin deposition. Ventricles are asymmetry with the right lateral ventricle being larger than the left, but this is felt to be largely normal variant, possibly with some contribution from the old periventricular infarction on the right. No evidence of intra-axial mass lesion, acute hemorrhage or extra-axial collection. The patient does show an extensive extra-axial mass along the planum sphenoidale with probable invasion of the cavernous sinuses and extension along the middle cranial fossae on the left medially. This is most likely represent an extensive meningioma. Postcontrast evaluation recommended when able. Vascular: Major vessels the base of the brain show flow. Skull and upper cervical spine: Chronic benign sclerotic focus in the inferior left frontal bone is unchanged and not significant. Hypercellular pattern of the clivus is correlated with the previous CT studies and is probably stable. This is probably a normal marrow pattern,, though it could possibly relate in some way to the meningioma. Sinuses/Orbits: Paranasal sinuses are clear.  There are bilateral mastoid effusions. Other: None significant IMPRESSION: No evidence of acute infarction. Atrophy and chronic small-vessel ischemic changes. Ventricular asymmetry with the right lateral ventricle being more prominent is a chronic finding. Extensive extra-axial mass lesion along the floor of the anterior cranial  fossa/ planum sphenoidale region most consistent with meningioma. This may involve the cavernous sinuses and does extend along the medial aspect of the middle cranial fossa on the left. Postcontrast evaluation suggested when able. Indeterminate marrow pattern of the clivus. This can be re-evaluated after contrast administration. Electronically Signed   By: Nelson Chimes M.D.   On: 11/17/2015 07:27   Mr Cervical Spine Wo Contrast  Result Date: 11/17/2015 CLINICAL DATA:  Acute onset of altered mental status. Found unresponsive on the ground. EXAM: MRI CERVICAL SPINE WITHOUT CONTRAST TECHNIQUE: Multiplanar, multisequence MR imaging of the cervical spine was performed. No intravenous contrast was administered. COMPARISON:  MRI brain same day FINDINGS: The study suffers from considerable motion degradation. Alignment: Straightening of the normal cervical lordosis. Vertebrae: Abnormal marrow pattern of the clivus as discussed at brain imaging. This could represent benign hypercellular marrow, but re-evaluation after contrast administration is suggested, particularly given the finding of the skullbase meningioma at brain imaging. No marrow or focal bone lesion seen in the cervical region proper. Cord: No primary cord lesion. Spinal stenosis C4-5 causes some deformity of the cord common Tower. Posterior Fossa, vertebral arteries, paraspinal tissues: Few old small vessel cerebellar infarctions. Vessels are patent. Disc levels: Foramen magnum and C1-2:  Wide patency. C2-3:  Wide patency. C3-4: Mild facet degeneration. Mild disc bulge. No significant stenosis. C4-5: Suspicion of ossification of  the posterior longitudinal ligament. Spondylosis with endplate osteophytes and bulging disc material. Effacement of the subarachnoid space surrounding the cord without evidence of true cord compression. Detail is somewhat limited, but AP diameter of the canal probably measures 7.5 mm. There is bilateral foraminal stenosis. C5-6: Suspicion of ossification of the posterior longitudinal ligament. Spondylosis with endplate osteophytes and protruding disc material. Effacement of the subarachnoid space anteriorly. AP diameter of the canal 8 mm. Bilateral foraminal stenosis could affect the C6 nerve roots. C6-7: Suspicion of 0PLL. Spondylosis. Effacement of the ventral subarachnoid space. No cord compression. Foraminal narrowing left more than right. C7-T1: Suspicion of 0PLL. Spondylosis. Narrowing of the ventral subarachnoid space but no cord compression. No foraminal stenosis. IMPRESSION: Motion degraded exam. Suspicion of ossification of the posterior longitudinal ligament from C4 through C7. This is not definite and could represent changes associated with spondylosis. In combination with spondylosis, there is canal narrowing from C4-5 through C7-T1, most pronounced at C4-5, C5-6 and C6-7. AP diameter of canal probably measures between 7 and 9 mm throughout that region. There is effacement of the subarachnoid space surrounding the cord in that region. Foraminal stenosis at C4-5, C5-6 and C6-7. Re- demonstration of indeterminate marrow pattern of the clivus. See above discussion. This should be re-evaluated at postcontrast brain imaging as previously discussed. Electronically Signed   By: Nelson Chimes M.D.   On: 11/17/2015 07:35     Assessment/Plan: Diagnosis: Debility secondary to DKA and NSTEMI Labs and images independently reviewed.  Records reviewed and summated above.  1. Does the need for close, 24 hr/day medical supervision in concert with the patient's rehab needs make it unreasonable for this patient  to be served in a less intensive setting? Yes  2. Co-Morbidities requiring supervision/potential complications: NSTEMI (cont meds, further workup up when appropriate), Tachycardia (monitor in accordance with pain and increasing activity), HTN (monitor and provide prns in accordance with increased physical exertion and pain), cervical spine with spondylosis (avoid excessive hyperextension), uncontrolled type 2 diabetes mellitus with peripheral neuropathy and DKA (Monitor in accordance with exercise and adjust meds as necessary), AKI on CKD-2 (avoid  nephrotixic meds), TIA (cont meds), leukocytosis (cont to monitor for signs and symptoms of infection, further workup if indicated), ABLA (transfuse if necessary to ensure appropriate perfusion for increased activity tolerance), hypothyroidism (cont meds, ensure appropriate mood and energy level for therapies), probably hemangioma (futher workup as indicated) 3. Due to bladder management, bowel management, safety, skin/wound care, disease management, medication administration and patient education, does the patient require 24 hr/day rehab nursing? Yes 4. Does the patient require coordinated care of a physician, rehab nurse, PT (1-2 hrs/day, 5 days/week) and OT (1-2 hrs/day, 5 days/week) to address physical and functional deficits in the context of the above medical diagnosis(es)? Yes Addressing deficits in the following areas: balance, endurance, locomotion, strength, transferring, bathing, dressing, toileting and psychosocial support 5. Can the patient actively participate in an intensive therapy program of at least 3 hrs of therapy per day at least 5 days per week? Yes 6. The potential for patient to make measurable gains while on inpatient rehab is excellent 7. Anticipated functional outcomes upon discharge from inpatient rehab are modified independent  with PT, modified independent and supervision with OT, n/a with SLP. 8. Estimated rehab length of stay to  reach the above functional goals is: 10-14 days. 9. Does the patient have adequate social supports and living environment to accommodate these discharge functional goals? Potentially 10. Anticipated D/C setting: Home 11. Anticipated post D/C treatments: HH therapy and Home excercise program 12. Overall Rehab/Functional Prognosis: excellent  RECOMMENDATIONS: This patient's condition is appropriate for continued rehabilitative care in the following setting: CIR  Patient has agreed to participate in recommended program. Yes Note that insurance prior authorization may be required for reimbursement for recommended care.  Comment: Rehab Admissions Coordinator to follow up.  Delice Lesch, MD, [facility] 11/18/2015    Revision History                        Routing History

## 2015-11-23 NOTE — H&P (View-Only) (Signed)
Physical Medicine and Rehabilitation Admission H&P    Chief Complaint  Patient presents with  . Altered Mental Status  . Hyperglycemia  : HPI: Dillon Thomas is a 65 y.o. right handed male with history of uncontrolled type 2 diabetes mellitus with peripheral neuropathy, CKD-3 baseline creatinine 1.90 - 2.00, TIA. Presented 11/15/2015 with progressive shuffling gait. Per chart review patient lives alone. Reportedly independent prior to admission still driving short distances. Patient was found down by significant other and could not speak. Findings of elevated glucose greater than 700. Hemoglobin A1c of 10. MRI of the brain showed no evidence of acute infarct. MRI cervical spine with spondylosis and some canal narrowing from C4-5 through C7-T1 most pronounced at C4-5. Patient received 2 L of normal saline started on insulin infusion per protocol. Hypertensive systolic pressure 212-248 as well as tachycardia 120s. Troponin 7.46. Creatinine on admission 3.30 from baseline around 2.00 and presently 3.17 with renal ultrasound showing no obstruction Nephrology consulted for follow-up in suspect prerenal etiology with hypoperfusion from hyperosmolar diuresis and perhaps ACE inhibitor. There was a cystic mass arising from the upper pole the right kidney measuring 2.5 x 2.8 x 2.2 cm. MRI suggested but could not be completed with contrast due to renal insufficiency and MR abdomen without contrast completed 11/20/2015 showing no identified renal mass. There was some noted renal cysts and recommendations for reimaging in 3-6 months Echocardiogram with ejection fraction of 55% no wall motion abnormalities. EEG showed mild diffuse slowing of the electrocerebral activity, no seizure activity. Cardiology consulted for elevated troponin suspect NSTEMI with Brilinta added. Plan coronary angiography when patient medically stable and metabolic issues resolve, in the next 2-4 weeks.MBS 11/19/2015 maintained on a regular  diet. Physical and occupational therapy evaluations completed 11/18/2015 with recommendations of physical medicine rehabilitation consult.Patient was admitted for a comprehensive rehabilitation program  ROS Constitutional: Negative for chills and fever.  HENT: Negative for hearing loss.   Eyes: Negative for double vision.  Respiratory: Negative for cough and shortness of breath.   Cardiovascular: Positive for leg swelling. Negative for chest pain.  Gastrointestinal: Positive for constipation. Negative for nausea and vomiting.       GERD  Genitourinary: Positive for urgency. Negative for dysuria and hematuria.  Musculoskeletal: Positive for joint pain.  Skin: Negative for rash.  Neurological: Positive for weakness. Negative for headaches.  Psychiatric/Behavioral: Positive for depression.  All other systems reviewed and are negative   Past Medical History:  Diagnosis Date  . BPH (benign prostatic hyperplasia)   . Chronic kidney disease   . Depression   . Diabetes mellitus without complication ([place])   . GERD (gastroesophageal reflux disease)   . Hyperlipidemia   . Hypertension   . Hypothyroidism   . TIA (transient ischemic attack)    Past Surgical History:  Procedure Laterality Date  . APPENDECTOMY    . CHOLECYSTECTOMY    . CYSTOURETHROSCOPY    . POLYPECTOMY     Vocal cord polyp removed  . VASECTOMY     Family History  Problem Relation Age of Onset  . CAD Mother   . Diabetes Mother   . Hyperlipidemia Mother   . Hypertension Mother   . CVA Mother   . Alcohol abuse Father   . Diabetes Father   . Hyperlipidemia Father    Social History:  reports that he quit smoking about 3 years ago. He has never used smokeless tobacco. He reports that he does not drink alcohol or use drugs. Allergies:  Allergies  Allergen Reactions  . Penicillins Other (See Comments)    Unknown  . Tetracyclines & Related Other (See Comments)    Unknown   Medications Prior to Admission    Medication Sig Dispense Refill  . allopurinol (ZYLOPRIM) 100 MG tablet Take 100 mg by mouth daily.    Marland Kitchen buPROPion (WELLBUTRIN XL) 300 MG 24 hr tablet Take 300 mg by mouth daily.    Marland Kitchen donepezil (ARICEPT) 10 MG tablet Take 10 mg by mouth at bedtime.    . hydrochlorothiazide (HYDRODIURIL) 25 MG tablet Take 25 mg by mouth daily.    . insulin aspart protamine - aspart (NOVOLOG 70/30 MIX) (70-30) 100 UNIT/ML FlexPen Inject 80 units subque with breakfast and 72 units subque with dinner.    . linagliptin (TRADJENTA) 5 MG TABS tablet Take 5 mg by mouth daily.    . metoprolol succinate (TOPROL-XL) 25 MG 24 hr tablet Take 37.5 mg by mouth daily.     . montelukast (SINGULAIR) 10 MG tablet Take 10 mg by mouth at bedtime.    . ranitidine (ZANTAC) 150 MG tablet Take 150 mg by mouth 2 (two) times daily.    . rosuvastatin (CRESTOR) 10 MG tablet Take 10 mg by mouth daily.    Marland Kitchen HYDROcodone-acetaminophen (NORCO/VICODIN) 5-325 MG tablet Take 1 tablet by mouth every 6 (six) hours as needed for moderate pain.      Home: Home Living Family/patient expects to be discharged to:: Private residence Living Arrangements: Alone Available Help at Discharge: Family, Friend(s), Available PRN/intermittently Type of Home: House Home Access: Stairs to enter Technical brewer of Steps: 3 [place]: One level Bathroom Shower/Tub: Multimedia programmer: [place]: Environmental consultant - 4 wheels, [place] - single point Additional Comments: pt reports that he is supposed to walk with his cane or rollator but that he doesn't   Functional History: Prior Function Level of Independence: Independent Comments: Drives. Reports that he speaks with girlfriend on the phone daily. Independent with ADL but does have difficulty getting off of low toilet.  Functional Status:  Mobility: Bed Mobility General bed mobility comments: pt received in chair Transfers Overall transfer level: Needs assistance Equipment used:  Rolling walker (2 wheeled) Transfers: Sit to/from Stand Sit to Stand: Mod assist, +2 physical assistance General transfer comment: Mod assist provided on pts R side; unable to provide physical assist on L due to pain. Ambulation/Gait Ambulation/Gait assistance: Min assist, +2 safety/equipment Ambulation Distance (Feet): 50 Feet Assistive device: 4-wheeled walker Gait Pattern/deviations: Decreased stride length, Step-to pattern General Gait Details: pt with very short steps and c/o left sided pain throughout ambulation.  Gait velocity: decreased Gait velocity interpretation: <1.8 ft/sec, indicative of risk for recurrent falls    ADL: ADL Overall ADL's : Needs assistance/impaired Eating/Feeding: Set up, Sitting Grooming: Set up, Supervision/safety, Sitting Upper Body Bathing: Minimal assitance, Sitting Lower Body Bathing: Minimal assistance, Sit to/from stand Upper Body Dressing : Minimal assistance, Sitting Lower Body Dressing: Minimal assistance, Sit to/from stand Lower Body Dressing Details (indicate cue type and reason): Limited by pain in L side. Toilet Transfer: Minimal assistance, Ambulation, BSC, RW, +2 for safety/equipment Toilet Transfer Details (indicate cue type and reason): Simulated by sit to stand from chair with functional mobility. Functional mobility during ADLs: Minimal assistance, +2 for safety/equipment, Rolling walker General ADL Comments: Pt noted to be limiting use of LUE due to pain in L side with movement; encouraged functional use of LUE as tolerated and performing gentle AROM to shoulder throughout day.  Cognition: Cognition Overall Cognitive Status: No family/caregiver present to determine baseline cognitive functioning (questionable historian) Orientation Level: Oriented to person, Oriented to place, Oriented to time, Disoriented to situation (can't remember what happened ) Cognition Arousal/Alertness: Awake/alert Behavior During Therapy: Flat affect,  WFL for tasks assessed/performed Overall Cognitive Status: No family/caregiver present to determine baseline cognitive functioning (questionable historian) Memory: Decreased short-term memory  Physical Exam: Blood pressure (!) 164/68, pulse 92, temperature 97.8 F (36.6 C), temperature source Oral, resp. rate 18, height 5' 11"  (1.803 m), weight 101.4 kg (223 lb 8.7 oz), SpO2 99 %. Physical Exam Constitutional: He appears well-developed. NAD. Obese  HENT:  Head: Normocephalic and atraumatic.  Eyes: Conjunctivae and EOM are normal.  Neck: Normal range of motion. Neck supple. No thyromegaly present.  Cardiovascular: Normal rate and regular rhythm. No JVD. Respiratory: Effort normal and breath sounds normal. No respiratory distress.  GI: Soft. Bowel sounds are normal. He exhibits no distension.  Musculoskeletal: He exhibits edema. He exhibits no tenderness.  Neurological: He is alert.  Delay in processing. Follow simple commands Sensation intact to light touch Motor: LUE: 4+/5 proximal to distal RUE: 4/5 proximal to distal RLE: hip flexion 4/5, knee extension 4+/5, ankle dorsi/plantar flexion 5/5 LLE; Hip flexion 3-/5, knee extension 4/5, ankle dorsi/plantar flexion 5/5  Skin:  Skin tear on left forearm healing. Psychiatric: He has a normal mood and affect. His behavior is normal   Results for orders placed or performed during the hospital encounter of 11/15/15 (from the past 48 hour(s))  Glucose, capillary     Status: Abnormal   Collection Time: 11/17/15  7:59 AM  Result Value Ref Range   Glucose-Capillary 191 (H) 65 - 99 mg/dL   Comment 1 Capillary Specimen    Comment 2 Notify RN   Glucose, capillary     Status: Abnormal   Collection Time: 11/17/15 11:51 AM  Result Value Ref Range   Glucose-Capillary 278 (H) 65 - 99 mg/dL   Comment 1 Capillary Specimen    Comment 2 Notify RN   Glucose, capillary     Status: Abnormal   Collection Time: 11/17/15  4:27 PM  Result Value Ref  Range   Glucose-Capillary 281 (H) 65 - 99 mg/dL   Comment 1 Capillary Specimen    Comment 2 Notify RN   Hemoglobin and hematocrit, blood     Status: Abnormal   Collection Time: 11/17/15  4:53 PM  Result Value Ref Range   Hemoglobin 7.9 (L) 13.0 - 17.0 g/dL   HCT 25.3 (L) 39.0 - 52.0 %  Glucose, capillary     Status: Abnormal   Collection Time: 11/17/15  7:47 PM  Result Value Ref Range   Glucose-Capillary 322 (H) 65 - 99 mg/dL  Glucose, capillary     Status: Abnormal   Collection Time: 11/17/15 11:06 PM  Result Value Ref Range   Glucose-Capillary 306 (H) 65 - 99 mg/dL  Glucose, capillary     Status: Abnormal   Collection Time: 11/18/15  4:00 AM  Result Value Ref Range   Glucose-Capillary 232 (H) 65 - 99 mg/dL  Procalcitonin     Status: None   Collection Time: 11/18/15  4:04 AM  Result Value Ref Range   Procalcitonin 3.30 ng/mL    Comment:        Interpretation: PCT > 2 ng/mL: Systemic infection (sepsis) is likely, unless other causes are known. (NOTE)         ICU PCT Algorithm  Non ICU PCT Algorithm    ----------------------------     ------------------------------         PCT < 0.25 ng/mL                 PCT < 0.1 ng/mL     Stopping of antibiotics            Stopping of antibiotics       strongly encouraged.               strongly encouraged.    ----------------------------     ------------------------------       PCT level decrease by               PCT < 0.25 ng/mL       >= 80% from peak PCT       OR PCT 0.25 - 0.5 ng/mL          Stopping of antibiotics                                             encouraged.     Stopping of antibiotics           encouraged.    ----------------------------     ------------------------------       PCT level decrease by              PCT >= 0.25 ng/mL       < 80% from peak PCT        AND PCT >= 0.5 ng/mL            Continuing antibiotics                                               encouraged.       Continuing antibiotics             encouraged.    ----------------------------     ------------------------------     PCT level increase compared          PCT > 0.5 ng/mL         with peak PCT AND          PCT >= 0.5 ng/mL             Escalation of antibiotics                                          strongly encouraged.      Escalation of antibiotics        strongly encouraged.   CBC with Differential/Platelet     Status: Abnormal   Collection Time: 11/18/15  4:04 AM  Result Value Ref Range   WBC 11.3 (H) 4.0 - 10.5 K/uL   RBC 2.76 (L) 4.22 - 5.81 MIL/uL   Hemoglobin 7.7 (L) 13.0 - 17.0 g/dL   HCT 24.0 (L) 39.0 - 52.0 %   MCV 87.0 78.0 - 100.0 fL   MCH 27.9 26.0 - 34.0 pg   MCHC 32.1 30.0 - 36.0 g/dL   RDW 14.9 11.5 - 15.5 %   Platelets 337 150 - 400 K/uL   Neutrophils Relative %  57 %   Neutro Abs 6.4 1.7 - 7.7 K/uL   Lymphocytes Relative 28 %   Lymphs Abs 3.2 0.7 - 4.0 K/uL   Monocytes Relative 15 %   Monocytes Absolute 1.7 (H) 0.1 - 1.0 K/uL   Eosinophils Relative 0 %   Eosinophils Absolute 0.0 0.0 - 0.7 K/uL   Basophils Relative 0 %   Basophils Absolute 0.0 0.0 - 0.1 K/uL  Magnesium     Status: None   Collection Time: 11/18/15  4:04 AM  Result Value Ref Range   Magnesium 1.8 1.7 - 2.4 mg/dL  Renal function panel     Status: Abnormal   Collection Time: 11/18/15  4:04 AM  Result Value Ref Range   Sodium 135 135 - 145 mmol/L   Potassium 3.8 3.5 - 5.1 mmol/L   Chloride 111 101 - 111 mmol/L   CO2 16 (L) 22 - 32 mmol/L   Glucose, Bld 229 (H) 65 - 99 mg/dL   BUN 41 (H) 6 - 20 mg/dL   Creatinine, Ser 3.20 (H) 0.61 - 1.24 mg/dL   Calcium 8.5 (L) 8.9 - 10.3 mg/dL   Phosphorus 5.1 (H) 2.5 - 4.6 mg/dL   Albumin 1.6 (L) 3.5 - 5.0 g/dL   GFR calc non Af Amer 19 (L) >60 mL/min   GFR calc Af Amer 22 (L) >60 mL/min    Comment: (NOTE) The eGFR has been calculated using the CKD EPI equation. This calculation has not been validated in all clinical situations. eGFR's persistently <60 mL/min signify  possible Chronic Kidney Disease.    Anion gap 8 5 - 15  Glucose, capillary     Status: Abnormal   Collection Time: 11/18/15  7:42 AM  Result Value Ref Range   Glucose-Capillary 192 (H) 65 - 99 mg/dL   Comment 1 Notify RN    Comment 2 Document in Chart   Glucose, capillary     Status: Abnormal   Collection Time: 11/18/15 11:16 AM  Result Value Ref Range   Glucose-Capillary 320 (H) 65 - 99 mg/dL   Comment 1 Notify RN    Comment 2 Document in Chart   Glucose, capillary     Status: Abnormal   Collection Time: 11/18/15  5:04 PM  Result Value Ref Range   Glucose-Capillary 255 (H) 65 - 99 mg/dL   Comment 1 Notify RN    Comment 2 Document in Chart   Glucose, capillary     Status: Abnormal   Collection Time: 11/18/15  8:52 PM  Result Value Ref Range   Glucose-Capillary 280 (H) 65 - 99 mg/dL   No results found.     Medical Problem List and Plan: 1.  Debilitation/acute encephalopathy secondary to DKA/NSTEMI--plan cardiac catheterization 2-4 weeks per Dr. Einar Gip 2.  DVT Prophylaxis/Anticoagulation: Subcutaneous heparin. Monitor platelet counts and any signs of bleeding 3. Pain Management: Hydrocodone as needed 4. Mood: Aricept 10 mg daily at bedtime 5. Neuropsych: This patient is capable of making decisions on his own behalf. 6. Skin/Wound Care: Routine skin checks 7. Fluids/Electrolytes/Nutrition: Routine I&O with follow-up chemistries 8. CKD-3 with baseline creatinine 2.00. Renal service continued to follow with workup ongoing. Demadex initiated 11/20/1998 1740 mg twice a day. Follow-up chemistries 9. Uncontrolled hypertension. Coreg 12.5 mg twice a day 10. Diabetes mellitus with peripheral neuropathy. Hemoglobin A1c of 10. Lantus insulin 10 units twice a day. Check blood sugars before meals and at bedtime. Home regimen of trandjenta and NovoLog 70/30 80 units at breakfast, 72 units with  dinner 11. Hyperlipidemia. Crestor 12. Hypothyroidism.TSH 2.954.Continue synthroid 13. Acute on  chronic anemia. Follow-up CBC. Continue Aranesp 14. Cervical spondylosis: Monitor 15. Leukocytosis: Follow CBC  Post Admission Physician Evaluation: 1. Functional deficits secondary  to debilitation. 2. Patient is admitted to receive collaborative, interdisciplinary care between the physiatrist, rehab nursing staff, and therapy team. 3. Patient's level of medical complexity and substantial therapy needs in context of that medical necessity cannot be provided at a lesser intensity of care such as a SNF. 4. Patient has experienced substantial functional loss from his/her baseline which was documented above under the "Functional History" and "Functional Status" headings.  Judging by the patient's diagnosis, physical exam, and functional history, the patient has potential for functional progress which will result in measurable gains while on inpatient rehab.  These gains will be of substantial and practical use upon discharge  in facilitating mobility and self-care at the household level. 5. Physiatrist will provide 24 hour management of medical needs as well as oversight of the therapy plan/treatment and provide guidance as appropriate regarding the interaction of the two. 6. 24 hour rehab nursing will assist with bladder management, bowel management, safety, skin/wound care, disease management, medication administration and patient education and help integrate therapy concepts, techniques,education, etc. 7. PT will assess and treat for/with: Lower extremity strength, range of motion, stamina, balance, functional mobility, safety, adaptive techniques and equipment, coping skills, pain control, education.   Goals are: Mod I. 8. OT will assess and treat for/with: ADL's, functional mobility, safety, upper extremity strength, adaptive techniques and equipment, ego support, and community reintegration.   Goals are: Mod I/Supervision. Therapy may proceed with showering this patient. 9. Case Management and Social  Worker will assess and treat for psychological issues and discharge planning. 10. Team conference will be held weekly to assess progress toward goals and to determine barriers to discharge. 11. Patient will receive at least 3 hours of therapy per day at least 5 days per week. 12. ELOS: 10-14 days.       13. Prognosis:  good  Delice Lesch, MD, Mellody Drown 11/19/2015

## 2015-11-23 NOTE — Progress Notes (Signed)
Patient ID: Dillon Thomas, male   DOB: 1950/02/19, 65 y.o.   MRN: [id number]   Request for renal mass biopsy Dr Earleen Newport reviewed imaging and chart Has discussed with Dr Ree Kida  No identified renal mass per MRI MR not great quality Does reveal renal cysts  Rec: re imaging 3-6 mo May have improvement in renal function and could receive contrast which would enhance kidneys.  Dr Ree Kida agreeable

## 2015-11-23 NOTE — Discharge Summary (Signed)
Physician Discharge Summary  Dillon Thomas [id number] DOB: [birth year] DOA: 11/15/2015  PCP: [name], MD  Admit date: 11/15/2015 Discharge date: 11/23/2015  Time spent: 45 minutes  Recommendations for Outpatient Follow-up:  Patient will be discharged to inpatient rehab. Continue physical and occupational therapy.  Upon discharge from inpatient rehab, patient will need to follow up with his primary care physician and cardiology. Patient will need to have repeat renal ultrasound in 3-6 months as well as repeat CT and MRI brain in 6 months.  Continue to monitor CBC and BMP.    Discharge Diagnoses:  HHNK Diabetes mellitus, type II, uncontrolled Acute on chronic kidney disease, stage III Right Kidney Cystic Lesion/pancreatic lesion Normocytic Anemia Hypothyroidism Elevated Troponin/NSTEMI Uncontrolled Hypertension Acute Encephalopathy Possible meningioma Possible seizure/history of CVA GERD Hypomagnesemia/hypokalemia/hyponatremia  Discharge Condition: Stable  Diet recommendation: carb modified  Filed Weights   11/15/15 1827 11/17/15 1737 11/18/15 2307  Weight: 99.8 kg (220 lb) 101.8 kg (224 lb 6.9 oz) 101.4 kg (223 lb 8.7 oz)    History of present illness:  On 11/15/2015 by Dr. Madelaine Etienne Mooreis a 65 y.o.gentleman with a history of uncontrolled type 2 DM, CKD 3 (baseline creatinine around 2), peripheral neuropathy, prior TIA, GERD, and hypothyroidism who has had progressive gait instability ("shuffling gait" per his significant other), back pain, and speech difficulties (referred to neurology at [facility] in the summer; he has seen speech therapy as an outpatient; last MRI in the [facility] was in 2015) who was last known to be in his baseline state of health around 11:30AM this morning. Presently, he is obtunded and unable to engage in any type of effective communication. This history of taken from his significant other, ED documentation, and outside  medical records. The patient's significant other found him down at home around 5PM. The patient was making sounds with his mouth but could not speak. He was incontinent of urine. He was staring into space. She immediately called 911.  Hospital Course:  HHNK -Appears to have resolved  Diabetes mellitus, type II, uncontrolled -Hemoglobin A1c 10 -Continue Lantus, insulin sliding scale, CBG monitoring -Adding on premeal insulin  Acute on chronic kidney disease, stage III -Baseline creatinine appears to be 1.5-1.9 -Called patient's PCP, creatinine on 03/04/2015 was 1.9 -Upon admission, creatinine was 3.48. Currently 3.63 -Possibly due to dehydration from HHNK versus ATN vs GN -Renal US: no obstructing focus.   -Nephrology consulted and appreciated- pending ANA, ANCA -C3 and C4 WNL, hepatitis panel unremarkable, HIV nonreactive -Nephrology restarted lasix and transitioned to torsemide 40mg  BID  -Possibly decrease torsemide to 20mg  daily   Right Kidney Cystic Lesion/pancreatic lesion -noted on renal US -Spoke with Dr. Posey Pronto, nephrology.  Although MRI with contrast is recommended, given patient's current creatinine, will order MR without contrast -MRI abd showed ?RCC and suspicion for lymphoproliferative disorder, cystic pancreatic head lesion. Needs MRI with and without contrast and PET-CT to further examine. -Spoke with Dr. Jeffie Pollock, urology, who recommended possible IR consult for biopsy -Spoke with Dr. Earleen Newport, IR, who recommended re-imaging in 3-6 months with [place] or when kidneys improve, imaging with contrast  Normocytic Anemia -Drop may be secondary to dilutional component -Anemia panel: iron 10 -feraheme ordered -Nephrology will also start weekly aranesp -Per family, patient has been having loose stools for the past month and has been very dark/black in color. -Patient supposedly did have a colonoscopy a few years ago in [place]. States he has chronic anemia. -Continue to  monitor CBC -Transfused  1uPRBC on 11/5, hemoglobin currently 8  Hypothyroidism -TSH 2.954, free T4 1 0.02 -Called patient's PCP, takes synthroid 16mcg daily  -Continue synthroid  Elevated Troponin/NSTEMI -Cardiology consulted and appreciated -Echocardiogram: EF of 50-55%, left ventricular diastolic function parameters were normal. -Plan for cardiac cath in 2-4 weeks  Uncontrolled Hypertension -Continue Coreg and hydralazine as needed  Acute Encephalopathy -Likely multifactorial -Appears to improving -EEG showed no deformity activity -MRI brain without contrast: No evidence of acute infarction. Extensive extra-axial mass lesion along the floor of the anterior cranial fossa/planum sphenoidale region most consistent with meningioma  Possible meningioma -Seen on MRI brain without contrast. Patient will need contrast for further evaluation. However given patient's renal function, will likely be done as an outpatient. -Spoke with Dr. Ellene Route, neurosurg, and reviewed MRI brain. Since questionable meningioma is not causing midline shift, bone invasion, etc, consider MRI/CT in 6 months. This is likely not causing patient's encephalopathy or gait instability. Possibly incidental finding.   Possible seizure/history of CVA -Specific cardiac home, patient is back to his baseline speech. He does have some gait disturbances. -EEG conducted, unremarkable -Inpatient rehabilitation consulted and appreciated  GERD -Continue PPI  Hypomagnesemia/hypokalemia/hyponatremia -Resolved, continue to monitor [facility]  Consultants PCCM Cardiology Neurology Nephrology Inpatient rehab Urology, Dr. Jeffie Pollock, via phone Interventional radiology, Dr. Earleen Newport, via phone Neurosurgery, Dr. Ellene Route, via phone  Procedures  Echocardiogram EEG Renal US  Discharge Exam: Vitals:   11/23/15 1049 11/23/15 1355  BP: (!) 137/57 (!) 152/65  Pulse: 75 78  Resp:  18  Temp:  97.6 F (36.4 C)   Exam  General:  Well developed, chronically ill appearing, NAD  HEENT: NCAT, mucous membranes moist.   Cardiovascular: S1 S2 auscultated, RRR, no murmurs  Respiratory: Clear to auscultation bilaterally with equal chest rise  Abdomen: Soft, obese, nondistended, nontender, + bowel sounds  Extremities: warm dry without cyanosis clubbing. +LE Edema  Neuro: AAOx3, nonfocal  Psych: Normal affect and demeanor, pleasant  Discharge Instructions  Current Discharge Medication List    START taking these medications   Details  aspirin EC 81 MG EC tablet Take 1 tablet (81 mg total) by mouth daily.    bisacodyl (DULCOLAX) 10 MG suppository Place 1 suppository (10 mg total) rectally daily as needed for moderate constipation. Qty: 12 suppository, Refills: 0    carvedilol (COREG) 12.5 MG tablet Take 1 tablet (12.5 mg total) by mouth 2 (two) times daily with a meal.    Darbepoetin Alfa (ARANESP) 100 MCG/0.5ML SOSY injection Inject 0.5 mLs (100 mcg total) into the skin every Friday at 6 PM. Qty: 4.2 mL    folic acid (FOLVITE) 1 MG tablet Take 1 tablet (1 mg total) by mouth daily.    !! insulin aspart (NOVOLOG) 100 UNIT/ML injection Inject 0-5 Units into the skin at bedtime. Qty: 10 mL, Refills: 11    !! insulin aspart (NOVOLOG) 100 UNIT/ML injection Inject 4 Units into the skin 3 (three) times daily with meals. Qty: 10 mL, Refills: 11    !! insulin aspart (NOVOLOG) 100 UNIT/ML injection Inject 0-9 Units into the skin 3 (three) times daily with meals. Qty: 10 mL, Refills: 11    insulin glargine (LANTUS) 100 UNIT/ML injection Inject 0.1 mLs (10 Units total) into the skin 2 (two) times daily. Qty: 10 mL, Refills: 11    levothyroxine (SYNTHROID, LEVOTHROID) 25 MCG tablet Take 1 tablet (25 mcg total) by mouth daily before breakfast.    Nutritional Supplements (FEEDING SUPPLEMENT, NEPRO CARB STEADY,) LIQD Take 237 mLs by  mouth 2 (two) times daily between meals. Refills: 0    pantoprazole (PROTONIX) 40 MG  tablet Take 1 tablet (40 mg total) by mouth daily.    thiamine 100 MG tablet Take 1 tablet (100 mg total) by mouth daily.    ticagrelor (BRILINTA) 90 MG TABS tablet Take 1 tablet (90 mg total) by mouth 2 (two) times daily. Qty: 60 tablet    torsemide (DEMADEX) 20 MG tablet Take 1 tablet (20 mg total) by mouth daily.     !! - Potential duplicate medications found. Please discuss with provider.    CONTINUE these medications which have NOT CHANGED   Details  allopurinol (ZYLOPRIM) 100 MG tablet Take 100 mg by mouth daily.    donepezil (ARICEPT) 10 MG tablet Take 10 mg by mouth at bedtime.    montelukast (SINGULAIR) 10 MG tablet Take 10 mg by mouth at bedtime.    rosuvastatin (CRESTOR) 10 MG tablet Take 10 mg by mouth daily.    HYDROcodone-acetaminophen (NORCO/VICODIN) 5-325 MG tablet Take 1 tablet by mouth every 6 (six) hours as needed for moderate pain.      STOP taking these medications     buPROPion (WELLBUTRIN XL) 300 MG 24 hr tablet      hydrochlorothiazide (HYDRODIURIL) 25 MG tablet      insulin aspart protamine - aspart (NOVOLOG 70/30 MIX) (70-30) 100 UNIT/ML FlexPen      linagliptin (TRADJENTA) 5 MG TABS tablet      metoprolol succinate (TOPROL-XL) 25 MG 24 hr tablet      ranitidine (ZANTAC) 150 MG tablet        Allergies  Allergen Reactions  . Penicillins Other (See Comments)    Unknown  . Tetracyclines & Related Other (See Comments)    Unknown   Follow-up Information    Olmedo, Guy Begin, MD Follow up.   Specialty:  Family Medicine Contact information: [place] [address] [phone]        Adrian Prows, MD Follow up.   Specialty:  Cardiology Contact information: [address] [place] [address] [phone]            The results of significant diagnostics from this hospitalization (including imaging, microbiology, ancillary and laboratory) are listed below for reference.    Significant Diagnostic  Studies: Mr Brain Wo Contrast  Result Date: 11/17/2015 CLINICAL DATA:  Progressive gait instability and speech disturbance. Obtunded. EXAM: MRI HEAD WITHOUT CONTRAST TECHNIQUE: Multiplanar, multiecho pulse sequences of the brain and surrounding structures were obtained without intravenous contrast. COMPARISON:  Head CT 11/15/2015 and 01/09/2013 FINDINGS: Brain: Diffusion imaging does not show any acute or subacute infarction. The brainstem is normal. There are a few old small vessel cerebellar infarctions. Cerebral hemispheres show atrophy with mild chronic small-vessel ischemic changes of the deep white matter. There is an old infarction in the right caudate/ periventricular deep white matter with hemosiderin deposition. Ventricles are asymmetry with the right lateral ventricle being larger than the left, but this is felt to be largely normal variant, possibly with some contribution from the old periventricular infarction on the right. No evidence of intra-axial mass lesion, acute hemorrhage or extra-axial collection. The patient does show an extensive extra-axial mass along the planum sphenoidale with probable invasion of the cavernous sinuses and extension along the middle cranial fossae on the left medially. This is most likely represent an extensive meningioma. Postcontrast evaluation recommended when able. Vascular: Major vessels the base of the brain show flow. Skull  and upper cervical spine: Chronic benign sclerotic focus in the inferior left frontal bone is unchanged and not significant. Hypercellular pattern of the clivus is correlated with the previous CT studies and is probably stable. This is probably a normal marrow pattern,, though it could possibly relate in some way to the meningioma. Sinuses/Orbits: Paranasal sinuses are clear. There are bilateral mastoid effusions. Other: None significant IMPRESSION: No evidence of acute infarction. Atrophy and chronic small-vessel ischemic changes.  Ventricular asymmetry with the right lateral ventricle being more prominent is a chronic finding. Extensive extra-axial mass lesion along the floor of the anterior cranial fossa/ planum sphenoidale region most consistent with meningioma. This may involve the cavernous sinuses and does extend along the medial aspect of the middle cranial fossa on the left. Postcontrast evaluation suggested when able. Indeterminate marrow pattern of the clivus. This can be re-evaluated after contrast administration. Electronically Signed   By: Nelson Chimes M.D.   On: 11/17/2015 07:27   Mr Cervical Spine Wo Contrast  Result Date: 11/17/2015 CLINICAL DATA:  Acute onset of altered mental status. Found unresponsive on the ground. EXAM: MRI CERVICAL SPINE WITHOUT CONTRAST TECHNIQUE: Multiplanar, multisequence MR imaging of the cervical spine was performed. No intravenous contrast was administered. COMPARISON:  MRI brain same day FINDINGS: The study suffers from considerable motion degradation. Alignment: Straightening of the normal cervical lordosis. Vertebrae: Abnormal marrow pattern of the clivus as discussed at brain imaging. This could represent benign hypercellular marrow, but re-evaluation after contrast administration is suggested, particularly given the finding of the skullbase meningioma at brain imaging. No marrow or focal bone lesion seen in the cervical region proper. Cord: No primary cord lesion. Spinal stenosis C4-5 causes some deformity of the cord common Tower. Posterior Fossa, vertebral arteries, paraspinal tissues: Few old small vessel cerebellar infarctions. Vessels are patent. Disc levels: Foramen magnum and C1-2:  Wide patency. C2-3:  Wide patency. C3-4: Mild facet degeneration. Mild disc bulge. No significant stenosis. C4-5: Suspicion of ossification of the posterior longitudinal ligament. Spondylosis with endplate osteophytes and bulging disc material. Effacement of the subarachnoid space surrounding the cord  without evidence of true cord compression. Detail is somewhat limited, but AP diameter of the canal probably measures 7.5 mm. There is bilateral foraminal stenosis. C5-6: Suspicion of ossification of the posterior longitudinal ligament. Spondylosis with endplate osteophytes and protruding disc material. Effacement of the subarachnoid space anteriorly. AP diameter of the canal 8 mm. Bilateral foraminal stenosis could affect the C6 nerve roots. C6-7: Suspicion of 0PLL. Spondylosis. Effacement of the ventral subarachnoid space. No cord compression. Foraminal narrowing left more than right. C7-T1: Suspicion of 0PLL. Spondylosis. Narrowing of the ventral subarachnoid space but no cord compression. No foraminal stenosis. IMPRESSION: Motion degraded exam. Suspicion of ossification of the posterior longitudinal ligament from C4 through C7. This is not definite and could represent changes associated with spondylosis. In combination with spondylosis, there is canal narrowing from C4-5 through C7-T1, most pronounced at C4-5, C5-6 and C6-7. AP diameter of canal probably measures between 7 and 9 mm throughout that region. There is effacement of the subarachnoid space surrounding the cord in that region. Foraminal stenosis at C4-5, C5-6 and C6-7. Re- demonstration of indeterminate marrow pattern of the clivus. See above discussion. This should be re-evaluated at postcontrast brain imaging as previously discussed. Electronically Signed   By: Nelson Chimes M.D.   On: 11/17/2015 07:35   Mr Abdomen Wo Contrast  Result Date: 11/21/2015 CLINICAL DATA:  65 year old male inpatient with renal failure and  indeterminate cystic right renal mass on ultrasound, presenting for further characterization. EXAM: MRI ABDOMEN WITHOUT CONTRAST TECHNIQUE: Multiplanar multisequence MR imaging was performed without the administration of intravenous contrast. COMPARISON:  11/19/2015 renal sonogram and 12/15/2014 unenhanced CT abdomen/ pelvis. FINDINGS:  Study is significantly motion degraded. Lower chest: Moderate left pleural effusion. Hepatobiliary: Normal liver size and configuration. There is prominent iron deposition throughout the liver. No liver mass. Cholecystectomy. Bile ducts are within expected post cholecystectomy limits with mild central intrahepatic biliary ductal dilatation and common bile duct diameter 8 mm. No choledocholithiasis. Pancreas: No pancreatic duct dilation. There is a unilocular lobulated 2.0 x 1.1 x 1.1 cm cystic pancreatic lesion in the pancreatic head (series 7/ image 33). No additional pancreatic lesions. No evidence of pancreas divisum. Spleen: Normal size. No mass. There is prominent iron deposition throughout the spleen. Adrenals/Urinary Tract: No discrete adrenal nodules. No hydronephrosis. There is a 3.2 x 2.7 x 2.8 cm renal cortical mass in the upper right kidney (series 6/ image 66), which demonstrates heterogeneous T1 hyperintensity and mixed T2 signal intensity, with apparent 1.8 cm posterior mural nodule. There are similar appearing partially exophytic complex renal cysts in both kidneys, measuring 3.4 x 3.1 cm in the posterior lower right kidney (series 7/image 51) and 2.8 x 2.4 cm in the lateral interpolar left kidney (series 7/image 43), both demonstrating T1 hyperintensity and fluid debris levels on T2 imaging. Additional subcentimeter T2 hyperintense simple appearing renal cysts are present in both kidneys . Stomach/Bowel: Grossly normal stomach. Visualized small and large bowel is normal caliber, with no bowel wall thickening. Vascular/Lymphatic: There is apparent contrast material within the blood pool, possibly from recent administration of contrast given the provided history of renal failure (IV contrast was not administered on the scan). Atherosclerotic nonaneurysmal abdominal aorta. Retrocaval adenopathy measuring up to 1.8 cm (series 7/ image 41), increased from 1.4 cm on 12/15/2014. Left para-aortic adenopathy  measuring up to 02.0 cm (series 7/image 48), increased from 1.7 cm on 12/15/2014. Other: No abdominal ascites or focal fluid collection. Musculoskeletal: No aggressive appearing focal osseous lesions. IMPRESSION: 1. Limited motion degraded noncontrast MRI. 2. Indeterminate complex hemorrhagic/proteinaceous 3.2 cm renal cortical mass in the upper right kidney with apparent 1.8 cm mural nodule, cannot exclude renal cell carcinoma. Renal mass protocol MRI or CT of the abdomen without and with IV contrast is necessary for further characterization, when clinically feasible. 3. Additional indeterminate hemorrhagic/proteinaceous renal cortical masses in the posterior lower right kidney and lateral interpolar left kidney. 4. Retroperitoneal lymphadenopathy has increased mildly since 12/15/2014, and is suspicious for a lymphoproliferative disorder. Recommend correlation with clinical history and laboratory evaluation for possible lymphoproliferative disorder. PET-CT may be useful for further characterization and potential biopsy planning. 5. Unilocular 2.0 cm cystic pancreatic lesion in the pancreatic head without main pancreatic duct dilation, incompletely characterized on this noncontrast study. Pancreas protocol MRI abdomen without and with IV contrast is recommended in 6 months. This recommendation follows ACR consensus guidelines: Management of Incidental Pancreatic Cysts: A White Paper of the ACR Incidental Findings Committee. [place] [id number]. 6. Moderate left pleural effusion. 7. Prominent iron deposition throughout the liver and spleen, probably transfusion related. Electronically Signed   By: Ilona Sorrel M.D.   On: 11/21/2015 13:01   US Renal  Result Date: 11/19/2015 CLINICAL DATA:  Acute renal insufficiency EXAM: RENAL / URINARY TRACT ULTRASOUND COMPLETE COMPARISON:  None. FINDINGS: Right Kidney: Length: 12.2 cm. Echogenicity is increased. Renal cortical thickness is normal. No perinephric  fluid or hydronephrosis visualized. There is a cystic mass arising from the upper pole of the right kidney measuring 2.5 x 2.8 x 2.2 cm. Within this cystic mass, there is a solid-appearing focus measuring 1.4 x 1.2 x 1.2 cm. There is no sonographically demonstrable calculus or ureterectasis. Left Kidney: Length: 11.6 cm. Echogenicity is increased. Renal cortical thickness is normal. No perinephric fluid or hydronephrosis visualized. There is a cyst arising from the left kidney measuring 3.0 x 2.6 x 2.8 cm. No sonographically demonstrable calculus or ureterectasis. Bladder: Appears normal for degree of bladder distention. IMPRESSION: Kidneys are echogenic consistent with medical renal disease. No obstructing focus identified on either side. There is a cystic lesion arising from the upper pole of the right kidney which contains a solid focus. This appearance raises concern for potential renal neoplasm with a cystic component. Further evaluation with pre and post contrast MRI or CT should be considered. MRI is preferred in younger patients (due to lack of ionizing radiation) and for evaluating calcified lesion(s). Given the elevated creatinine, MRI may be a better choice for further assessment if there is no contraindication to MR imaging. There is a simple cyst in the left kidney. These results will be called to the ordering clinician or representative by the Radiologist Assistant, and communication documented in the PACS or zVision Dashboard. Electronically Signed   By: Lowella Grip III M.D.   On: 11/19/2015 14:57   Dg Chest Portable 1 View  Result Date: 11/15/2015 CLINICAL DATA:  Altered mental status.  The patient was found down. EXAM: PORTABLE CHEST 1 VIEW COMPARISON:  01/09/2013 FINDINGS: The heart size and mediastinal contours are within normal limits. Both lungs are clear. The visualized skeletal structures are unremarkable. IMPRESSION: Normal exam. Electronically Signed   By: Lorriane Shire M.D.    On: 11/15/2015 19:11   Dg Swallowing Func-speech Pathology  Result Date: 11/19/2015 Objective Swallowing Evaluation: Type of Study: MBS-Modified Barium Swallow Study Patient Details Name: JHAI LOVEGROVE MRN: [id number] Date of Birth: [birth year] Today's Date: 11/19/2015 Time: SLP Start Time (ACUTE ONLY): 1110-SLP Stop Time (ACUTE ONLY): 1132 SLP Time Calculation (min) (ACUTE ONLY): 22 min Past Medical History: Past Medical History: Diagnosis Date . BPH (benign prostatic hyperplasia)  . Chronic kidney disease  . Depression  . Diabetes mellitus without complication ([place])  . GERD (gastroesophageal reflux disease)  . Hyperlipidemia  . Hypertension  . Hypothyroidism  . TIA (transient ischemic attack)  Past Surgical History: Past Surgical History: Procedure Laterality Date . APPENDECTOMY   . CHOLECYSTECTOMY   . CYSTOURETHROSCOPY   . POLYPECTOMY    Vocal cord polyp removed . VASECTOMY   HPI: 65 yo male with hx uncontrolled DM, CKD 3, GERD, HTN, previous TIA, early dementia presented 10/30 with 1 week hx progressive weakness then found this am by his girlfriend/caregiver with AMS, unable to speak or get up. In ER was noted to have blood glucose >800, AKI with Scr 3.5, metabolic acidosis, HTN with SBP 200. He was admitted by [facility] with DKA/HHNK but there was some concern for CVA and MRI was ordered. In MRI pt had episode of emesis, study was aborted.  Subjective: alert Assessment / Plan / Recommendation CHL IP CLINICAL IMPRESSIONS 11/19/2015 Therapy Diagnosis Moderate pharyngeal phase dysphagia;Mild cervical esophageal phase dysphagia Clinical Impression Pt demonstrates a mild to moderate oropharyngeal dysphagia with mild motor and sensory deficits. The pts swallow is slightly delayed, combined with mildly decreased mobility of the hyolaryngeal complex. This results in penetration before, during and  after the swallow, sometimes aspiration if the bolus is large. Sensation is absent in the laryngeal vestibule and delayed in  the subglottis. Mild to moderate residuals with solids and liquids also result in penetration events post swallow. Positional changes were ineffective and, with a chin tuck, actually blocked the passage to the bolus, likely due to appearance of bony protrusions on the cervical esophagus at the level of the UES that impede bolus transit. This may also impact hyolaryngeal mobility. Best strategies to reduce aspiration risk are two swallows and intermittent throat clearing, following solids with liquids and taking small sips, no straws. Offered verbal and written instruction to pt. Will f/u for further training with a regular diet and thin liquids.  Impact on safety and function Moderate aspiration risk   CHL IP TREATMENT RECOMMENDATION 11/19/2015 Treatment Recommendations Therapy as outlined in treatment plan below   Prognosis 11/19/2015 Prognosis for Safe Diet Advancement Good Barriers to Reach Goals -- Barriers/Prognosis Comment -- [facility] IP DIET RECOMMENDATION 11/19/2015 SLP Diet Recommendations Regular solids;Thin liquid Liquid Administration via Cup;No straw Medication Administration Whole meds with puree Compensations Slow rate;Small sips/bites;Follow solids with liquid;Multiple dry swallows after each bite/sip;Clear throat intermittently Postural Changes Seated upright at 90 degrees   [facility] IP OTHER RECOMMENDATIONS 11/19/2015 Recommended Consults -- Oral Care Recommendations Oral care BID Other Recommendations --   [facility] IP FOLLOW UP RECOMMENDATIONS 11/19/2015 Follow up Recommendations Inpatient Rehab   [facility] IP FREQUENCY AND DURATION 11/19/2015 Speech Therapy Frequency (ACUTE ONLY) min 2x/week Treatment Duration 2 weeks      [facility] IP ORAL PHASE 11/19/2015 Oral Phase WFL Oral - Pudding Teaspoon -- Oral - Pudding Cup -- Oral - Honey Teaspoon -- Oral - Honey Cup -- Oral - Nectar Teaspoon -- Oral - Nectar Cup -- Oral - Nectar Straw -- Oral - Thin Teaspoon -- Oral - Thin Cup -- Oral - Thin Straw -- Oral - Puree -- Oral - Mech Soft  -- Oral - Regular -- Oral - Multi-Consistency -- Oral - Pill -- Oral Phase - Comment --  [facility] IP PHARYNGEAL PHASE 11/19/2015 Pharyngeal Phase Impaired Pharyngeal- Pudding Teaspoon -- Pharyngeal -- Pharyngeal- Pudding Cup -- Pharyngeal -- Pharyngeal- Honey Teaspoon -- Pharyngeal -- Pharyngeal- Honey Cup -- Pharyngeal -- Pharyngeal- Nectar Teaspoon -- Pharyngeal -- Pharyngeal- Nectar Cup -- Pharyngeal -- Pharyngeal- Nectar Straw -- Pharyngeal -- Pharyngeal- Thin Teaspoon -- Pharyngeal -- Pharyngeal- Thin Cup Delayed swallow initiation-pyriform sinuses;Reduced laryngeal elevation;Reduced anterior laryngeal mobility;Pharyngeal residue - valleculae;Pharyngeal residue - pyriform;Penetration/Aspiration before swallow;Penetration/Aspiration during swallow;Penetration/Apiration after swallow;Trace aspiration;Compensatory strategies attempted (with notebox) Pharyngeal Material does not enter airway;Material enters airway, remains ABOVE vocal cords and not ejected out;Material enters airway, CONTACTS cords and not ejected out;Material enters airway, passes BELOW cords then ejected out;Material enters airway, passes BELOW cords without attempt by patient to eject out (silent aspiration) Pharyngeal- Thin Straw Delayed swallow initiation-pyriform sinuses;Reduced laryngeal elevation;Reduced anterior laryngeal mobility;Pharyngeal residue - valleculae;Pharyngeal residue - pyriform;Penetration/Aspiration before swallow;Penetration/Aspiration during swallow;Penetration/Apiration after swallow;Trace aspiration;Compensatory strategies attempted (with notebox) Pharyngeal Material enters airway, passes BELOW cords without attempt by patient to eject out (silent aspiration);Material enters airway, passes BELOW cords then ejected out Pharyngeal- Puree Reduced laryngeal elevation;Reduced anterior laryngeal mobility;Pharyngeal residue - valleculae;Pharyngeal residue - pyriform Pharyngeal Material does not enter airway Pharyngeal- Mechanical  Soft -- Pharyngeal -- Pharyngeal- Regular Reduced laryngeal elevation;Reduced anterior laryngeal mobility;Pharyngeal residue - valleculae;Pharyngeal residue - pyriform Pharyngeal -- Pharyngeal- Multi-consistency -- Pharyngeal -- Pharyngeal- Pill -- Pharyngeal -- Pharyngeal Comment Delayed swallow initiation-pyriform sinuses;Reduced laryngeal elevation;Reduced anterior laryngeal mobility;Pharyngeal residue -  valleculae;Pharyngeal residue - pyriform;Penetration/Aspiration before swallow;Penetration/Aspiration during swallow;Penetration/Apiration after swallow;Trace aspiration;Compensatory strategies attempted (with notebox)  [facility] IP CERVICAL ESOPHAGEAL PHASE 11/19/2015 Cervical Esophageal Phase Impaired Pudding Teaspoon -- Pudding Cup -- Honey Teaspoon -- Honey Cup -- Nectar Teaspoon -- Nectar Cup -- Nectar Straw -- Thin Teaspoon -- Thin Cup -- Thin Straw -- Puree -- Mechanical Soft -- Regular -- Multi-consistency -- Pill -- Cervical Esophageal Comment With a chin tuck, thin liquids would not pass the cervical esophagus, likely due to bony protrusion on anterior cervical spine CHL IP GO 08/21/2015 Functional Assessment Tool Used Western Aphasia Battery- REvised, clinical judgment Functional Limitations Spoken language expressive Swallow Current Status [phone]) (None) Swallow Goal Status [id number]) (None) Swallow Discharge Status [id number]) (None) Motor Speech Current Status [phone]) (None) Motor Speech Goal Status [phone]) (None) Motor Speech Goal Status [id number]) (None) Spoken Language Comprehension Current Status [id number]) (None) Spoken Language Comprehension Goal Status [id number]) (None) Spoken Language Comprehension Discharge Status [phone]) (None) Spoken Language Expression Current Status [id number]) CJ Spoken Language Expression Goal Status [id number]) CI Spoken Language Expression Discharge Status [phone]) (None) Attention Current Status [id number]) (None) Attention Goal Status [id number]) (None) Attention Discharge Status [id number]) (None) Memory  Current Status [id number]) (None) Memory Goal Status [id number]) (None) Memory Discharge Status [id number]) (None) Voice Current Status [id number]) (None) Voice Goal Status [id number]) (None) Voice Discharge Status [id number]) (None) Other Speech-Language Pathology Functional Limitation [phone]) (None) Other Speech-Language Pathology Functional Limitation Goal Status [id number]) (None) Other Speech-Language Pathology Functional Limitation Discharge Status [phone]) (None) Herbie Baltimore, MA CCC-SLP [phone] [place], Katherene Ponto 11/19/2015, 11:57 AM              Ct Head Code Stroke Wo Contrast`  Result Date: 11/15/2015 CLINICAL DATA:  Code stroke. Altered mental status. Not using RIGHT side. EXAM: CT HEAD WITHOUT CONTRAST TECHNIQUE: Contiguous axial images were obtained from the base of the skull through the vertex without intravenous contrast. COMPARISON:  CT head 01/09/2013. FINDINGS: Brain: Advanced atrophy with chronic microvascular ischemic change. No visible hemorrhage, mass lesion, hydrocephalus, or extra-axial fluid. Vascular: No hyperdense vessel or parenchymal calcification. Advanced vascular calcification in the carotid siphon and distal vertebral arteries. No features suggestive of large vessel occlusion. Chronic periventricular deep white matter infarct, RIGHT parietal lobe. Skull: Normal. Negative for fracture or focal lesion. Sinuses/Orbits: No acute finding. Other: None ASPECTS (D[place]er Stroke Program Early CT Score) - Ganglionic level infarction (caudate, lentiform nuclei, internal capsule, insula, M1-M3 cortex): 7 - Supraganglionic infarction (M4-M6 cortex): 3 Total score (0-10 with 10 being normal): 10 IMPRESSION: 1. Atrophy and small vessel disease. No acute intracranial findings are evident. 2. ASPECTS is 10. These results were called by telephone at the time of interpretation on 11/15/2015 at 7:25 pm to Dr. Leonard Schwartz , who verbally acknowledged these results. Electronically Signed   By: Staci Righter M.D.   On:  11/15/2015 19:30    Microbiology: Recent Results (from the past 240 hour(s))  MRSA PCR Screening     Status: None   Collection Time: 11/16/15  4:45 AM  Result Value Ref Range Status   MRSA by PCR NEGATIVE NEGATIVE Final    Comment:        The GeneXpert MRSA Assay (FDA approved for NASAL specimens only), is one component of a comprehensive MRSA colonization surveillance program. It is not intended to diagnose MRSA infection nor to guide or monitor treatment for MRSA infections.   Culture, Urine     Status: None   Collection Time: 11/16/15 11:25 AM  Result Value Ref  Range Status   Specimen Description URINE, CATHETERIZED  Final   Special Requests Normal  Final   Culture NO GROWTH  Final   Report Status 11/17/2015 FINAL  Final  Culture, blood (routine x 2)     Status: None   Collection Time: 11/16/15 12:15 PM  Result Value Ref Range Status   Specimen Description BLOOD RIGHT HAND  Final   Special Requests IN PEDIATRIC BOTTLE 3ML  Final   Culture NO GROWTH 5 DAYS  Final   Report Status 11/21/2015 FINAL  Final  Culture, blood (routine x 2)     Status: None   Collection Time: 11/16/15 12:20 PM  Result Value Ref Range Status   Specimen Description BLOOD LEFT HAND  Final   Special Requests IN PEDIATRIC BOTTLE 3ML  Final   Culture NO GROWTH 5 DAYS  Final   Report Status 11/21/2015 FINAL  Final     Labs: Basic Metabolic Panel:  Recent Labs Lab 11/17/15 0220 11/18/15 0404 11/19/15 0737 11/20/15 0326 11/21/15 0542 11/22/15 0510 11/23/15 0601  NA 138 135 134* 135 138 137 138  K 3.5 3.8 3.6 3.6 3.5 3.7 3.9  CL 114* 111 109 108 111 108 109  CO2 17* 16* 18* 20* 20* 21* 19*  GLUCOSE 193* 229* 90 87 31* 177* 118*  BUN 37* 41* 43* 50* 51* 51* 56*  CREATININE 3.13* 3.20* 3.17* 3.44* 3.49* 3.60* 3.63*  CALCIUM 8.4* 8.5* 8.4* 8.3* 8.3* 7.9* 8.2*  MG 1.4* 1.8  --   --   --   --  1.6*  PHOS 4.9* 5.1* 4.7*  --   --   --   --    Liver Function Tests:  Recent Labs Lab  11/17/15 0220 11/18/15 0404 11/19/15 0737  ALBUMIN 1.5* 1.6* 1.5*   No results for input(s): LIPASE, AMYLASE in the last 168 hours. No results for input(s): AMMONIA in the last 168 hours. CBC:  Recent Labs Lab 11/17/15 0220  11/18/15 0404 11/19/15 0737 11/20/15 0326 11/22/15 0510 11/23/15 0601  WBC 11.5*  --  11.3* 9.2 8.9 9.5 10.8*  NEUTROABS 6.6  --  6.4  --   --   --   --   HGB 7.6*  < > 7.7* 7.8* 7.7* 7.3* 8.0*  HCT 23.4*  < > 24.0* 24.5* 23.8* 23.1* 25.4*  MCV 86.7  --  87.0 86.9 86.5 87.5 89.1  PLT 299  --  337 369 352 422* 445*  < > = values in this interval not displayed. Cardiac Enzymes:  Recent Labs Lab 11/20/15 0326  CKTOTAL 55   BNP: BNP (last 3 results)  Recent Labs  11/15/15 2157  BNP 597.3*    ProBNP (last 3 results) No results for input(s): PROBNP in the last 8760 hours.  CBG:  Recent Labs Lab 11/22/15 1232 11/22/15 1711 11/22/15 2136 11/23/15 0806 11/23/15 1158  GLUCAP 198* 326* 239* 124* 277*       Signed:  Cristal Ford  [facility] 11/23/2015, 2:41 PM

## 2015-11-23 NOTE — Progress Notes (Signed)
Patient was informed about rehab process including patient safety plan and rehab booklet. 

## 2015-11-23 NOTE — Progress Notes (Signed)
Inpatient Rehabilitation  I have discussed pt. with Dr. Ree Kida as well as Dr. Posey Pronto on IP Rehab.  Dr. Ree Kida has give medical clearance and   Dr. Posey Pronto is in agreement to admit the patient to CIR today.  Additionally, pt. Is in agreement with the plan.  I have updated the pt's RN as well as RNCM and CSW.  I will make arrangements for admission later today.  [facility] Admissions Coordinator Cell [phone] Office [phone]

## 2015-11-23 NOTE — Progress Notes (Signed)
Gerlean Ren [facility] Admission Coordinator Signed Physical Medicine and Rehabilitation  PMR Pre-admission Date of Service: 11/20/2015 1:54 PM  Related encounter: ED to Hosp-Admission (Current) from 11/15/2015 in [facility]       [] Hide copied text PMR Admission Coordinator Pre-Admission Assessment  Patient: Dillon Thomas is an 65 y.o., male MRN: [id number] DOB: [birth year] Height: 5\' 11"  (180.3 cm) Weight: 101.4 kg (223 lb 8.7 oz)                                                                                                                                                  Insurance Information HMO:     PPO:      PCP:      IPA:      80/20:      OTHER:  PRIMARY: Medicare A & B       Policy#: [id number]      Subscriber: Self CM Name:       Phone#:      Fax#:  Pre-Cert#: eligible per Passport [employer]      Employer: Retired  Benefits:  Phone #:      Name:  Eff. Date: 12/18/10     Deduct: $1,316      Out of Pocket Max: None      Life Max: Unlimited  CIR: 100%      SNF: 100% days 1-20. 80% days 21-100       Outpatient: PT/OT/SLP 80%     Co-Pay: 20% Home Health: PT/OT/SLP      Co-Pay: $0 DME: 80     Co-Pay: 20% Providers: patient's choice   SECONDARY: Generic Commercial       Policy#: [id number]      Subscriber: Self CM Name:       Phone#:      Fax#:  Pre-Cert#:       Employer: Retired  Benefits:  Phone #: [phone]     Name:  Eff. Date:      Deduct:       Out of Pocket Max:       Life Max:  CIR:       SNF:  Outpatient:      Co-Pay:  Home Health:       Co-Pay:  DME:      Co-Pay:   Medicaid Application Date:       Case Manager:  Disability Application Date:       Case Worker:   Emergency Contact Information        Contact Information    Name Relation Home Work Mobile   [name] Significant other [phone]  [phone]     Current Medical History  Patient Admitting Diagnosis: Debility secondary to DKA and NSTEMI   History of  Present Illness: Dillon Langtry Mooreis a 65 y.o.right handed malewith history of uncontrolled type  2 diabetes mellitus with peripheral neuropathy, CKD-3 baseline creatinine 1.90 -2.00, TIA. Presented 11/15/2015 with progressive shuffling gait.Per chart review patient lives alone. Reportedly independent prior to admission still driving short distances.Patient was found down by significant other and could not speak. Findings of elevated glucose greater than 700.Hemoglobin A1c of 10.MRI of the brain showed no evidence of acute infarct. MRI cervical spine with spondylosis and some canal narrowing from C4-5 through C7-T1 most pronounced at C4-5. Patient received 2 L of normal saline started on insulin infusion per protocol. Hypertensive systolic pressure [id number] as well as tachycardia 120s. Troponin 7.46. Creatinine on admission 3.30 from baseline around 2.00 and presently 3.17 with renal ultrasound showing no obstruction Nephrology consulted forfollow-upin suspect prerenal etiology with hypoperfusion from hyperosmolar diuresis and perhaps ACE inhibitor.There was a cystic mass arising from the upper pole the right kidney measuring 2.5 x 2.8 x 2.2 cm. MRI suggested but could not be completed with contrast due to renal insufficiency and MR abdomen without contrast completed 11/20/2015 showing no identified renal mass. There was some noted renal cysts and recommendations for reimaging in 3-6 monthsEchocardiogram with ejection fraction of 55% no wall motion abnormalities. EEG showed mild diffuse slowing of the electrocerebral activity, no seizure activity. Cardiology consulted for elevated troponin suspect NSTEMIwith Brilintaadded. Plan coronary angiography when patient medically stable and metabolic issues resolve, in the next 2-4 weeks.MBS 11/19/2015 maintained on a regular diet.Physical and occupationaltherapy evaluationscompleted 11/18/2015 with recommendations of physical medicine rehabilitation  consult.Patient was admitted for a comprehensive rehabilitation program    Past Medical History      Past Medical History:  Diagnosis Date  . BPH (benign prostatic hyperplasia)   . Chronic kidney disease   . Depression   . Diabetes mellitus without complication ([place])   . GERD (gastroesophageal reflux disease)   . Hyperlipidemia   . Hypertension   . Hypothyroidism   . TIA (transient ischemic attack)     Family History  family history includes Alcohol abuse in his father; CAD in his mother; CVA in his mother; Diabetes in his father and mother; Hyperlipidemia in his father and mother; Hypertension in his mother.  Prior Rehab/Hospitalizations:  Has the patient had major surgery during 100 days prior to admission? No  Current Medications   Current Facility-Administered Medications:  .  acetaminophen (TYLENOL) tablet 650 mg, 650 mg, Oral, Q6H PRN, [place], MD, 650 mg at 11/19/15 1626 .  aspirin EC tablet 81 mg, 81 mg, Oral, Daily, Neldon Labella, NP, 81 mg at 11/23/15 0847 .  bisacodyl (DULCOLAX) suppository 10 mg, 10 mg, Rectal, Daily PRN, Maryann Mikhail, DO .  carvedilol (COREG) tablet 12.5 mg, 12.5 mg, Oral, BID WC, Rogue Bussing, MD, 12.5 mg at 11/23/15 0902 .  Darbepoetin Alfa (ARANESP) injection 100 mcg, 100 mcg, Subcutaneous, Q Fri-1800, Rogue Bussing, MD, 100 mcg at 11/20/15 1808 .  donepezil (ARICEPT) tablet 10 mg, 10 mg, Oral, QHS, Javier Glazier, MD, 10 mg at 11/22/15 2233 .  feeding supplement (NEPRO CARB STEADY) liquid 237 mL, 237 mL, Oral, BID BM, Rogue Bussing, MD, 237 mL at 11/23/15 1400 .  folic acid (FOLVITE) tablet 1 mg, 1 mg, Oral, Daily, Javier Glazier, MD, 1 mg at 11/23/15 0847 .  heparin injection 5,000 Units, 5,000 Units, Subcutaneous, Q8H, Marijean Heath, NP, 5,000 Units at 11/22/15 1344 .  hydrALAZINE (APRESOLINE) injection 10 mg, 10 mg, Intravenous, Q4H PRN, Rhetta Mura Schorr, NP, 10  mg at 11/22/15 1613 .  HYDROcodone-acetaminophen (NORCO/VICODIN) 5-325 MG per tablet 1 tablet, 1 tablet, Oral, Q6H PRN, Cristal Ford, DO, 1 tablet at 11/23/15 0848 .  insulin aspart (novoLOG) injection 0-5 Units, 0-5 Units, Subcutaneous, QHS, Cherene Altes, MD, 2 Units at 11/22/15 2234 .  insulin aspart (novoLOG) injection 0-9 Units, 0-9 Units, Subcutaneous, TID WC, Cherene Altes, MD, 5 Units at 11/23/15 1250 .  insulin aspart (novoLOG) injection 4 Units, 4 Units, Subcutaneous, TID WC, Maryann Mikhail, DO .  insulin glargine (LANTUS) injection 10 Units, 10 Units, Subcutaneous, BID, Cherene Altes, MD, 10 Units at 11/23/15 0902 .  labetalol (NORMODYNE,TRANDATE) injection 10 mg, 10 mg, Intravenous, Q2H PRN, Jeryl Columbia, NP .  levothyroxine (SYNTHROID, LEVOTHROID) tablet 25 mcg, 25 mcg, Oral, QAC breakfast, Maryann Mikhail, DO, 25 mcg at 11/23/15 0846 .  montelukast (SINGULAIR) tablet 10 mg, 10 mg, Oral, QHS, Cherene Altes, MD, 10 mg at 11/22/15 2233 .  ondansetron (ZOFRAN) injection 4 mg, 4 mg, Intravenous, Q6H PRN, Lily Kocher, MD, 4 mg at 11/17/15 0232 .  pantoprazole (PROTONIX) EC tablet 40 mg, 40 mg, Oral, Daily, Elmarie Shiley, MD, 40 mg at 11/23/15 0855 .  rosuvastatin (CRESTOR) tablet 20 mg, 20 mg, Oral, q1800, Adrian Prows, MD, 20 mg at 11/22/15 1732 .  thiamine (VITAMIN B-1) tablet 100 mg, 100 mg, Oral, Daily, Javier Glazier, MD, 100 mg at 11/23/15 0846 .  ticagrelor (BRILINTA) tablet 90 mg, 90 mg, Oral, BID, Adrian Prows, MD, 90 mg at 11/23/15 0847 .  torsemide (DEMADEX) tablet 20 mg, 20 mg, Oral, Daily, Rogue Bussing, MD, 20 mg at 11/23/15 0848  Patients Current Diet: Diet Carb Modified Fluid consistency: Thin; Room service appropriate? Yes  Precautions / Restrictions Precautions Precautions: Fall Restrictions Weight Bearing Restrictions: No   Has the patient had 2 or more falls or a fall with injury in the past year?No just the fall that lead to this  admission   Prior Activity Level Community (5-7x/wk): Prior to admission patient was independent.  He relocated here from [place] about 4 years ago to be with his significant other.  He drove short distances and lived alone prior to admission per his report.  However, his significant other has been working on moving into the home and now reports that she has and they live together.  Patient reports that prior to admission he was supposed to use a walker and eat better; this has been a wake up call for him and needing to take better care of himself.      Home Assistive Devices / Equipment Home Assistive Devices/Equipment: None Home Equipment: Walker - 4 wheels, [place] - single point  Prior Device Use: Indicate devices/aids used by the patient prior to current illness, exacerbation or injury? None, but was supposed to use a walker  Prior Functional Level Prior Function Level of Independence: Independent Comments: Drives. Reports that he speaks with girlfriend on the phone daily. Independent with ADL but does have difficulty getting off of low toilet.  Self Care: Did the patient need help bathing, dressing, using the toilet or eating?  Independent  Indoor Mobility: Did the patient need assistance with walking from room to room (with or without device)? Independent  Stairs: Did the patient need assistance with internal or external stairs (with or without device)? Independent  Functional Cognition: Did the patient need help planning regular tasks such as shopping or remembering to take medications? Independent  Current Functional Level Cognition Overall Cognitive Status: No family/caregiver present to  determine baseline cognitive functioning (questionable historian.  ) Orientation Level: Oriented X4    Extremity Assessment (includes Sensation/Coordination) Upper Extremity Assessment: Defer to OT evaluation RUE Deficits / Details: Full ROM. Slightly decreased strength, grossly  4/5. LUE Deficits / Details: Pt with full ROM but limited assessment of strength due to L sided pain. LUE: Unable to fully assess due to pain  Lower Extremity Assessment: Generalized weakness, RLE deficits/detail, LLE deficits/detail RLE Deficits / Details: hip flex 4/5, knee ext 4/5, knee flex 4/5 RLE Coordination: decreased gross motor LLE Deficits / Details: difficult to assess due to painful left side with LE motion. hip flex 2+/5, knee ext 3/5, knee flex 3/5 LLE: Unable to fully assess due to pain LLE Coordination: decreased gross motor   ADLs Overall ADL's : Needs assistance/impaired Eating/Feeding: Set up, Sitting Grooming: Wash/dry hands, Wash/dry face, Set up Upper Body Bathing: Set up, Sitting Lower Body Bathing: Moderate assistance, Sit to/from stand Upper Body Dressing : Supervision/safety Lower Body Dressing: Supervision/safety Lower Body Dressing Details (indicate cue type and reason): Limited by pain in L side. Toilet Transfer: Maximal assistance, RW, Buyer, retail Details (indicate cue type and reason): Simulated by sit to stand from chair with functional mobility. Functional mobility during ADLs: Minimal assistance, Rolling walker General ADL Comments: Pt needing mod assist for sit to stand from the bed secondary to weakness and pain.  Still reporting increased pain in the left side and LLE with transitional movements.  Max assist for supine to sit EOB as well.  Pt needs increased time to prepare for all functional movements.     Mobility Overal bed mobility: Needs Assistance Bed Mobility: Supine to Sit Supine to sit: Max assist General bed mobility comments: Min assist for moving the LLE to the EOB with max assist for lifting trunk into sitting position.    Transfers Overall transfer level: Needs assistance Equipment used: Rolling walker (2 wheeled) Transfers: Sit to/from Stand Sit to Stand: Mod assist General transfer comment: Mod assist for sit to stand  from the bed and from the [facility]    Ambulation / Gait / Stairs / Wheelchair Mobility Ambulation/Gait Ambulation/Gait assistance: Museum/gallery curator (Feet): 30 Feet (SHOB noted during gait SPO2 97% on RA HR 109 bpm.  ) Assistive device: 4-wheeled walker Gait Pattern/deviations: Trunk flexed, Decreased stride length, Step-to pattern General Gait Details: Pt refused to unlock rollator and stepped to in pattern due to pain.  Pt required cues for upright posture and increasing stride length. Gait velocity: decreased Gait velocity interpretation: Below normal speed for age/gender   Posture / Balance Balance Overall balance assessment: Needs assistance Sitting-balance support: Feet supported Sitting balance-Leahy Scale: Fair Standing balance support: Bilateral upper extremity supported Standing balance-Leahy Scale: Poor Standing balance comment: Pt needs use of the RW for support in standing.    Special needs/care consideration BiPAP/CPAP: No CPM: No Continuous Drip IV: no Dialysis: No no  Life Vest: No Oxygen: No Special Bed: No Trach Size: No Wound Vac (area): No       Skin: Left upper extremity, skin tear; MASD buttocks, coccyx and sacrum                                  Bowel mgmt: last BM 11/22/15, continent Bladder mgmt: Intermittent incontinence  Diabetic mgmt: Yes prior to admission was testing blood sugar x2 daily and taking Novolog and [place]     Previous Home  Environment Living Arrangements: Alone Available Help at Discharge: Family, Friend(s), Available PRN/intermittently Type of Home: House Home Layout: One level Home Access: Stairs to enter Technical brewer of Steps: 3 Bathroom Shower/Tub: Multimedia programmer: [place]: No Additional Comments: pt reports that he is supposed to walk with his cane or rollator but that he doesn't  Discharge Living Setting  Plans for Discharge Living Setting: Patient's home, Lives with  (comment) (significant other, Santiago Glad ) Type of Home at Discharge: House Discharge Home Layout: One level Discharge Home Access: Stairs to enter Entrance Stairs-Rails: None Entrance Stairs-Number of Steps: 2 Discharge Bathroom Shower/Tub: Walk-in shower (with step over ledge) Discharge Bathroom Toilet: Standard Discharge Bathroom Accessibility: Yes How Accessible: Accessible via walker Does the patient have any problems obtaining your medications?: No  Social/Family/Support Systems Patient Roles: Other (Comment) (significant other ) Contact Information: Gertie Baron [phone] Anticipated Caregiver: Santiago Glad Anticipated Caregiver's Contact Information: see above  Ability/Limitations of Caregiver: None Caregiver Availability: 24/7 Discharge Plan Discussed with Primary Caregiver: Yes Is Caregiver In Agreement with Plan?: Yes Does Caregiver/Family have Issues with Lodging/Transportation while Pt is in Rehab?: No  Goals/Additional Needs Patient/Family Goal for Rehab: PT Mod I; OT Sueprvision-Mod I; SLP Mod I  Expected length of stay: 10-14 days Cultural Considerations: None Dietary Needs: Carb Mod Equipment Needs: TBD Special Service Needs: Renal following  Additional Information: None Pt/Family Agrees to Admission and willing to participate: Yes Program Orientation Provided & Reviewed with Pt/Caregiver Including Roles  & Responsibilities: Yes Additional Information Needs: None Information Needs to be Provided By: N/A  Decrease burden of Care through IP rehab admission: No  Possible need for SNF placement upon discharge: No  Patient Condition: This patient's medical and functional status has changed since the consult dated: 11/18/15 in which the Rehabilitation Physician determined and documented that the patient's condition is appropriate for intensive rehabilitative care in an inpatient rehabilitation facility. See "History of Present Illness" (above) for medical update.  Functional changes are: min assist bed mobility, mod assist transfers, min assist 30 with RW. Patient's medical and functional status update has been discussed with the Rehabilitation physician and patient remains appropriate for inpatient rehabilitation. Will admit to inpatient rehab today.  Preadmission Screen Completed By:  Gunnar Fusi, with updates by Gerlean Ren  11/23/2015 2:44 PM ______________________________________________________________________   Discussed status with Dr.  Posey Pronto on 11/23/15 at  1444  and received telephone approval for admission today.  Admission Coordinator:  Gerlean Ren, time [id number] /Date 11/23/15       Cosigned by: Ankit Lorie Phenix, MD at 11/23/2015 3:02 PM  Revision History

## 2015-11-23 NOTE — Progress Notes (Signed)
[facility] Progress Note   Background: Dillon Thomas is an 65 y.o. male with PMH of uncontrolled type 2 diabetes mellitus with peripheral neuropathy, CKD-3 baseline creatinine 2.00 (last noted in Feb of 2017-1.9), HTN, BPH, hypothyroidism, depression and TIA. He presented to the hospital after being found down and with AMS; in [facility] with CBG > 800 on admission. Brain imaging normal. Elevated troponin, thought to be NSTEMI per Cards. Creatinine 3.48 on admission.   Assessment/ Plan:   1. Acute on Chronic Renal Failure: Suspect this is A on CRF in the setting of volume depletion on ACE with baseline diabetic nephropathy. However, given possibly nephrotic range protein and hematuria obtained more labs to rule out GN. Last SCr 1.90 in February, so elevated SCr could also represent chronic worsening in the setting of diabetic nephropathy with poorly controlled illnesses. Initial UA with 6-30 RBC per HPF; repeat 11/2 with TNC RBCs and granular casts. CK WNL. Spot urine protein 760 mg/dL. FENa 1.1%, consistent with intrinsic disease. Albumin 1.5 (was 1.9 on admission), which may represent poor nutritional status vs nephrotic syndrome /intrinsic kidney injury. Currently on Demedax 20 daily. Serologies: ANA, kappa/lambda light chains in process. ANCA titers pending but antibodies not elevated. C3, C4,  HIV, hepatitis panel negative. UOP down but incompletely charted. Continue torsemide. Will need to establish with an Nephrologist outpatient. Consider 24 hour urine protein collection outpatient. Believe he may be at his new baseline. He is appropriate for transfer to CIR. Nephrology will continue to follow.  2. HTN: Improved with occasional highs. Increased carvedilol to 12.5 BID 11/2. Continue torsemide daily.  3. Anemia: Hgb as high as 12.5 on 02/11/15 via [place]. Agree with iron studies, which showed severe deficiency. s/p feraheme 11/2, to repeat dose today, 11/6. Will start aranesp  weekly, as well. Received 1 u pRBCs 11/5 for hgb to 7.3. Improved to 8.0 despite blood loss at IV sites; heparin held overnight. No signs of acute bleed.  4. Renal cysts: Nonobstructive. MRI showed complex renal cortical mass with mural nodule, concerning for renal cell carcinoma; will need further imaging with IV contrast. Would benefit from Urology consultation in the near future.  5. Elevated troponin: Concern for NSTEMI. Cardiology following. Agree with abstaining from contrast for cath for the time being. 6. Diabetes: Per primary team. Hgb A1c 10.0, worse from hgb A1c 8.1 07/16/15. Home regimen of tradjenta and novolog 70/30 80 U with breakfast, 72 U with dinner. Improved CBGs on 10 U lantus BID, meal coverage and SSI.  Subjective:   Patient had bleeding with attempts at transfusion yesterday. S/p 1 u pRBCs. IV site oozing. Heparin stopped. Still with left-sided back pain.    Objective:   BP (!) 169/67 (BP Location: Right Arm)   Pulse 77   Temp 97.8 F (36.6 C) (Oral)   Resp 18   Ht 5\' 11"  (1.803 m)   Wt 223 lb 8.7 oz (101.4 kg)   SpO2 98%   BMI 31.18 kg/m   Intake/Output Summary (Last 24 hours) at 11/23/15 0748 Last data filed at 11/23/15 0710  Gross per 24 hour  Intake              784 ml  Output              700 ml  Net               84 ml   Net postive 0.6 L since admission.   Weight change:  Physical Exam: General: Overweight male, sitting up in recliner Cardiac: RRR, S1, S2, no m/r/g Lungs: CTAB, no increased WOB,  MSK: 1+ pitting edema to thighs; mild TTP over L CVA and midline over thoracic spine  Neuro: No asterixis Neuro: AOx3, moves all extremities spontaneously  Imaging: US Renal  Result Date: 11/19/2015 CLINICAL DATA:  Acute renal insufficiency EXAM: RENAL / URINARY TRACT ULTRASOUND COMPLETE COMPARISON:  None. FINDINGS: Right Kidney: Length: 12.2 cm. Echogenicity is increased. Renal cortical thickness is normal. No perinephric fluid or hydronephrosis  visualized. There is a cystic mass arising from the upper pole of the right kidney measuring 2.5 x 2.8 x 2.2 cm. Within this cystic mass, there is a solid-appearing focus measuring 1.4 x 1.2 x 1.2 cm. There is no sonographically demonstrable calculus or ureterectasis. Left Kidney: Length: 11.6 cm. Echogenicity is increased. Renal cortical thickness is normal. No perinephric fluid or hydronephrosis visualized. There is a cyst arising from the left kidney measuring 3.0 x 2.6 x 2.8 cm. No sonographically demonstrable calculus or ureterectasis. Bladder: Appears normal for degree of bladder distention. IMPRESSION: Kidneys are echogenic consistent with medical renal disease. No obstructing focus identified on either side. There is a cystic lesion arising from the upper pole of the right kidney which contains a solid focus. This appearance raises concern for potential renal neoplasm with a cystic component. Further evaluation with pre and post contrast MRI or CT should be considered. MRI is preferred in younger patients (due to lack of ionizing radiation) and for evaluating calcified lesion(s). Given the elevated creatinine, MRI may be a better choice for further assessment if there is no contraindication to MR imaging. There is a simple cyst in the left kidney. These results will be called to the ordering clinician or representative by the Radiologist Assistant, and communication documented in the PACS or zVision Dashboard. Electronically Signed   By: Lowella Grip III M.D.   On: 11/19/2015 14:57       Recent Labs Lab 11/17/15 0220 11/18/15 0404 11/19/15 0737 11/20/15 0326 11/21/15 0542 11/22/15 0510 11/23/15 0601  NA 138 135 134* 135 138 137 138  K 3.5 3.8 3.6 3.6 3.5 3.7 3.9  CL 114* 111 109 108 111 108 109  CO2 17* 16* 18* 20* 20* 21* 19*  GLUCOSE 193* 229* 90 87 31* 177* 118*  BUN 37* 41* 43* 50* 51* 51* 56*  CREATININE 3.13* 3.20* 3.17* 3.44* 3.49* 3.60* 3.63*  CALCIUM 8.4* 8.5* 8.4* 8.3* 8.3*  7.9* 8.2*  PHOS 4.9* 5.1* 4.7*  --   --   --   --      Recent Labs Lab 11/17/15 0220  11/18/15 0404 11/19/15 0737 11/20/15 0326 11/22/15 0510 11/23/15 0601  WBC 11.5*  --  11.3* 9.2 8.9 9.5 10.8*  NEUTROABS 6.6  --  6.4  --   --   --   --   HGB 7.6*  < > 7.7* 7.8* 7.7* 7.3* 8.0*  HCT 23.4*  < > 24.0* 24.5* 23.8* 23.1* 25.4*  MCV 86.7  --  87.0 86.9 86.5 87.5 89.1  PLT 299  --  337 369 352 422* 445*  < > = values in this interval not displayed.  Medications:    . aspirin EC  81 mg Oral Daily  . carvedilol  12.5 mg Oral BID WC  . darbepoetin (ARANESP) injection - NON-DIALYSIS  100 mcg Subcutaneous Q Fri-1800  . donepezil  10 mg Oral QHS  . feeding supplement (NEPRO CARB STEADY)  237 mL Oral BID BM  . ferumoxytol  510 mg Intravenous Weekly  . folic acid  1 mg Oral Daily  . heparin subcutaneous  5,000 Units Subcutaneous Q8H  . insulin aspart  0-5 Units Subcutaneous QHS  . insulin aspart  0-9 Units Subcutaneous TID WC  . insulin aspart  3 Units Subcutaneous TID WC  . insulin glargine  10 Units Subcutaneous BID  . levothyroxine  25 mcg Oral QAC breakfast  . montelukast  10 mg Oral QHS  . pantoprazole  40 mg Oral Daily  . rosuvastatin  20 mg Oral q1800  . thiamine  100 mg Oral Daily  . ticagrelor  90 mg Oral BID  . torsemide  20 mg Oral Daily   Olene Floss, MD [place], PGY-2 11/23/2015, 7:48 AM   I have seen and examined this patient and agree with plan and assessment in the above note with renal recommendations/intervention highlighted. Creatinine "stuck" at about 3.6, volume acceptable. Redosing Feraheme and starting Aranesp for Hb of 8.  Pt is OK for transfer to rehab and we will continue to follow there. Will figure out his outpt Nephrology f/u - currently has no nephrologist but is willing to come to be followed in [place].   Lache Dagher B,MD 11/23/2015 5:08 PM

## 2015-11-23 NOTE — Care Management Note (Signed)
Case Management Note  Patient Details  Name: Dillon Thomas MRN: [id number] Date of Birth: 07/04/1950  Subjective/Objective:                 Admitted with metabolic encephalopathy.   Action/Plan:  DC to CIR today.   Expected Discharge Date:                  Expected Discharge Plan:  [place]  In-House Referral:  NA  Discharge planning Services  CM Consult  Post Acute Care Choice:  NA Choice offered to:  NA  DME Arranged:    DME Agency:     HH Arranged:    HH Agency:     Status of Service:  Completed, signed off  If discussed at [employer] of Stay Meetings, dates discussed:    Additional Comments:  Carles Collet, RN 11/23/2015, 3:15 PM

## 2015-11-23 NOTE — Progress Notes (Signed)
Inpatient Diabetes Program Recommendations  AACE/ADA: New Consensus Statement on Inpatient Glycemic Control (2015)  Target Ranges:  Prepandial:   less than 140 mg/dL      Peak postprandial:   less than 180 mg/dL (1-2 hours)      Critically ill patients:  140 - 180 mg/dL   Lab Results  Component Value Date   GLUCAP 277 (H) 11/23/2015   HGBA1C 10.0 (H) 11/16/2015    Review of Glycemic Control Results for Dillon Thomas, Dillon Thomas (MRN [id number]) as of 11/23/2015 13:28  Ref. Range 11/22/2015 08:09 11/22/2015 12:32 11/22/2015 17:11 11/22/2015 21:36 11/23/2015 08:06 11/23/2015 11:58  Glucose-Capillary Latest Ref Range: 65 - 99 mg/dL 155 (H) 198 (H) 326 (H) 239 (H) 124 (H) 277 (H)   Inpatient Diabetes Program Recommendations:   Noted elevated postprandial CBGs. Please consider increase in meal coverage to Novolog 4 units tid if eats 50%.  Thank you, Dillon Thomas. Dillon Blanks, RN, MSN, CDE Inpatient Glycemic Control Team Team Pager [phone] (8am-5pm) 11/23/2015 1:29 PM

## 2015-11-24 ENCOUNTER — Inpatient Hospital Stay (HOSPITAL_COMMUNITY): Payer: MEDICARE

## 2015-11-24 ENCOUNTER — Inpatient Hospital Stay (HOSPITAL_COMMUNITY): Payer: MEDICARE | Admitting: Occupational Therapy

## 2015-11-24 ENCOUNTER — Inpatient Hospital Stay (HOSPITAL_COMMUNITY): Payer: PRIVATE HEALTH INSURANCE | Admitting: Physical Therapy

## 2015-11-24 DIAGNOSIS — D62 Acute posthemorrhagic anemia: Secondary | ICD-10-CM

## 2015-11-24 DIAGNOSIS — R5381 Other malaise: Principal | ICD-10-CM

## 2015-11-24 DIAGNOSIS — I214 Non-ST elevation (NSTEMI) myocardial infarction: Secondary | ICD-10-CM

## 2015-11-24 DIAGNOSIS — N183 Chronic kidney disease, stage 3 unspecified: Secondary | ICD-10-CM

## 2015-11-24 DIAGNOSIS — E1165 Type 2 diabetes mellitus with hyperglycemia: Secondary | ICD-10-CM

## 2015-11-24 DIAGNOSIS — I10 Essential (primary) hypertension: Secondary | ICD-10-CM

## 2015-11-24 DIAGNOSIS — R0781 Pleurodynia: Secondary | ICD-10-CM

## 2015-11-24 DIAGNOSIS — E1142 Type 2 diabetes mellitus with diabetic polyneuropathy: Secondary | ICD-10-CM

## 2015-11-24 DIAGNOSIS — D638 Anemia in other chronic diseases classified elsewhere: Secondary | ICD-10-CM

## 2015-11-24 DIAGNOSIS — D72820 Lymphocytosis (symptomatic): Secondary | ICD-10-CM

## 2015-11-24 LAB — GLUCOSE, CAPILLARY
GLUCOSE-CAPILLARY: 117 mg/dL — AB (ref 65–99)
GLUCOSE-CAPILLARY: 211 mg/dL — AB (ref 65–99)
Glucose-Capillary: 202 mg/dL — ABNORMAL HIGH (ref 65–99)
Glucose-Capillary: 47 mg/dL — ABNORMAL LOW (ref 65–99)
Glucose-Capillary: 63 mg/dL — ABNORMAL LOW (ref 65–99)

## 2015-11-24 LAB — RENAL FUNCTION PANEL
ALBUMIN: 1.7 g/dL — AB (ref 3.5–5.0)
ANION GAP: 7 (ref 5–15)
BUN: 57 mg/dL — ABNORMAL HIGH (ref 6–20)
CALCIUM: 8.4 mg/dL — AB (ref 8.9–10.3)
CO2: 21 mmol/L — AB (ref 22–32)
Chloride: 108 mmol/L (ref 101–111)
Creatinine, Ser: 3.55 mg/dL — ABNORMAL HIGH (ref 0.61–1.24)
GFR, EST AFRICAN AMERICAN: 19 mL/min — AB (ref >60)
GFR, EST NON AFRICAN AMERICAN: 17 mL/min — AB (ref >60)
Glucose, Bld: 219 mg/dL — ABNORMAL HIGH (ref 65–99)
PHOSPHORUS: 4.4 mg/dL (ref 2.5–4.6)
Potassium: 4 mmol/L (ref 3.5–5.1)
Sodium: 136 mmol/L (ref 135–145)

## 2015-11-24 LAB — TROPONIN I: Troponin I: 0.25 ng/mL (ref <0.03)

## 2015-11-24 LAB — PREPARE RBC (CROSSMATCH)

## 2015-11-24 MED ORDER — TRAZODONE HCL 50 MG PO TABS
50.0000 mg | ORAL_TABLET | Freq: Every evening | ORAL | Status: DC | PRN
  Administered 2015-11-24: 50 mg via ORAL
  Filled 2015-11-24: qty 1

## 2015-11-24 MED ORDER — SODIUM BICARBONATE 650 MG PO TABS
650.0000 mg | ORAL_TABLET | Freq: Three times a day (TID) | ORAL | Status: DC
  Administered 2015-11-24 – 2015-11-26 (×6): 650 mg via ORAL
  Filled 2015-11-24 (×6): qty 1

## 2015-11-24 MED ORDER — AMLODIPINE BESYLATE 10 MG PO TABS
10.0000 mg | ORAL_TABLET | Freq: Every day | ORAL | Status: DC
  Administered 2015-11-24 – 2015-12-04 (×11): 10 mg via ORAL
  Filled 2015-11-24 (×11): qty 1

## 2015-11-24 MED ORDER — SODIUM CHLORIDE 0.9 % IV SOLN
Freq: Once | INTRAVENOUS | Status: AC
  Administered 2015-11-24: 15:00:00 via INTRAVENOUS

## 2015-11-24 NOTE — Progress Notes (Signed)
I have reviewed his chart. From cardiac standpoint he is stable, serum troponin has come down significantly. His medical issues predominate at this point, given his continued anemia, renal failure, unless he is having chest pain that he's not manageable medically, then on the left consider urgent cardiac catheterization. I will see him in the morning. Continue physical therapy.   Adrian Prows, MD 11/24/2015, [facility]. [place] Pager: [phone] Office: [phone] If no answer: Cell:  [phone]

## 2015-11-24 NOTE — IPOC Note (Signed)
Overall Plan of Care [facility]) Patient Details Name: Dillon Thomas MRN: [id number] DOB: 1950/01/20  Admitting Diagnosis: Debility DKA  Hospital Problems: Principal Problem:   Debility Active Problems:   Debilitated   Stage 3 chronic kidney disease   Essential hypertension   Poorly controlled type 2 diabetes mellitus with peripheral neuropathy ([address])   Rib pain on left side     Functional Problem List: Nursing Behavior, Bladder, Bowel, Endurance, Medication Management, Motor, Nutrition, Pain, Perception, Safety, Sensory, Skin Integrity  PT Balance, Pain, Safety, Endurance  OT Balance, Endurance, Pain, Safety  SLP    TR         Basic ADL's: OT Grooming, Bathing, Dressing, Toileting     Advanced  ADL's: OT Laundry     Transfers: PT Bed Mobility, Bed to Chair, Car, Manufacturing systems engineer, Metallurgist: PT Ambulation, Stairs, Emergency planning/management officer     Additional Impairments: OT None  SLP        TR      Anticipated Outcomes Item Anticipated Outcome  Self Feeding n/a  Swallowing      Basic self-care  mod I  Toileting  mod I    Bathroom Transfers Mod I - toilet, supervision - shower  Bowel/Bladder  mod indip.  Transfers  modI with LRAD  Locomotion  modI with LRAD in home, S stairs and community gait  Communication     Cognition     Pain  less<3  Safety/Judgment  mod inip   Therapy Plan: PT Intensity: Minimum of 1-2 x/day ,45 to 90 minutes PT Frequency: 5 out of 7 days PT Duration Estimated Length of Stay: 10 days OT Intensity: Minimum of 1-2 x/day, 45 to 90 minutes OT Frequency: 5 out of 7 days OT Duration/Estimated Length of Stay: 10 days         Team Interventions: Nursing Interventions Patient/Family Education, Bladder Management, Bowel Management, Disease Management/Prevention, Pain Management, Medication Management, Skin Care/Wound Management, Cognitive Remediation/Compensation, Discharge Planning, Psychosocial Support  PT  interventions Stair training, Therapeutic Exercise, UE/LE Coordination activities, Wheelchair propulsion/positioning, UE/LE Strength taining/ROM, Therapeutic Activities, Psychosocial support, Pain management, Patient/family education, Neuromuscular re-education, Disease management/prevention, Academic librarian, Training and development officer, Ambulation/gait training, Discharge planning, DME/adaptive equipment instruction, Functional mobility training  OT Interventions Training and development officer, Community reintegration, Discharge planning, Functional mobility training, Psychosocial support, Therapeutic Activities, UE/LE Coordination activities, Patient/family education, UE/LE Strength taining/ROM, Pain management, DME/adaptive equipment instruction, Cognitive remediation/compensation, Self Care/advanced ADL retraining, Therapeutic Exercise  SLP Interventions    TR Interventions    SW/CM Interventions Discharge Planning, Psychosocial Support, Patient/Family Education    Team Discharge Planning: Destination: PT-Home ,OT- Home , SLP-  Projected Follow-up: PT-Home health PT, OT-  Home health OT, SLP-  Projected Equipment Needs: PT-To be determined, OT- Tub/shower seat, SLP-  Equipment Details: PT-has RW and SPC, OT-  Patient/family involved in discharge planning: PT- Patient,  OT-Patient, SLP-   MD ELOS: 7-10 days. Medical Rehab Prognosis:  Excellent Assessment:  65 y.o.right handed malewith history of uncontrolled type 2 diabetes mellitus with peripheral neuropathy, CKD-3 baseline creatinine 1.90 - 2.00, TIA. Presented 11/15/2015 with progressive shuffling gait.Per chart review patient lives alone. Reportedly independent prior to admission still driving short distances.Patient was found down by significant other and could not speak. Findings of elevated glucose greater than 700.Hemoglobin A1c of 10.MRI of the brain showed no evidence of acute infarct. MRI cervical spine with spondylosis and  some canal narrowing from C4-5 through C7-T1 most pronounced at C4-5. Patient received 2  L of normal saline started on insulin infusion per protocol. Hypertensive systolic pressure [id number] as well as tachycardia 120s. Troponin 7.46. Creatinine on admission 3.30 from baseline around 2.00 and trending up with renal ultrasound showing no obstruction Nephrology consulted for follow-up in suspect prerenal etiology with hypoperfusion from hyperosmolar diuresis and perhaps ACE inhibitor. There was a cystic mass arising from the upper pole the right kidney measuring 2.5 x 2.8 x 2.2 cm. MRI suggested but could not be completed with contrast due to renal insufficiency and MR abdomen without contrast completed 11/20/2015 showing no identified renal mass. There was some noted renal cysts and recommendations for reimaging in 3-6 months Echocardiogram with ejection fraction of 55% no wall motion abnormalities. EEG showed mild diffuse slowing of the electrocerebral activity, no seizure activity. Cardiology consulted for elevated troponin suspect NSTEMIwith Brilintaadded. Plan coronary angiography when patient medically stable and metabolic issues resolve, in the next 2-4 weeks. MBS 11/19/2015 maintained on a regular diet. Pt with resulting deficits with gait, endurance, balance. Will set goals for Mod I with therapies.     See Team Conference Notes for weekly updates to the plan of care

## 2015-11-24 NOTE — Evaluation (Signed)
Occupational Therapy Assessment and Plan  Patient Details  Name: Dillon Thomas MRN: [id number] Date of Birth: 1950/02/11  OT Diagnosis: acute pain and muscle weakness (generalized) Rehab Potential: Rehab Potential (ACUTE ONLY): Fair ELOS: 10 days   Today's Date: 11/24/2015 OT Individual Time: 1194-1740 and 1300-1353 OT Individual Time Calculation (min): 75 min and 53 min      Problem List: Patient Active Problem List   Diagnosis Date Noted  . Stage 3 chronic kidney disease   . Essential hypertension   . Poorly controlled type 2 diabetes mellitus with peripheral neuropathy ([place])   . Rib pain on left side   . Debility 11/23/2015  . Debilitated 11/23/2015  . Anemia of chronic disease   . Renal mass   . Hyperlipidemia   . Acute blood loss anemia   . Benign essential HTN   . Diabetic ketoacidosis with coma associated with type 2 diabetes mellitus ([place])   . Hemangioma   . History of TIA (transient ischemic attack)   . Hyperglycemia   . Hypothyroidism   . Lymphocytosis   . NSTEMI (non-ST elevated myocardial infarction) ([place])   . Spondylosis of cervical region without myelopathy or radiculopathy   . Uncontrolled type 2 diabetes mellitus with peripheral neuropathy ([place])   . Skin tear of left elbow without complication   . Pressure injury of skin 11/16/2015  . Obtunded   . Right sided weakness   . Altered mental status 11/15/2015  . Acute metabolic encephalopathy 81/44/8185  . AKI (acute kidney injury) ([place]) 11/15/2015  . CKD (chronic kidney disease) stage 3, GFR 30-59 ml/min 11/15/2015  . Anemia 11/15/2015  . Hyponatremia 11/15/2015  . Hyperosmolar (nonketotic) coma ([place]) 11/15/2015  . Elevated troponin 11/15/2015  . Tachycardia 11/15/2015  . Accelerated hypertension 11/15/2015    Past Medical History:  Past Medical History:  Diagnosis Date  . BPH (benign prostatic hyperplasia)   . Chronic kidney disease   . Depression   . Diabetes mellitus without complication ([place])    . GERD (gastroesophageal reflux disease)   . Hyperlipidemia   . Hypertension   . Hypothyroidism   . TIA (transient ischemic attack)    Past Surgical History:  Past Surgical History:  Procedure Laterality Date  . APPENDECTOMY    . CHOLECYSTECTOMY    . CYSTOURETHROSCOPY    . POLYPECTOMY     Vocal cord polyp removed  . VASECTOMY      Assessment & Plan Clinical Impression: Patient is a 65 y.o. year old malewith history of uncontrolled type 2 diabetes mellitus with peripheral neuropathy, CKD-3 baseline creatinine 1.90 - 2.00, TIA. Presented 11/15/2015 with progressive shuffling gait.Per chart review patient lives alone. Reportedly independent prior to admission still driving short distances.Patient was found down by significant other and could not speak. Findings of elevated glucose greater than 700.Hemoglobin A1c of 10.MRI of the brain showed no evidence of acute infarct. MRI cervical spine with spondylosis and some canal narrowing from C4-5 through C7-T1 most pronounced at C4-5. Patient received 2 L of normal saline started on insulin infusion per protocol. Hypertensive systolic pressure 631-497 as well as tachycardia 120s. Troponin 7.46. Creatinine on admission 3.30 from baseline around 2.00 and presently 3.17 with renal ultrasound showing no obstruction Nephrology consulted for follow-up in suspect prerenal etiology with hypoperfusion from hyperosmolar diuresis and perhaps ACE inhibitor. There was a cystic mass arising from the upper pole the right kidney measuring 2.5 x 2.8 x 2.2 cm. MRI suggested but could not be completed with contrast  due to renal insufficiency and MR abdomen without contrast completed 11/20/2015 showing no identified renal mass. There was some noted renal cysts and recommendations for reimaging in 3-6 months Echocardiogram with ejection fraction of 55% no wall motion abnormalities. EEG showed mild diffuse slowing of the electrocerebral activity, no seizure activity.  Cardiology consulted for elevated troponin suspect NSTEMIwith Brilintaadded. Plan coronary angiography when patient medically stable and metabolic issues resolve, in the next 2-4 weeks.MBS 11/19/2015 maintained on a regular diet. Physical and occupational therapy evaluations completed 11/18/2015 with recommendations of physical medicine rehabilitation consult.Patient was admitted for a comprehensive rehabilitation program.  Patient transferred to CIR on 11/23/2015 .    Patient currently requires mod with basic self-care skills secondary to muscle weakness, decreased cardiorespiratoy endurance and decreased sitting balance, decreased standing balance and decreased balance strategies.  Prior to hospitalization, patient could complete ADLs with modified independent .  Patient will benefit from skilled intervention to increase independence with basic self-care skills prior to discharge home with care partner.  Anticipate patient will require intermittent supervision and follow up home health.  OT - End of Session Activity Tolerance: Decreased this session Endurance Deficit: Yes Endurance Deficit Description: requires frequent rest breaks, increased time to complete all tasks d/t fatigue/pain OT Assessment Rehab Potential (ACUTE ONLY): Fair OT Patient demonstrates impairments in the following area(s): Balance;Endurance;Pain;Safety OT Basic ADL's Functional Problem(s): Grooming;Bathing;Dressing;Toileting OT Advanced ADL's Functional Problem(s): Laundry OT Transfers Functional Problem(s): Toilet;Tub/Shower OT Additional Impairment(s): None OT Plan OT Intensity: Minimum of 1-2 x/day, 45 to 90 minutes OT Frequency: 5 out of 7 days OT Duration/Estimated Length of Stay: 10 days OT Treatment/Interventions: Medical illustrator training;Community reintegration;Discharge planning;Functional mobility training;Psychosocial support;Therapeutic Activities;UE/LE Coordination activities;Patient/family  education;UE/LE Strength taining/ROM;Pain management;DME/adaptive equipment instruction;Cognitive remediation/compensation;Self Care/advanced ADL retraining;Therapeutic Exercise OT Self Feeding Anticipated Outcome(s): n/a OT Basic Self-Care Anticipated Outcome(s): mod I OT Toileting Anticipated Outcome(s): mod I  OT Bathroom Transfers Anticipated Outcome(s): Mod I - toilet, supervision - shower OT Recommendation Patient destination: Home Follow Up Recommendations: Home health OT Equipment Recommended: Tub/shower seat   Skilled Therapeutic Intervention Session 1: OT educating pt on OT purpose, POC, and goals with pt verbalizing understanding and agreement. Pt engaged in bathing and dressing while seated on EOB as he refused to exit bed and shower. Pt performed sit <>stand from elevated bed height with steady assistance to pull up pants. Pt needing assistance to thread onto B LEs. Pt with decreased endurance and taking many breaks throughout session. Pt returning to bed at end of session. Call bell and all needed items within reach.   Session 2: Upon entering the room, pt seated in wheelchair with no c/o pain. OT initiated B UE theraband HEP for B UE strengthening. OT educated and performed demonstration with use of orange, level 2 theraband. Pt demonstrated exercises back x 10 reps each per paper handout. Pt wrapping band around IV and IV coming out during session. RN notified immediately and Pt returned to bed. Call bell and all needed items within reach.  OT Evaluation Precautions/Restrictions  Precautions Precautions: Fall Restrictions Weight Bearing Restrictions: No General   Vital Signs Therapy Vitals Temp: 98.2 F (36.8 C) Temp Source: Oral Pulse Rate: 74 Resp: 18 BP: (!) 142/58 Oxygen Therapy SpO2: 100 % O2 Device: Not Delivered Pain Pain Assessment Pain Assessment: 0-10 Pain Score: 5  Pain Type: Acute pain Pain Location: Rib cage Pain Orientation: Left Pain Descriptors  / Indicators: Aching Pain Onset: On-going Patients Stated Pain Goal: 2 Pain Intervention(s): Repositioned Multiple Pain Sites: No  Home Living/Prior Functioning Home Living Available Help at Discharge: Available PRN/intermittently, Other (Comment) Type of Home: House Home Access: Stairs to enter [employer] of Steps: 3 Entrance Stairs-Rails: Can reach both, Left, Right Home Layout: One level Bathroom Shower/Tub: Multimedia programmer: Standard Additional Comments: pt reports that he is supposed to walk with his cane or rollator but that he doesn't. He plans to marry his girlfriend and she will be staying with him 24/7.  Lives With: Alone Prior Function Level of Independence: Requires assistive device for independence, Independent with gait, Independent with homemaking with ambulation, Independent with transfers, Independent with basic ADLs  Able to Take Stairs?: Yes Driving: Yes Vocation: Retired Leisure: Hobbies-yes (Comment) Comments: Watching his girlfriends dogs, yard work, running errands ADL   Vision/Perception  Vision- History Baseline Vision/History: Cataracts Patient Visual Report: No change from baseline Vision- Assessment Vision Assessment?: No apparent visual deficits Perception Comments: WFL  Cognition Overall Cognitive Status: No family/caregiver present to determine baseline cognitive functioning Arousal/Alertness: Awake/alert Orientation Level: Person;Place;Situation Person: Oriented Place: Oriented Situation: Oriented Year: 2017 Month: October Day of Week: Correct Memory: Impaired Memory Impairment: Decreased recall of new information;Decreased short term memory Decreased Short Term Memory: Verbal basic Immediate Memory Recall: Sock;Blue;Bed Memory Recall: Bed (1/3) Memory Recall Bed: Without Cue Awareness: Appears intact Problem Solving: Appears intact Behaviors: Poor frustration tolerance Safety/Judgment: Appears  intact Sensation Sensation Light Touch: Appears Intact Stereognosis: Not tested Hot/Cold: Not tested Proprioception: Appears Intact Coordination Fine Motor Movements are Fluid and Coordinated: No Heel Shin Test: Limited speed/excursion d/t strength deficits Motor  Motor Motor - Skilled Clinical Observations: generalized weakness, slow initiation d/t pain Mobility  Bed Mobility Bed Mobility: Supine to Sit Supine to Sit: 5: Supervision;With rails;HOB elevated;Other (comment) Supine to Sit Details: Verbal cues for technique;Verbal cues for precautions/safety  Trunk/Postural Assessment  Cervical Assessment Cervical Assessment: Within Functional Limits Thoracic Assessment Thoracic Assessment: Within Functional Limits Lumbar Assessment Lumbar Assessment: Within Functional Limits Postural Control Postural Control: Within Functional Limits  Balance Balance Balance Assessed: Yes Static Sitting Balance Static Sitting - Balance Support: Right upper extremity supported Static Sitting - Level of Assistance: 5: Stand by assistance Dynamic Sitting Balance Dynamic Sitting - Balance Support: Right upper extremity supported Dynamic Sitting - Level of Assistance: 5: Stand by assistance Dynamic Sitting - Balance Activities: Lateral lean/weight shifting;Forward lean/weight shifting;Reaching across midline Static Standing Balance Static Standing - Balance Support: Bilateral upper extremity supported Static Standing - Level of Assistance: 5: Stand by assistance Dynamic Standing Balance Dynamic Standing - Balance Support: Bilateral upper extremity supported;During functional activity Dynamic Standing - Level of Assistance: 4: Min assist Dynamic Standing - Balance Activities: Lateral lean/weight shifting;Forward lean/weight shifting;Reaching across midline Extremity/Trunk Assessment RUE Assessment RUE Assessment: Exceptions to [facility] (3+/5) LUE Assessment LUE Assessment: Exceptions to [facility]  (3-/5)   See Function Navigator for Current Functional Status.   Refer to Care Plan for Long Term Goals  Recommendations for other services: None  Discharge Criteria: Patient will be discharged from OT if patient refuses treatment 3 consecutive times without medical reason, if treatment goals not met, if there is a change in medical status, if patient makes no progress towards goals or if patient is discharged from hospital.  The above assessment, treatment plan, treatment alternatives and goals were discussed and mutually agreed upon: by patient  [name] 11/24/2015, 5:33 PM

## 2015-11-24 NOTE — Progress Notes (Addendum)
Dillon Thomas Progress Note   Background: Dillon Thomas is an 65 y.o. male with PMH of uncontrolled type 2 diabetes mellitus with peripheral neuropathy, CKD-3 baseline creatinine 2.00 (last noted in Feb of 2017-1.9), HTN, BPH, hypothyroidism, depression and TIA. He presented to the hospital after being found down and with AMS; in [place] with CBG > 800 on admission. Brain imaging normal. Elevated troponin, thought to be NSTEMI per Cards. Creatinine 3.48 on admission.   Assessment/ Plan:     1. Acute on Chronic Renal Failure: Suspect this is A on CRF in the setting of volume depletion on ACE with baseline diabetic nephropathy. However, given possibly nephrotic range protein and hematuria obtained more labs to rule out GN. Last SCr 1.90 in February, so elevated SCr could also represent chronic worsening in the setting of diabetic nephropathy with poorly controlled illnesses. Initial UA with 6-30 RBC per HPF; repeat 11/2 with TNC RBCs and granular casts. CK WNL. Spot urine protein 760 mg/dL. FENa 1.1%, consistent with intrinsic disease. Albumin 1.5 (was 1.9 on admission), which may represent poor nutritional status vs nephrotic syndrome /intrinsic kidney injury. Currently on Demedax 20 daily. Serologies: ANA, kappa/lambda light chains in process. ANCA titers pending but antibodies not elevated. C3, C4,  HIV, hepatitis panel negative. UOP adequate at 1.5 L but incompletely charted. Continue torsemide. Will need to establish with an Nephrologist outpatient. Consider 24 hour urine protein collection outpatient. Believe he may be at his new baseline. Nephrology will continue to follow.  2. HTN: Improved with occasional highs. Increased carvedilol to 12.5 BID 11/2. Will add norvasc 10 mg. Continue torsemide 20 mg daily.   3. Anemia: Hgb as high as 12.5 on 02/11/15 via [place]. Iron studies showed severe deficiency. s/p feraheme 11/2, with repeat dose 11/6. Aranesp weekly, as well. Received 1  u pRBCs 11/5 for hgb to 7.3. Improved to 8.0 despite blood loss at IV sites. No signs of acute bleed. Will order another 1 unit transfusion today. Discontinue heparin and SCDs ordered. Continue to monitor. FOBT ordered.   4. Chest Wall Pain: Will order 2V xray today due assess for rib fractures and because had crackles on exam. Will order incentive spirometry.   5. Renal cysts: Nonobstructive. MRI showed complex renal cortical mass with mural nodule, concerning for renal cell carcinoma; will need further imaging with IV contrast. Would benefit from Urology consultation in the near future.   6. Elevated troponin: Concern for NSTEMI. Cardiology following. Agree with abstaining from contrast for cath for the time being.  7. Diabetes: Per primary team. Hgb A1c 10.0, worse from hgb A1c 8.1 07/16/15. Home regimen of tradjenta and novolog 70/30 80 U with breakfast, 72 U with dinner. Improved CBGs on 10 U lantus BID, meal coverage and SSI.  I have seen and examined this patient and agree with plan and assessment in the above note by Dr. Ola Spurr with renal recommendations/intervention highlighted. Creatinine stable at around 3.6. Bicarb low at 19 so will start oral sodium bicarbonate. Transfuse today for Hb of 7.3 and evaluating for blood loss.  Adding amlodipine for BP.  Alizae Bechtel B,MD 11/24/2015 1:54 PM   Subjective:   Patient still complaining of left-sided back pain. Urine remains clear. Denies dark, tarry stools.    Objective:   BP (!) 168/64 (BP Location: Right Arm)   Pulse 88   Temp 98 F (36.7 C) (Oral)   Resp 18   Ht 5\' 11"  (1.803 m)   Wt 223 lb 8.7 oz (  101.4 kg)   SpO2 100%   BMI 31.18 kg/m   Intake/Output Summary (Last 24 hours) at 11/24/15 [id number] Last data filed at 11/24/15 0634  Gross per 24 hour  Intake                0 ml  Output             1175 ml  Net            -1175 ml   Net negative 0.7L since admission.   Weight change:   Physical Exam: General: Overweight  male, resting in bed Cardiac: RRR, S1, S2, no m/r/g Lungs: Right basilar crackles on exam, no increased WOB MSK: Trace/1+ pitting edema to thighs; mild TTP over L CVA and midline over thoracic spine  Neuro: No asterixis Neuro: AOx3, moves all extremities spontaneously  Imaging: US Renal  Result Date: 11/19/2015 CLINICAL DATA:  Acute renal insufficiency EXAM: RENAL / URINARY TRACT ULTRASOUND COMPLETE COMPARISON:  None. FINDINGS: Right Kidney: Length: 12.2 cm. Echogenicity is increased. Renal cortical thickness is normal. No perinephric fluid or hydronephrosis visualized. There is a cystic mass arising from the upper pole of the right kidney measuring 2.5 x 2.8 x 2.2 cm. Within this cystic mass, there is a solid-appearing focus measuring 1.4 x 1.2 x 1.2 cm. There is no sonographically demonstrable calculus or ureterectasis. Left Kidney: Length: 11.6 cm. Echogenicity is increased. Renal cortical thickness is normal. No perinephric fluid or hydronephrosis visualized. There is a cyst arising from the left kidney measuring 3.0 x 2.6 x 2.8 cm. No sonographically demonstrable calculus or ureterectasis. Bladder: Appears normal for degree of bladder distention. IMPRESSION: Kidneys are echogenic consistent with medical renal disease. No obstructing focus identified on either side. There is a cystic lesion arising from the upper pole of the right kidney which contains a solid focus. This appearance raises concern for potential renal neoplasm with a cystic component. Further evaluation with pre and post contrast MRI or CT should be considered. MRI is preferred in younger patients (due to lack of ionizing radiation) and for evaluating calcified lesion(s). Given the elevated creatinine, MRI may be a better choice for further assessment if there is no contraindication to MR imaging. There is a simple cyst in the left kidney. These results will be called to the ordering clinician or representative by the Radiologist  Assistant, and communication documented in the PACS or zVision Dashboard. Electronically Signed   By: Lowella Grip III M.D.   On: 11/19/2015 14:57       Recent Labs Lab 11/18/15 0404 11/19/15 0737 11/20/15 0326 11/21/15 0542 11/22/15 0510 11/23/15 0601 11/23/15 1948  NA 135 134* 135 138 137 138 135  K 3.8 3.6 3.6 3.5 3.7 3.9 3.7  CL 111 109 108 111 108 109 108  CO2 16* 18* 20* 20* 21* 19* 19*  GLUCOSE 229* 90 87 31* 177* 118* 258*  BUN 41* 43* 50* 51* 51* 56* 61*  CREATININE 3.20* 3.17* 3.44* 3.49* 3.60* 3.63* 3.60*  CALCIUM 8.5* 8.4* 8.3* 8.3* 7.9* 8.2* 7.9*  PHOS 5.1* 4.7*  --   --   --   --   --      Recent Labs Lab 11/18/15 0404  11/20/15 0326 11/22/15 0510 11/23/15 0601 11/23/15 1948  WBC 11.3*  < > 8.9 9.5 10.8* 10.4  NEUTROABS 6.4  --   --   --   --  5.1  HGB 7.7*  < > 7.7* 7.3* 8.0* 7.3*  HCT  24.0*  < > 23.8* 23.1* 25.4* 22.4*  MCV 87.0  < > 86.5 87.5 89.1 88.5  PLT 337  < > 352 422* 445* 405*  < > = values in this interval not displayed.  Medications:    . aspirin EC  81 mg Oral Daily  . carvedilol  12.5 mg Oral BID WC  . [START ON 11/27/2015] darbepoetin (ARANESP) injection - NON-DIALYSIS  100 mcg Subcutaneous Q Fri-1800  . donepezil  10 mg Oral QHS  . feeding supplement (NEPRO CARB STEADY)  237 mL Oral BID BM  . folic acid  1 mg Oral Daily  . heparin  5,000 Units Subcutaneous Q8H  . insulin aspart  0-5 Units Subcutaneous QHS  . insulin aspart  4 Units Subcutaneous TID WC  . insulin glargine  10 Units Subcutaneous BID  . levothyroxine  25 mcg Oral QAC breakfast  . montelukast  10 mg Oral QHS  . pantoprazole  40 mg Oral Daily  . rosuvastatin  20 mg Oral q1800  . thiamine  100 mg Oral Daily  . ticagrelor  90 mg Oral BID  . torsemide  20 mg Oral Daily   Olene Floss, MD [place], PGY-2 11/24/2015, 8:22 AM

## 2015-11-24 NOTE — Progress Notes (Signed)
Troponin today 11/24/2015 0.25. Patient is attending therapies. Denies any chest pain at this time. Cardiology Dr. Einar Gip [phone] cell number [phone] contacted and left a message. Await any further recommendations at this time

## 2015-11-24 NOTE — Progress Notes (Signed)
CRITICAL VALUE ALERT  Critical value received:  troponin  Date of notification:  11/24/15  Time of notification:  1110  Critical value read back:yes  Nurse who received alert:  Rayetta Humphrey  MD notified (1st page): Marlowe Shores  Time of first page:  1111  MD notified (2nd page):  Time of second page:  Responding MD:  Marlowe Shores  Time MD responded: 1120

## 2015-11-24 NOTE — Progress Notes (Addendum)
Dillon Thomas PHYSICAL MEDICINE & REHABILITATION     PROGRESS NOTE  Subjective/Complaints:  Pt seen sitting up in bed this AM eating breakfast.  He states he had a terrible night, "as usual".  He has questions about left rib pain.   ROS: +Left rib pain.  Denies CP, SOB, N/V/D.  Objective: Vital Signs: Blood pressure (!) 168/64, pulse 88, temperature 98 F (36.7 C), temperature source Oral, resp. rate 18, height 5\' 11"  (1.803 m), weight 101.4 kg (223 lb 8.7 oz), SpO2 100 %. No results found.  Recent Labs  11/23/15 0601 11/23/15 1948  WBC 10.8* 10.4  HGB 8.0* 7.3*  HCT 25.4* 22.4*  PLT 445* 405*    Recent Labs  11/23/15 0601 11/23/15 1948  NA 138 135  K 3.9 3.7  CL 109 108  GLUCOSE 118* 258*  BUN 56* 61*  CREATININE 3.63* 3.60*  CALCIUM 8.2* 7.9*   CBG (last 3)   Recent Labs  11/23/15 0806 11/23/15 1158 11/23/15 1703  GLUCAP 124* 277* 221*    Wt Readings from Last 3 Encounters:  11/23/15 101.4 kg (223 lb 8.7 oz)  11/18/15 101.4 kg (223 lb 8.7 oz)    Physical Exam:  BP (!) 168/64 (BP Location: Right Arm)   Pulse 88   Temp 98 F (36.7 C) (Oral)   Resp 18   Ht 5\' 11"  (1.803 m)   Wt 101.4 kg (223 lb 8.7 oz)   SpO2 100%   BMI 31.18 kg/m  Constitutional: He appears well-developed. NAD. Obese. HENT: Normocephalicand atraumatic.  Eyes: EOMare normal. No discharge. Cardiovascular: Normal rateand regular rhythm. No JVD. Respiratory: Effort normaland breath sounds normal. No respiratory distress.  GI: Soft. Bowel sounds are normal. He exhibits no distension.  Musculoskeletal: He exhibits edema. He exhibits no tenderness.  Neurological: He is alert.  Delay in processing. Motor: LUE: 4+/5 proximal to distal RUE: 4+/5 proximal to distal RLE: hip flexion 4+/5, knee extension 4+/5, ankle dorsi/plantar flexion 5/5 LLE; Hip flexion 4/5, knee extension 4+/5, ankle dorsi/plantar flexion 5/5 Skin:  Skin tear on left forearmhealing. Psychiatric: He has a normal  mood and affect. His behavior is normal   Assessment/Plan: 1. Functional deficits secondary to debilitation which require 3+ hours per day of interdisciplinary therapy in a comprehensive inpatient rehab setting. Physiatrist is providing close team supervision and 24 hour management of active medical problems listed below. Physiatrist and rehab team continue to assess barriers to discharge/monitor patient progress toward functional and medical goals.  Function:  Bathing Bathing position      Bathing parts      Bathing assist        Upper Body Dressing/Undressing Upper body dressing                    Upper body assist        Lower Body Dressing/Undressing Lower body dressing                                  Lower body assist        Toileting Toileting          Toileting assist     Transfers Chair/bed transfer             Locomotion Ambulation           Wheelchair          Cognition Comprehension Comprehension assist level: Follows basic conversation/direction with  no assist  Expression Expression assist level: Expresses basic needs/ideas: With no assist  Social Interaction Social Interaction assist level: Interacts appropriately with others with medication or extra time (anti-anxiety, antidepressant).  Problem Solving Problem solving assist level: Solves basic problems with no assist  Memory Memory assist level: More than reasonable amount of time     Medical Problem List and Plan: 1.  Debilitation/acute encephalopathy secondary to DKA/NSTEMI--plan cardiac catheterization 2-4 weeks per Dr. Einar Gip  Begin CIR 2.  DVT Prophylaxis/Anticoagulation: Subcutaneous heparin. Monitor platelet counts and any signs of bleeding 3. Pain Management: Hydrocodone as needed 4. Mood: Aricept 10 mg daily at bedtime 5. Neuropsych: This patient is capable of making decisions on his own behalf. 6. Skin/Wound Care: Routine skin checks 7.  Fluids/Electrolytes/Nutrition: Routine I&Os 8. CKD-3 with baseline creatinine 2.00. Renal service continued to follow with workup ongoing. Demadex initiated 11/20/1998 1740 mg twice a day.   Cr. 3.60 on 11/6  Appreciate nephro recs 9. Uncontrolled hypertension. Coreg 12.5 mg twice a day  Elevated, will monitor with increased activity 10. Diabetes mellitus with peripheral neuropathy. Hemoglobin A1c of 10. Home regimen of trandjenta and NovoLog 70/30 80 units at breakfast, 72 units with dinner  Lantus insulin 10 units twice a day.   Check blood sugars before meals and at bedtime.   Labile, will monitor with increased activity 11. Hyperlipidemia. Crestor 12. Hypothyroidism.TSH 2.954. Continue synthroid 13. Acute on chronic anemia. Continue Aranesp  Hb 7.3 on 11/6  Will cont to monitor 14. Cervical spondylosis: Monitor 15. Leukocytosis:   WBCs 10.4 on 11/6  Cont to monitor 16. Rib pain  ?Potentially angina.  Will need to monitor with increased activity.  Will order ECG and troponin 17. Sleep disturbance  Trazodone 50 PRN  LOS (Days) 1 A FACE TO FACE EVALUATION WAS PERFORMED  Dillon Thomas 11/24/2015 8:45 AM

## 2015-11-24 NOTE — Evaluation (Signed)
Physical Therapy Assessment and Plan  Patient Details  Name: Dillon Thomas MRN: [id number] Date of Birth: 10-05-1950  PT Diagnosis: Abnormality of gait, Difficulty walking and Muscle weakness Rehab Potential: Good ELOS: 10 days   Today's Date: 11/24/2015 PT Individual Time: 0800-0900 PT Individual Time Calculation (min): 60 min     Problem List:  Patient Active Problem List   Diagnosis Date Noted  . Stage 3 chronic kidney disease   . Essential hypertension   . Poorly controlled type 2 diabetes mellitus with peripheral neuropathy ([place])   . Rib pain on left side   . Debility 11/23/2015  . Debilitated 11/23/2015  . Anemia of chronic disease   . Renal mass   . Hyperlipidemia   . Acute blood loss anemia   . Benign essential HTN   . Diabetic ketoacidosis with coma associated with type 2 diabetes mellitus ([place])   . Hemangioma   . History of TIA (transient ischemic attack)   . Hyperglycemia   . Hypothyroidism   . Lymphocytosis   . NSTEMI (non-ST elevated myocardial infarction) ([place])   . Spondylosis of cervical region without myelopathy or radiculopathy   . Uncontrolled type 2 diabetes mellitus with peripheral neuropathy ([place])   . Skin tear of left elbow without complication   . Pressure injury of skin 11/16/2015  . Obtunded   . Right sided weakness   . Altered mental status 11/15/2015  . Acute metabolic encephalopathy 84/03/7541  . AKI (acute kidney injury) ([place]) 11/15/2015  . CKD (chronic kidney disease) stage 3, GFR 30-59 ml/min 11/15/2015  . Anemia 11/15/2015  . Hyponatremia 11/15/2015  . Hyperosmolar (nonketotic) coma ([place]) 11/15/2015  . Elevated troponin 11/15/2015  . Tachycardia 11/15/2015  . Accelerated hypertension 11/15/2015    Past Medical History:  Past Medical History:  Diagnosis Date  . BPH (benign prostatic hyperplasia)   . Chronic kidney disease   . Depression   . Diabetes mellitus without complication ([place])   . GERD (gastroesophageal reflux disease)    . Hyperlipidemia   . Hypertension   . Hypothyroidism   . TIA (transient ischemic attack)    Past Surgical History:  Past Surgical History:  Procedure Laterality Date  . APPENDECTOMY    . CHOLECYSTECTOMY    . CYSTOURETHROSCOPY    . POLYPECTOMY     Vocal cord polyp removed  . VASECTOMY      Assessment & Plan Clinical Impression:Dillon E Mooreis a 65 y.o.right handed malewith history of uncontrolled type 2 diabetes mellitus with peripheral neuropathy, CKD-3 baseline creatinine 2.00, TIA. Presented 11/15/2015 with progressive shuffling gait.Per chart review patient lives alone. Reportedly independent prior to admission still driving short distances.Patient was found down by significant other and could not speak. Findings of elevated glucose greater than 700.Hemoglobin A1c of 10.MRI of the brain showed no evidence of acute infarct. MRI cervical spine with spondylosis and some canal narrowing from C4-5 through C7-T1 most pronounced at C4-5. Patient received 2 L of normal saline started on insulin infusion per protocol. Hypertensive systolic pressure 606-770 as well as tachycardia 120s. Troponin 0.33. Creatinine on admission 3.30 from baseline around 2.00. Echocardiogram with ejection fraction of 55% no wall motion abnormalities. EEG showed mild diffuse slowing of the electrocerebral activity, no seizure activity. Neurology consulted with workup ongoing. Cardiology consulted for elevated troponin suspect NSTEMIwith Brilintaadded. Plan coronary angiography when patient medically stable and metabolic issues resolved. Physical therapy evaluation completed 11/18/2015 with recommendations of physical medicine rehabilitation consult. Patient transferred to CIR on 11/23/2015 .  Patient currently requires min with mobility secondary to muscle weakness, decreased cardiorespiratoy endurance and decreased sitting balance, decreased standing balance, decreased postural control and decreased balance  strategies.  Prior to hospitalization, patient was modified independent  with mobility and lived with   in a   home.  Home access is   .  Patient will benefit from skilled PT intervention to maximize safe functional mobility, minimize fall risk and decrease caregiver burden for planned discharge home with intermittent assist.  Anticipate patient will benefit from follow up [facility] at discharge.  PT - End of Session Activity Tolerance: Tolerates 30+ min activity with multiple rests Endurance Deficit: Yes Endurance Deficit Description: requires frequent rest breaks, increased time to complete all tasks d/t fatigue/pain PT Assessment Rehab Potential (ACUTE/IP ONLY): Good Barriers to Discharge: Decreased caregiver support Barriers to Discharge Comments: lives alone, SO possibly moving in soon PT Patient demonstrates impairments in the following area(s): Balance;Pain;Safety;Endurance PT Transfers Functional Problem(s): Bed Mobility;Bed to Chair;Car;Furniture PT Locomotion Functional Problem(s): Ambulation;Stairs;Wheelchair Mobility PT Plan PT Intensity: Minimum of 1-2 x/day ,45 to 90 minutes PT Frequency: 5 out of 7 days PT Duration Estimated Length of Stay: 10 days PT Treatment/Interventions: Stair training;Therapeutic Exercise;UE/LE Coordination activities;Wheelchair propulsion/positioning;UE/LE Strength taining/ROM;Therapeutic Activities;Psychosocial support;Pain management;Patient/family education;Neuromuscular re-education;Disease management/prevention;Community reintegration;Balance/vestibular training;Ambulation/gait training;Discharge planning;DME/adaptive equipment instruction;Functional mobility training PT Transfers Anticipated Outcome(s): modI with LRAD PT Locomotion Anticipated Outcome(s): modI with LRAD in home, S stairs and community gait PT Recommendation Follow Up Recommendations: Home health PT Patient destination: Home Equipment Recommended: To be determined Equipment Details: has  RW and [facility]  Skilled Therapeutic Intervention Pt received supine in bed, c/o pain as below and agreeable to treatment. PT initial evaluation performed and completed with min guard/S overall as described below. Eval limited by slow initiation of all tasks due to pain in ribs. Home environment seems unclear, with pt reporting girlfriend lives 20 miles away but is potentially going to move in with him if they can get the license to get married this week; will follow up with CSW to clarify living situation as d/c approaches. Educated pt in rehab process, goals, estimated length of stay, falls prevention safety using call bell to request assistance to get up; pt agreeable to all the above.   PT Evaluation Precautions/Restrictions Precautions Precautions: Fall Restrictions Weight Bearing Restrictions: No General Chart Reviewed: Yes Response to Previous Treatment: Not applicable Family/Caregiver Present: No  Pain Pain Assessment Pain Assessment: 0-10 Pain Score: 5  Pain Type: Acute pain Pain Location: Rib cage Pain Orientation: Left Pain Descriptors / Indicators: Sore;Aching Pain Frequency: Intermittent Pain Onset: On-going Patients Stated Pain Goal: 3 Pain Intervention(s): Repositioned;Ambulation/increased activity Multiple Pain Sites: No Home Living/Prior Functioning Home Living Available Help at Discharge: Available PRN/intermittently;Other (Comment) (significant other) Type of Home: House Home Access: Stairs to enter [employer] of Steps: 3 Entrance Stairs-Rails: Can reach both;Left;Right Additional Comments: pt reports that he is supposed to walk with his cane or rollator but that he doesn't  Lives With: Alone Prior Function Level of Independence: Requires assistive device for independence;Independent with gait;Independent with homemaking with ambulation;Independent with transfers  Able to Take Stairs?: Yes Driving: Yes Vocation: Retired Leisure: Hobbies-yes  (Comment) Comments: Watching his girlfriends dogs, yard work, Arts development officer Comments: WFL  Cognition Overall Cognitive Status: No family/caregiver present to determine baseline cognitive functioning Arousal/Alertness: Awake/alert Orientation Level: Oriented X4 Memory: Impaired Memory Impairment: Decreased recall of new information Decreased Short Term Memory: Verbal basic Awareness: Appears intact Problem Solving: Appears intact Behaviors:  Poor frustration tolerance Safety/Judgment: Appears intact Sensation Sensation Light Touch: Appears Intact Stereognosis: Not tested Hot/Cold: Not tested Proprioception: Appears Intact Coordination Heel Shin Test: Limited speed/excursion d/t strength deficits Motor  Motor Motor - Skilled Clinical Observations: generalized weakness, slow initiation d/t pain  Mobility Bed Mobility Bed Mobility: Supine to Sit Supine to Sit: 5: Supervision;With rails;HOB elevated;Other (comment) (declined performance with HOB flat d/t pain) Supine to Sit Details: Verbal cues for technique;Verbal cues for precautions/safety Transfers Transfers: Yes Stand Pivot Transfers: 5: Supervision Stand Pivot Transfer Details: Verbal cues for technique;Verbal cues for precautions/safety Locomotion  Ambulation Ambulation: Yes Ambulation/Gait Assistance: 4: Min guard Ambulation Distance (Feet): 40 Feet Assistive device: Rolling walker Gait Gait: Yes Gait Pattern: Impaired Gait Pattern: Shuffle;Poor foot clearance - left;Poor foot clearance - right;Lateral hip instability;Decreased stride length Gait velocity: decreased Stairs / Additional Locomotion Stairs: No (declined d/t pain, fatigue) Wheelchair Mobility Wheelchair Mobility: Yes Wheelchair Assistance: 5: Investment banker, operational Details: Verbal cues for Marketing executive: Both upper extremities Wheelchair Parts Management: Needs assistance Distance: 150'   Trunk/Postural Assessment  Cervical Assessment Cervical Assessment: Within Functional Limits Thoracic Assessment Thoracic Assessment: Within Functional Limits Lumbar Assessment Lumbar Assessment: Within Functional Limits Postural Control Postural Control: Within Functional Limits  Balance Balance Balance Assessed: Yes Static Sitting Balance Static Sitting - Balance Support: Right upper extremity supported Static Sitting - Level of Assistance: 5: Stand by assistance Dynamic Sitting Balance Dynamic Sitting - Balance Support: Right upper extremity supported Dynamic Sitting - Level of Assistance: 5: Stand by assistance Dynamic Sitting - Balance Activities: Lateral lean/weight shifting;Forward lean/weight shifting;Reaching across midline Static Standing Balance Static Standing - Balance Support: Bilateral upper extremity supported Static Standing - Level of Assistance: 5: Stand by assistance Dynamic Standing Balance Dynamic Standing - Balance Support: Bilateral upper extremity supported Dynamic Standing - Level of Assistance: 5: Stand by assistance;4: Min assist Dynamic Standing - Balance Activities: Lateral lean/weight shifting;Forward lean/weight shifting;Reaching across midline Extremity Assessment   BUE WFL for functional mobility; defer to OT exam for detailed assessment   RLE Assessment RLE Assessment: Exceptions to [facility] (4-/5 throughout) LLE Assessment LLE Assessment: Exceptions to [facility] (4-/5 throughout)   See Function Navigator for Current Functional Status.   Refer to Care Plan for Long Term Goals  Recommendations for other services: None  Discharge Criteria: Patient will be discharged from PT if patient refuses treatment 3 consecutive times without medical reason, if treatment goals not met, if there is a change in medical status, if patient makes no progress towards goals or if patient is discharged from hospital.  The above assessment, treatment plan, treatment  alternatives and goals were discussed and mutually agreed upon: by patient  Luberta Mutter 11/24/2015, 12:51 PM

## 2015-11-24 NOTE — Progress Notes (Signed)
Patient information reviewed and entered into eRehab system by Luceil Herrin, RN, CRRN, PPS Coordinator.  Information including medical coding and functional independence measure will be reviewed and updated through discharge.     Per nursing patient was given "Data Collection Information Summary for Patients in Inpatient Rehabilitation Facilities with attached "Privacy Act Statement-Health Care Records" upon admission.  Present in education notebook.  

## 2015-11-25 ENCOUNTER — Inpatient Hospital Stay (HOSPITAL_COMMUNITY): Payer: MEDICARE | Admitting: Occupational Therapy

## 2015-11-25 ENCOUNTER — Inpatient Hospital Stay (HOSPITAL_COMMUNITY): Payer: PRIVATE HEALTH INSURANCE | Admitting: Physical Therapy

## 2015-11-25 DIAGNOSIS — E46 Unspecified protein-calorie malnutrition: Secondary | ICD-10-CM

## 2015-11-25 DIAGNOSIS — E8779 Other fluid overload: Secondary | ICD-10-CM

## 2015-11-25 DIAGNOSIS — J90 Pleural effusion, not elsewhere classified: Secondary | ICD-10-CM

## 2015-11-25 DIAGNOSIS — E8770 Fluid overload, unspecified: Secondary | ICD-10-CM

## 2015-11-25 DIAGNOSIS — E1142 Type 2 diabetes mellitus with diabetic polyneuropathy: Secondary | ICD-10-CM

## 2015-11-25 DIAGNOSIS — E8809 Other disorders of plasma-protein metabolism, not elsewhere classified: Secondary | ICD-10-CM

## 2015-11-25 DIAGNOSIS — G479 Sleep disorder, unspecified: Secondary | ICD-10-CM

## 2015-11-25 DIAGNOSIS — R079 Chest pain, unspecified: Secondary | ICD-10-CM

## 2015-11-25 LAB — RENAL FUNCTION PANEL
Albumin: 1.8 g/dL — ABNORMAL LOW (ref 3.5–5.0)
Anion gap: 11 (ref 5–15)
BUN: 57 mg/dL — AB (ref 6–20)
CHLORIDE: 106 mmol/L (ref 101–111)
CO2: 18 mmol/L — AB (ref 22–32)
Calcium: 8.4 mg/dL — ABNORMAL LOW (ref 8.9–10.3)
Creatinine, Ser: 3.54 mg/dL — ABNORMAL HIGH (ref 0.61–1.24)
GFR calc Af Amer: 19 mL/min — ABNORMAL LOW (ref >60)
GFR calc non Af Amer: 17 mL/min — ABNORMAL LOW (ref >60)
GLUCOSE: 236 mg/dL — AB (ref 65–99)
POTASSIUM: 3.9 mmol/L (ref 3.5–5.1)
Phosphorus: 4.4 mg/dL (ref 2.5–4.6)
Sodium: 135 mmol/L (ref 135–145)

## 2015-11-25 LAB — TYPE AND SCREEN
ABO/RH(D): A POS
Antibody Screen: NEGATIVE
Unit division: 0
Unit division: 0

## 2015-11-25 LAB — CBC
HEMATOCRIT: 28.4 % — AB (ref 39.0–52.0)
Hemoglobin: 9 g/dL — ABNORMAL LOW (ref 13.0–17.0)
MCH: 28.2 pg (ref 26.0–34.0)
MCHC: 31.7 g/dL (ref 30.0–36.0)
MCV: 89 fL (ref 78.0–100.0)
Platelets: 456 10*3/uL — ABNORMAL HIGH (ref 150–400)
RBC: 3.19 MIL/uL — ABNORMAL LOW (ref 4.22–5.81)
RDW: 15.2 % (ref 11.5–15.5)
WBC: 11.2 10*3/uL — ABNORMAL HIGH (ref 4.0–10.5)

## 2015-11-25 LAB — GLUCOSE, CAPILLARY
GLUCOSE-CAPILLARY: 187 mg/dL — AB (ref 65–99)
GLUCOSE-CAPILLARY: 172 mg/dL — AB (ref 65–99)
Glucose-Capillary: 241 mg/dL — ABNORMAL HIGH (ref 65–99)
Glucose-Capillary: 288 mg/dL — ABNORMAL HIGH (ref 65–99)

## 2015-11-25 LAB — OCCULT BLOOD X 1 CARD TO LAB, STOOL: Fecal Occult Bld: NEGATIVE

## 2015-11-25 LAB — MAGNESIUM: Magnesium: 1.6 mg/dL — ABNORMAL LOW (ref 1.7–2.4)

## 2015-11-25 MED ORDER — TRAZODONE HCL 50 MG PO TABS: 100.0000 mg | ORAL_TABLET | Freq: Every evening | ORAL | Status: DC | PRN

## 2015-11-25 MED ORDER — CARVEDILOL 25 MG PO TABS
25.0000 mg | ORAL_TABLET | Freq: Two times a day (BID) | ORAL | Status: DC
  Administered 2015-11-25 – 2015-12-04 (×18): 25 mg via ORAL
  Filled 2015-11-25 (×18): qty 1

## 2015-11-25 MED ORDER — TRAZODONE HCL 50 MG PO TABS
150.0000 mg | ORAL_TABLET | Freq: Every evening | ORAL | Status: DC | PRN
  Administered 2015-11-25 – 2015-11-28 (×4): 150 mg via ORAL
  Filled 2015-11-25 (×4): qty 3

## 2015-11-25 MED ORDER — INSULIN ASPART 100 UNIT/ML ~~LOC~~ SOLN
0.0000 [IU] | Freq: Three times a day (TID) | SUBCUTANEOUS | Status: DC
  Administered 2015-11-25: 8 [IU] via SUBCUTANEOUS
  Administered 2015-11-26: 3 [IU] via SUBCUTANEOUS
  Administered 2015-11-26: 5 [IU] via SUBCUTANEOUS
  Administered 2015-11-27 (×2): 3 [IU] via SUBCUTANEOUS
  Administered 2015-11-28: 5 [IU] via SUBCUTANEOUS
  Administered 2015-11-28: 2 [IU] via SUBCUTANEOUS
  Administered 2015-11-28: 5 [IU] via SUBCUTANEOUS
  Administered 2015-11-29: 2 [IU] via SUBCUTANEOUS
  Administered 2015-11-29: 11 [IU] via SUBCUTANEOUS
  Administered 2015-11-29: 3 [IU] via SUBCUTANEOUS
  Administered 2015-11-30: 5 [IU] via SUBCUTANEOUS
  Administered 2015-11-30: 8 [IU] via SUBCUTANEOUS
  Administered 2015-11-30 – 2015-12-01 (×3): 5 [IU] via SUBCUTANEOUS
  Administered 2015-12-01: 11 [IU] via SUBCUTANEOUS
  Administered 2015-12-02: 8 [IU] via SUBCUTANEOUS
  Administered 2015-12-02: 5 [IU] via SUBCUTANEOUS
  Administered 2015-12-02 – 2015-12-04 (×5): 3 [IU] via SUBCUTANEOUS

## 2015-11-25 MED ORDER — PRO-STAT SUGAR FREE PO LIQD
30.0000 mL | Freq: Two times a day (BID) | ORAL | Status: DC
  Administered 2015-11-25 – 2015-12-04 (×19): 30 mL via ORAL
  Filled 2015-11-25 (×19): qty 30

## 2015-11-25 MED ORDER — FERROUS SULFATE 325 (65 FE) MG PO TABS
325.0000 mg | ORAL_TABLET | Freq: Two times a day (BID) | ORAL | Status: DC
  Administered 2015-11-25 – 2015-12-04 (×14): 325 mg via ORAL
  Filled 2015-11-25 (×17): qty 1

## 2015-11-25 MED ORDER — TORSEMIDE 20 MG PO TABS
20.0000 mg | ORAL_TABLET | Freq: Two times a day (BID) | ORAL | Status: DC
  Administered 2015-11-25 – 2015-12-04 (×17): 20 mg via ORAL
  Filled 2015-11-25 (×18): qty 1

## 2015-11-25 MED ORDER — VITAMIN C 500 MG PO TABS
250.0000 mg | ORAL_TABLET | Freq: Two times a day (BID) | ORAL | Status: DC
  Administered 2015-11-25 – 2015-12-04 (×19): 250 mg via ORAL
  Filled 2015-11-25 (×19): qty 1

## 2015-11-25 MED ORDER — INFLUENZA VAC SPLIT QUAD 0.5 ML IM SUSY
0.5000 mL | PREFILLED_SYRINGE | INTRAMUSCULAR | Status: AC
  Administered 2015-11-26: 0.5 mL via INTRAMUSCULAR
  Filled 2015-11-25: qty 0.5

## 2015-11-25 NOTE — Progress Notes (Signed)
Physical Therapy Session Note  Patient Details  Name: AVONTE DIELEMAN MRN: [id number] Date of Birth: [birth year]  Today's Date: 11/25/2015 PT Individual Time: [id number] PT Individual Time Calculation (min): 48 min    Short Term Goals: Week 1:  PT Short Term Goal 1 (Week 1): =LTG due to estimated LOS  Skilled Therapeutic Interventions/Progress Updates:    Pt resting in bed, reporting that pain is much improved and 2/10, agreeable to therapy session.  Session focus on activity tolerance, gait training, and stair negotiation.  Pt transitioned supine>sit with supervision and heavy use of bed rails. Sit<>stand throughout session with supervision.  Gait training x80' with close supervision and pt demonstrates forward flexion and shuffling gait.  Stair negotiation x8 steps with 2 rails and steady assist with min verbal cues for sequencing and safety.  PT switched pt w/c cushion for ROHO for improved comfort and instructed in weight shifting/pressure relief for sores on bottom. Pt left in w/c in therapy gym to await final therapy session.  Therapy Documentation Precautions:  Precautions Precautions: Fall Restrictions Weight Bearing Restrictions: No   See Function Navigator for Current Functional Status.   Therapy/Group: Individual Therapy  Earnest Conroy Penven-Crew 11/25/2015, 4:54 PM

## 2015-11-25 NOTE — Progress Notes (Addendum)
[facility] PHYSICAL MEDICINE & REHABILITATION     PROGRESS NOTE  Subjective/Complaints:  Pt seen laying in bed this AM.  He asks about his rib pain, when discussed the possibility of this being cardiogenic, pt unaware of NSTEMI.  He also states he did not sleep well overnight, despite sleep aid.  ROS:  +Left chest discomfort.  Denies SOB, N/V/D.  Objective: Vital Signs: Blood pressure (!) 170/73, pulse 79, temperature 97.8 F (36.6 C), temperature source Oral, resp. rate 17, height 5\' 11"  (1.803 m), weight 98.2 kg (216 lb 6.4 oz), SpO2 97 %. Dg Chest 2 View  Result Date: 11/24/2015 CLINICAL DATA:  Fluid overload, pain left posterior chest region EXAM: CHEST  2 VIEW COMPARISON:  11/15/2015, 01/09/2013 FINDINGS: Mildly elevated left diaphragm. On the lateral view, there is hazy posterior lung opacification possibly related to an effusion, likely on the left side. Heart size is normal. Pulmonary vascularity within normal limits. No pneumothorax. IMPRESSION: Probable left-sided pleural effusion, best seen on lateral view with atelectasis or infiltrate at the left lung base. Electronically Signed   By: Donavan Foil M.D.   On: 11/24/2015 22:10    Recent Labs  11/23/15 0601 11/23/15 1948  WBC 10.8* 10.4  HGB 8.0* 7.3*  HCT 25.4* 22.4*  PLT 445* 405*    Recent Labs  11/23/15 1948 11/24/15 0912  NA 135 136  K 3.7 4.0  CL 108 108  GLUCOSE 258* 219*  BUN 61* 57*  CREATININE 3.60* 3.55*  CALCIUM 7.9* 8.4*   CBG (last 3)   Recent Labs  11/24/15 0659 11/24/15 0739 11/24/15 1115  GLUCAP 63* 117* 211*    Wt Readings from Last 3 Encounters:  11/24/15 98.2 kg (216 lb 6.4 oz)  11/18/15 101.4 kg (223 lb 8.7 oz)    Physical Exam:  BP (!) 170/73 (BP Location: Right Arm)   Pulse 79   Temp 97.8 F (36.6 C) (Oral)   Resp 17   Ht 5\' 11"  (1.803 m)   Wt 98.2 kg (216 lb 6.4 oz)   SpO2 97%   BMI 30.18 kg/m  Constitutional: He appears well-developed. NAD. Obese. HENT:  Normocephalicand atraumatic.  Eyes: EOMare normal. No discharge. Cardiovascular: RRR. No JVD. Respiratory: Effort normaland breath sounds normal. No respiratory distress.  GI: Soft. Bowel sounds are normal. He exhibits no distension.  Musculoskeletal: He exhibits edema. He exhibits no tenderness.  Neurological: He is alert.  Delay in processing. Motor: LUE: 4+/5 proximal to distal RUE: 4+/5 proximal to distal RLE: hip flexion 4+/5, knee extension 4+/5, ankle dorsi/plantar flexion 5/5 LLE; Hip flexion 4/5, knee extension 4+/5, ankle dorsi/plantar flexion 5/5(Unchanged) Skin: Skin tear on left forearmhealing. Psychiatric: He has a normal mood and affect. His behavior is normal   Assessment/Plan: 1. Functional deficits secondary to debilitation which require 3+ hours per day of interdisciplinary therapy in a comprehensive inpatient rehab setting. Physiatrist is providing close team supervision and 24 hour management of active medical problems listed below. Physiatrist and rehab team continue to assess barriers to discharge/monitor patient progress toward functional and medical goals.  Function:  Bathing Bathing position   Position: Sitting EOB  Bathing parts Body parts bathed by patient: Right arm, Left arm, Chest, Abdomen, Right upper leg, Left upper leg Body parts bathed by helper: Right lower leg, Left lower leg, Back  Bathing assist Assist Level:  (mod A)      Upper Body Dressing/Undressing Upper body dressing   What is the patient wearing?: Pull over shirt/dress  Pull over shirt/dress - Perfomed by patient: Thread/unthread right sleeve, Thread/unthread left sleeve, Pull shirt over trunk Pull over shirt/dress - Perfomed by helper: Put head through opening        Upper body assist Assist Level: Touching or steadying assistance(Pt > 75%)      Lower Body Dressing/Undressing Lower body dressing   What is the patient wearing?: Pants, Non-skid slipper socks      Pants- Performed by patient: Pull pants up/down Pants- Performed by helper: Thread/unthread right pants leg, Thread/unthread left pants leg   Non-skid slipper socks- Performed by helper: Don/doff left sock                  Lower body assist Assist for lower body dressing: Touching or steadying assistance (Pt > 75%)      Toileting Toileting Toileting activity did not occur: No continent bowel/bladder event   Toileting steps completed by helper: Adjust clothing prior to toileting, Performs perineal hygiene, Adjust clothing after toileting Toileting Assistive Devices: Grab bar or rail  Toileting assist Assist level: More than reasonable time   Transfers Chair/bed transfer   Chair/bed transfer method: Stand pivot Chair/bed transfer assist level: Supervision or verbal cues Chair/bed transfer assistive device: Walker, Armrests, Bedrails     Locomotion Ambulation     Max distance: 40 Assist level: Touching or steadying assistance (Pt > 75%)   Wheelchair   Type: Manual Max wheelchair distance: 150 Assist Level: Supervision or verbal cues  Cognition Comprehension Comprehension assist level: Follows basic conversation/direction with no assist  Expression Expression assist level: Expresses basic needs/ideas: With no assist  Social Interaction Social Interaction assist level: Interacts appropriately 75 - 89% of the time - Needs redirection for appropriate language or to initiate interaction.  Problem Solving Problem solving assist level: Solves basic problems with no assist  Memory Memory assist level: More than reasonable amount of time     Medical Problem List and Plan: 1.  Debilitation/acute encephalopathy secondary to DKA/NSTEMI--plan cardiac catheterization 2-4 weeks per Dr. Einar Gip  Cont CIR, will need to monitor CP with increased activity  Appreciate Cards recs, to evaluate today 2.  DVT Prophylaxis/Anticoagulation: Subcutaneous heparin. Monitor platelet counts and any  signs of bleeding 3. Pain Management: Hydrocodone as needed 4. Mood: Aricept 10 mg daily at bedtime 5. Neuropsych: This patient is capable of making decisions on his own behalf. 6. Skin/Wound Care: Routine skin checks 7. Fluids/Electrolytes/Nutrition: Routine I&Os 8. CKD-3 with baseline creatinine 2.00. Renal service continued to follow with workup ongoing. Demadex initiated 11/20/1998 1740 mg twice a day.   Cr. 3.55 on 11/7  Appreciate nephro recs 9. Uncontrolled hypertension.   Coreg 12.5 mg twice a day, increased to 25 on 11/8  Elevated, will monitor with increased activity 10. Diabetes mellitus with peripheral neuropathy. Hemoglobin A1c of 10. Home regimen of trandjenta and NovoLog 70/30 80 units at breakfast, 72 units with dinner  Lantus insulin 10 units twice a day.   Check blood sugars before meals and at bedtime.   Extremely labile, will monitor with increased activity and consider adjustments following trend 11. Hyperlipidemia. Crestor 12. Hypothyroidism.TSH 2.954. Continue synthroid 13. Acute on chronic anemia.   Continue Aranesp, FeSO4 added 11/8  Hb 7.3 on 11/6  Will cont to monitor  Labs ordered for tomorrow 14. Cervical spondylosis: Monitor 15. Leukocytosis:   WBCs 10.4 on 11/6  Cont to monitor  Labs ordered for tomorrow 16. Left chest discomfort  Angina +/- pleural effusion  ECG reviewed, troponin reviewed, improved  17. Sleep disturbance  Trazodone 50 PRN, increased to 100 on 11/8 18. Hypoalbuminemia   Supplement started 11/8 19. Pleural effusion  Cont diuresis, protein supplementation  LOS (Days) 2 A FACE TO FACE EVALUATION WAS PERFORMED  Ankit Lorie Phenix 11/25/2015 8:14 AM

## 2015-11-25 NOTE — Progress Notes (Signed)
Occupational Therapy Session Note  Patient Details  Name: Dillon Thomas MRN: [id number] Date of Birth: 1950-04-04  Today's Date: 11/25/2015 OT Individual Time: [id number] OT Individual Time Calculation (min): 57 min  and Today's Date: 11/25/2015 OT Missed Time: 18 Minutes Missed Time Reason: Patient fatigue;Patient unwilling/refused to participate without medical reason     Short Term Goals: Week 1:  OT Short Term Goal 1 (Week 1): STGs=LTGs based on short estimated LOS  Skilled Therapeutic Interventions/Progress Updates:    Upon entering the room, pt supine in bed with bed flat and B LE's elevated in bed. Pt with generalized c/o pain "all over" but agreeable to OT intervention with maximal encouragement. Pt performed supine >sit with min A for trunk. Pt performed sit >stand with close supervision and min verbal cues. Pt ambulated 10' into bathroom with RW and steady assistance for balance. Min A transfer onto TTB for bathing at shower level. Pt very fatigued and taking multiple rest breaks while in shower. Pt refusing to don clothing this session and placed in hospital gown with set up A. Pt refusing further OT intervention this session secondary to fatigue and returned to bed as he declined sitting in wheelchair. Pt supine in bed with call bell and all needed items within reach.   Therapy Documentation Precautions:  Precautions Precautions: Fall Restrictions Weight Bearing Restrictions: No General: General OT Amount of Missed Time: 18 Minutes Vital Signs:  Pain: Pain Assessment Pain Score: 0-No pain  See Function Navigator for Current Functional Status.   Therapy/Group: Individual Therapy  [name] 11/25/2015, 12:28 PM

## 2015-11-25 NOTE — Progress Notes (Signed)
Occupational Therapy Session Note  Patient Details  Name: Dillon Thomas MRN: [id number] Date of Birth: 1950-03-13  Today's Date: 11/25/2015 OT Individual Time: [id number] and [id number] OT Individual Time Calculation (min): 29 min and 28 min     Short Term Goals: Week 1:  OT Short Term Goal 1 (Week 1): STGs=LTGs based on short estimated LOS  Skilled Therapeutic Interventions/Progress Updates:    Session 1: Upon entering the room, pt supine in bed with continued c/o generalized pain. Pt requiring maximal encouragement to exit bed this session. Pt requesting to do all tasks from bed level but therapist insisted pt attempt to exit bed. Pt performed supine >sit with supervision to EOB. Sit <>stand with supervision and use of RW and min verbal cues for hand placement. Pt refusing to stand at sink for grooming tasks. Stand pivot transfer to wheelchair with steady assistance. Pt performed grooming tasks at sink with set up A. Increased time needed to perform tasks. Pt remained in wheelchair with call bell and all needed items within reach.   Session 2: Upon entering the room, pt seated in wheelchair and staff entering the room to switch bed to air mattress secondary to skin integrity. Pt propelled wheelchair via B UE's 100' for strengthening and endurance. Pt requiring 2 rest breaks secondary to fatigue. Pt returning back to room and transferring via RW to new bed. OT and RN educating pt on benefits of new mattress for skin. Pt performed sit >supine with min verbal cues for technique. Bed rails pulled up and call bell within reach.   Therapy Documentation Precautions:  Precautions Precautions: Fall Restrictions Weight Bearing Restrictions: No   Pain: Pain Assessment Pain Assessment: 0-10 Pain Score: 2  Pain Type: Acute pain Pain Location: Back Pain Orientation: Left Pain Intervention(s): Medication (See eMAR) (pain level 2 but requested vicodin)  See Function Navigator for Current  Functional Status.   Therapy/Group: Individual Therapy  [name] 11/25/2015, 8:35 PM

## 2015-11-25 NOTE — Progress Notes (Signed)
[facility] Progress Note     Subjective:   Patient still complaining of left-sided back pain. Urine remains clear. Tolerated blood transfusion yesterday well.    Objective:   BP (!) 170/73 (BP Location: Right Arm)   Pulse 79   Temp 97.8 F (36.6 C) (Oral)   Resp 17   Ht 5\' 11"  (1.803 m)   Wt 216 lb 6.4 oz (98.2 kg)   SpO2 97%   BMI 30.18 kg/m   Intake/Output Summary (Last 24 hours) at 11/25/15 0908 Last data filed at 11/25/15 [id number]  Gross per 24 hour  Intake              600 ml  Output             2325 ml  Net            -1725 ml   Net negative 2.1 since admission.   Weight change: -7 lb 2.3 oz (-3.242 kg)  Physical Exam: General: Overweight male, sitting up in chair Cardiac: RRR, S1, S2, no m/r/g Lungs: Decreased breath sounds at bases, no increased WOB MSK: 1+/2+ pitting edema over shins and trace to thighs; mild TTP over L CVA and midline over thoracic spine  Neuro: No asterixis Neuro: AOx3, moves all extremities spontaneously  Imaging: US Renal  Result Date: 11/19/2015 CLINICAL DATA:  Acute renal insufficiency EXAM: RENAL / URINARY TRACT ULTRASOUND COMPLETE COMPARISON:  None. FINDINGS: Right Kidney: Length: 12.2 cm. Echogenicity is increased. Renal cortical thickness is normal. No perinephric fluid or hydronephrosis visualized. There is a cystic mass arising from the upper pole of the right kidney measuring 2.5 x 2.8 x 2.2 cm. Within this cystic mass, there is a solid-appearing focus measuring 1.4 x 1.2 x 1.2 cm. There is no sonographically demonstrable calculus or ureterectasis. Left Kidney: Length: 11.6 cm. Echogenicity is increased. Renal cortical thickness is normal. No perinephric fluid or hydronephrosis visualized. There is a cyst arising from the left kidney measuring 3.0 x 2.6 x 2.8 cm. No sonographically demonstrable calculus or ureterectasis. Bladder: Appears normal for degree of bladder distention. IMPRESSION: Kidneys are echogenic consistent  with medical renal disease. No obstructing focus identified on either side. There is a cystic lesion arising from the upper pole of the right kidney which contains a solid focus. This appearance raises concern for potential renal neoplasm with a cystic component. Further evaluation with pre and post contrast MRI or CT should be considered. MRI is preferred in younger patients (due to lack of ionizing radiation) and for evaluating calcified lesion(s). Given the elevated creatinine, MRI may be a better choice for further assessment if there is no contraindication to MR imaging. There is a simple cyst in the left kidney. These results will be called to the ordering clinician or representative by the Radiologist Assistant, and communication documented in the PACS or zVision Dashboard. Electronically Signed   By: Lowella Grip III M.D.   On: 11/19/2015 14:57       Recent Labs Lab 11/19/15 0737 11/20/15 0326 11/21/15 0542 11/22/15 0510 11/23/15 0601 11/23/15 1948 11/24/15 0912 11/25/15 0744  NA 134* 135 138 137 138 135 136 135  K 3.6 3.6 3.5 3.7 3.9 3.7 4.0 3.9  CL 109 108 111 108 109 108 108 106  CO2 18* 20* 20* 21* 19* 19* 21* 18*  GLUCOSE 90 87 31* 177* 118* 258* 219* 236*  BUN 43* 50* 51* 51* 56* 61* 57* 57*  CREATININE 3.17* 3.44* 3.49* 3.60* 3.63* 3.60*  3.55* 3.54*  CALCIUM 8.4* 8.3* 8.3* 7.9* 8.2* 7.9* 8.4* 8.4*  PHOS 4.7*  --   --   --   --   --  4.4 4.4     Recent Labs Lab 11/22/15 0510 11/23/15 0601 11/23/15 1948 11/25/15 0739  WBC 9.5 10.8* 10.4 11.2*  NEUTROABS  --   --  5.1  --   HGB 7.3* 8.0* 7.3* 9.0*  HCT 23.1* 25.4* 22.4* 28.4*  MCV 87.5 89.1 88.5 89.0  PLT 422* 445* 405* 456*    Medications:    . amLODipine  10 mg Oral Daily  . aspirin EC  81 mg Oral Daily  . carvedilol  25 mg Oral BID WC  . [START ON 11/27/2015] darbepoetin (ARANESP) injection - NON-DIALYSIS  100 mcg Subcutaneous Q Fri-1800  . donepezil  10 mg Oral QHS  . feeding supplement (NEPRO CARB  STEADY)  237 mL Oral BID BM  . feeding supplement (PRO-STAT SUGAR FREE 64)  30 mL Oral BID  . ferrous sulfate  325 mg Oral BID WC  . folic acid  1 mg Oral Daily  . insulin aspart  0-5 Units Subcutaneous QHS  . insulin aspart  4 Units Subcutaneous TID WC  . insulin glargine  10 Units Subcutaneous BID  . levothyroxine  25 mcg Oral QAC breakfast  . montelukast  10 mg Oral QHS  . pantoprazole  40 mg Oral Daily  . rosuvastatin  20 mg Oral q1800  . sodium bicarbonate  650 mg Oral TID  . thiamine  100 mg Oral Daily  . ticagrelor  90 mg Oral BID  . torsemide  20 mg Oral Daily  . vitamin C  250 mg Oral BID WC   Background: Dillon Thomas is an 65 y.o. male with PMH of uncontrolled type 2 diabetes mellitus with peripheral neuropathy, CKD-3 baseline creatinine 2.00 (last noted in Feb of 2017-1.9), HTN, BPH, hypothyroidism, depression and TIA. He presented to the hospital after being found down and with AMS; in [place] with CBG > 800 on admission. Brain imaging normal. Elevated troponin, thought to be NSTEMI per Cards. Creatinine 3.48 on admission.  Extensive w/u - all findings c/w diabetic nephropathy  Assessment/ Plan:    1. Acute on Chronic Renal Failure: SCr stable. Suspect this is A on CRF in the setting of volume depletion on ACE with baseline diabetic nephropathy. However, given possibly nephrotic range protein and hematuria obtained more labs to rule out GN. Last SCr 1.90 in February, so elevated SCr could also represent chronic worsening in the setting of diabetic nephropathy with poorly controlled illnesses. Initial UA with 6-30 RBC per HPF; repeat 11/2 with TNC RBCs and granular casts. CK WNL. Spot urine protein 760 mg/dL. FENa 1.1%, consistent with intrinsic disease. Albumin 1.5 (was 1.9 on admission), which may represent poor nutritional status vs nephrotic syndrome /intrinsic kidney injury. Currently on Demedax 20 daily; will increase to BID as evidence of mild fluid overload on CXR, wet cough,  and pitting edema on exam despite being net negative 2.1 L since admission. Serologies: ANA, kappa/lambda light chains in process. ANCA titers pending but antibodies not elevated. C3, C4,  HIV, hepatitis panel negative. Good UOP at 2.0L and incompletely charted. Continue torsemide. Will need to establish with a Nephrologist outpatient.  Started sodium bicarb 650 mg TID 11/7 for CO2 < 20. Believe he may be at his new baseline. Nephrology will continue to follow.  2. HTN: Slightly improved but still elevated with systolic in  the 160s. Carvedilol increased to 25 mg BID today, 11/8. Added norvasc 10 mg 11/7. Increased torsemide 20 mg to BID 11/8.   3. Anemia: Improved to 9.0 s/p transfusion. Hgb as high as 12.5 on 02/11/15 via [place]. Iron studies showed severe deficiency. s/p feraheme 11/2, with repeat dose 11/6. To start aranesp 100 mcg q Friday. Received 1 u pRBCs 11/5 for hgb to 7.3 and again 11/7 was transfused 1 u pRBCs, heparin discontinued 11/7. Continue to monitor. No overt signs of bleeding. SCDs ordered. FOBT ordered.   4. Left-sided Chest Wall Pain: DG chest 11/7 with likely small left-sided pleural effusion with underlying infiltrate/atelectasis. Incentive spirometry. Maintaining O2 sats on RA.   5. Renal cysts: Nonobstructive. MRI showed complex renal cortical mass with mural nodule, concerning for renal cell carcinoma; will need further imaging with IV contrast. Would benefit from Urology consultation in the near future.   6. Elevated troponin: Concern for NSTEMI. Cardiology following. Agree with abstaining from contrast for cath for the time being. Dr. Einar Gip to reassess patient today.   7. Diabetes: Per primary team. Hgb A1c 10.0, worse from hgb A1c 8.1 07/16/15. Home regimen of tradjenta and novolog 70/30 80 U with breakfast, 72 U with dinner. Improved CBGs on 10 U lantus BID, meal coverage and SSI.  Olene Floss, Dillon Thomas [place], PGY-2 11/25/2015, 9:08 AM   I  have seen and examined this patient and agree with plan and assessment in the above note with renal recommendations/intervention highlighted. Renal function is unchanged - may be new baseline. Volume still up by exam so bumping torsemide to BID. Hb improved post transfusion. Has had IV iron and is starting Aranesp. On oral sodium bicarb for metabolic acidosis - just started yesterday and bicarb still low. Will adjust upward if no improvement next couple of days. Tweaking BP meds.  Dillon Vaile Thomas,Dillon Thomas 11/25/2015 12:21 PM

## 2015-11-26 ENCOUNTER — Inpatient Hospital Stay (HOSPITAL_COMMUNITY): Payer: PRIVATE HEALTH INSURANCE | Admitting: Physical Therapy

## 2015-11-26 ENCOUNTER — Inpatient Hospital Stay (HOSPITAL_COMMUNITY): Payer: MEDICARE | Admitting: Occupational Therapy

## 2015-11-26 ENCOUNTER — Inpatient Hospital Stay (HOSPITAL_COMMUNITY): Payer: MEDICARE

## 2015-11-26 LAB — URINALYSIS, ROUTINE W REFLEX MICROSCOPIC
Bilirubin Urine: NEGATIVE
GLUCOSE, UA: 250 mg/dL — AB
Ketones, ur: NEGATIVE mg/dL
LEUKOCYTES UA: NEGATIVE
Nitrite: NEGATIVE
PH: 6 (ref 5.0–8.0)
Protein, ur: >300 mg/dL — AB
SPECIFIC GRAVITY, URINE: 1.016 (ref 1.005–1.030)

## 2015-11-26 LAB — RENAL FUNCTION PANEL
ANION GAP: 10 (ref 5–15)
Albumin: 1.8 g/dL — ABNORMAL LOW (ref 3.5–5.0)
BUN: 63 mg/dL — AB (ref 6–20)
CHLORIDE: 106 mmol/L (ref 101–111)
CO2: 20 mmol/L — ABNORMAL LOW (ref 22–32)
Calcium: 8.4 mg/dL — ABNORMAL LOW (ref 8.9–10.3)
Creatinine, Ser: 3.54 mg/dL — ABNORMAL HIGH (ref 0.61–1.24)
GFR calc Af Amer: 19 mL/min — ABNORMAL LOW (ref >60)
GFR, EST NON AFRICAN AMERICAN: 17 mL/min — AB (ref >60)
Glucose, Bld: 122 mg/dL — ABNORMAL HIGH (ref 65–99)
POTASSIUM: 3.6 mmol/L (ref 3.5–5.1)
Phosphorus: 4.6 mg/dL (ref 2.5–4.6)
Sodium: 136 mmol/L (ref 135–145)

## 2015-11-26 LAB — GLUCOSE, CAPILLARY
GLUCOSE-CAPILLARY: 114 mg/dL — AB (ref 65–99)
Glucose-Capillary: 326 mg/dL — ABNORMAL HIGH (ref 65–99)
Glucose-Capillary: 333 mg/dL — ABNORMAL HIGH (ref 65–99)
Glucose-Capillary: 168 mg/dL — ABNORMAL HIGH (ref 65–99)

## 2015-11-26 LAB — CBC
HEMATOCRIT: 29.5 % — AB (ref 39.0–52.0)
HEMOGLOBIN: 9.5 g/dL — AB (ref 13.0–17.0)
MCH: 29 pg (ref 26.0–34.0)
MCHC: 32.2 g/dL (ref 30.0–36.0)
MCV: 89.9 fL (ref 78.0–100.0)
Platelets: 399 10*3/uL (ref 150–400)
RBC: 3.28 MIL/uL — AB (ref 4.22–5.81)
RDW: 15.4 % (ref 11.5–15.5)
WBC: 11.6 10*3/uL — AB (ref 4.0–10.5)

## 2015-11-26 LAB — URINE MICROSCOPIC-ADD ON

## 2015-11-26 MED ORDER — SODIUM BICARBONATE 650 MG PO TABS
1300.0000 mg | ORAL_TABLET | Freq: Two times a day (BID) | ORAL | Status: DC
  Administered 2015-11-26 – 2015-12-04 (×16): 1300 mg via ORAL
  Filled 2015-11-26 (×16): qty 2

## 2015-11-26 NOTE — Progress Notes (Signed)
Physical Therapy Note  Patient Details  Name: Dillon Thomas MRN: [id number] Date of Birth: 1950/02/25 Today's Date: 11/26/2015    Time: 815-826 11 minutes  1:1 No c/o pain. Pt in bed eating breakfast on PT arrival. Pt with some coughing, states he would like to sit up to finish his breakfast. Pt performed bed mobility, sitting edge of bed, stand pivot transfer with RW all with supervision. Pt positioned well in chair with breakfast.  Pt missed first 15 minutes of treatment due to nursing care.   Kellyjo Edgren 11/26/2015, 8:27 AM

## 2015-11-26 NOTE — Progress Notes (Signed)
Social Work Assessment and Plan  Patient Details  Name: Dillon Thomas MRN: [id number] Date of Birth: [birth year]  Today's Date: 11/24/2015  Problem List:  Patient Active Problem List   Diagnosis Date Noted  . Fluid overload   . DM type 2 with diabetic peripheral neuropathy ([place])   . Left sided chest pain   . Sleep disturbance   . Hypoalbuminemia due to protein-calorie malnutrition ([place])   . Pleural effusion   . Stage 3 chronic kidney disease   . Essential hypertension   . Poorly controlled type 2 diabetes mellitus with peripheral neuropathy ([place])   . Rib pain on left side   . Debility 11/23/2015  . Debilitated 11/23/2015  . Anemia of chronic disease   . Renal mass   . Hyperlipidemia   . Acute blood loss anemia   . Benign essential HTN   . Diabetic ketoacidosis with coma associated with type 2 diabetes mellitus ([place])   . Hemangioma   . History of TIA (transient ischemic attack)   . Hyperglycemia   . Hypothyroidism   . Lymphocytosis   . NSTEMI (non-ST elevated myocardial infarction) ([place])   . Spondylosis of cervical region without myelopathy or radiculopathy   . Uncontrolled type 2 diabetes mellitus with peripheral neuropathy ([place])   . Skin tear of left elbow without complication   . Pressure injury of skin 11/16/2015  . Obtunded   . Right sided weakness   . Altered mental status 11/15/2015  . Acute metabolic encephalopathy 70/96/2836  . AKI (acute kidney injury) ([place]) 11/15/2015  . CKD (chronic kidney disease) stage 3, GFR 30-59 ml/min 11/15/2015  . Anemia 11/15/2015  . Hyponatremia 11/15/2015  . Hyperosmolar (nonketotic) coma ([place]) 11/15/2015  . Elevated troponin 11/15/2015  . Tachycardia 11/15/2015  . Accelerated hypertension 11/15/2015   Past Medical History:  Past Medical History:  Diagnosis Date  . BPH (benign prostatic hyperplasia)   . Chronic kidney disease   . Depression   . Diabetes mellitus without complication ([place])   . GERD (gastroesophageal reflux  disease)   . Hyperlipidemia   . Hypertension   . Hypothyroidism   . TIA (transient ischemic attack)    Past Surgical History:  Past Surgical History:  Procedure Laterality Date  . APPENDECTOMY    . CHOLECYSTECTOMY    . CYSTOURETHROSCOPY    . POLYPECTOMY     Vocal cord polyp removed  . VASECTOMY     Social History:  reports that he quit smoking about 3 years ago. He has never used smokeless tobacco. He reports that he does not drink alcohol or use drugs.  Family / Support Systems Marital Status: Single Patient Roles: Partner, Parent Spouse/Significant Other: Gertie Baron - [phone] Children: out of town Anticipated Caregiver: Santiago Glad Ability/Limitations of Caregiver: None Caregiver Availability: 24/7 Family Dynamics: Pt reports Santiago Glad is supportive and she talks with his children to keep them updated.  Social History Preferred language: English Religion: Christian Read: Yes Write: Yes Employment Status: Disabled (now retired) Date Retired/Disabled/Unemployed: 2010 Public relations account executive Issues: none reported Guardian/Conservator: N/A - MD has determined that pt is capable of making his own decisions.   Abuse/Neglect Physical Abuse: Denies Verbal Abuse: Denies Sexual Abuse: Denies Exploitation of patient/patient's resources: Denies Self-Neglect: Denies  Emotional Status Pt's affect, behavior and adjustment status: Pt was frustrated to need to be on CIR, but thankful to have the chance to rehab.  Having some trouble adjusting to all the medical changes, but reports feeling okay overall. Recent Psychosocial  Issues: some frustration and sadness Psychiatric History: depression listed in history, but pt did not discuss this with CSW Substance Abuse History: none reported  Patient / Family Perceptions, Expectations & Goals Pt/Family understanding of illness & functional limitations: Pt has some understanding of condition/limitations, but needs updating at times  from medical team. Premorbid pt/family roles/activities: Pt likes to work in his yard and be outside. Anticipated changes in roles/activities/participation: Pt recognizes it may be a while before he can keep yard up due to fatigue. Pt/family expectations/goals: Pt wants to get out of the hospital and get back to doing his daily tasks like he used to do.  Community Resources [employer]s: None Premorbid Home Care/DME Agencies: None Transportation available at discharge: [employer] referrals recommended: Neuropsychology  Discharge Planning Support Systems: Spouse/significant other Type of Residence: Private residence Insurance underwriter Resources: [employer], Multimedia programmer (specify) Financial Resources: Radio broadcast assistant Screen Referred: No Money Management: Patient Does the patient have any problems obtaining your medications?: No Home Management: Pt's girlfriend can assist with this. Patient/Family Preliminary Plans: Pt plans to have Santiago Glad help him at home as needed. Barriers to Discharge: Steps Social Work Anticipated Follow Up Needs: HH/OP Expected length of stay: 10 days  Clinical Impression CSW met with pt to introduce self and role of CSW, as well as to complete assessment.  Pt was open with CSW and stressed the importance of team sharing information with Santiago Glad, girlfriend, as she will be the one helping him at home.  He as frustrated that other units did not honor that request.  CSW will touch base with Santiago Glad to update her on plan on CIR and d/c plan.  CSW will continue to follow and assist as needed.  Kenshawn Maciolek, Silvestre Mesi 11/26/2015, 2:44 AM

## 2015-11-26 NOTE — Progress Notes (Signed)
Occupational Therapy Session Note  Patient Details  Name: Dillon Thomas MRN: [id number] Date of Birth: 1950-03-09  Today's Date: 11/26/2015 OT Individual Time: [id number] OT Individual Time Calculation (min): 25 min  and Today's Date: 11/26/2015 OT Missed Time: 35 Minutes Missed Time Reason: Pain;Patient fatigue     Short Term Goals: Week 1:  OT Short Term Goal 1 (Week 1): STGs=LTGs based on short estimated LOS  Skilled Therapeutic Interventions/Progress Updates:     Upon entering the room, pt seated in wheelchair awaiting therapist. Pt immediately begins verbalizing 7/10 pain and initially states it as generalized pain. Upon further questions, he reports increased pain in L rib cage and buttocks. Pt requesting to return to bed. OT attempting to provide maximal encouragement for participation and education provided on importance of participation. Pt continued to decline. OT assisted pt back into bed with close supervision and use of RW to return to bed. Call bell and all needed items within reach.   Therapy Documentation Precautions:  Precautions Precautions: Fall Restrictions Weight Bearing Restrictions: No General: General OT Amount of Missed Time: 35 Minutes Vital Signs:  Pain: Pain Assessment Pain Assessment: 0-10 Pain Score: 2  Pain Type: Acute pain Pain Location: Buttocks Pain Descriptors / Indicators: Aching Pain Frequency: Constant Pain Onset: On-going Patients Stated Pain Goal: 2 Pain Intervention(s): Medication (See eMAR)  See Function Navigator for Current Functional Status.   Therapy/Group: Individual Therapy  Gypsy Decant 11/26/2015, 9:40 AM

## 2015-11-26 NOTE — Progress Notes (Signed)
[facility] Progress Note     Subjective:   No acute events overnight. Patient says left back/side pain a little worse than yesterday. No increased SOB. Feels like he is making slow progress with PT.    Objective:   BP (!) 157/58 (BP Location: Right Arm)   Pulse 61   Temp 97.8 F (36.6 C) (Oral)   Resp 18   Ht 5\' 11"  (1.803 m)   Wt 216 lb 6.4 oz (98.2 kg)   SpO2 98%   BMI 30.18 kg/m   Intake/Output Summary (Last 24 hours) at 11/26/15 0939 Last data filed at 11/26/15 0745  Gross per 24 hour  Intake              507 ml  Output              975 ml  Net             -468 ml   Net negative 2.7 since admission.   Weight change:   Physical Exam: General: Overweight male, sitting up in chair Cardiac: RRR, S1, S2, no m/r/g Lungs: CTAB but slightly decreased breath sounds over left mid-lung field, no increased WOB MSK: 1+/2+ pitting edema over shins and trace to thighs; mild TTP over L CVA and midline over thoracic spine  Neuro: No asterixis, AOx3, moves all extremities spontaneously  Imaging: US Renal  Result Date: 11/19/2015 CLINICAL DATA:  Acute renal insufficiency EXAM: RENAL / URINARY TRACT ULTRASOUND COMPLETE COMPARISON:  None. FINDINGS: Right Kidney: Length: 12.2 cm. Echogenicity is increased. Renal cortical thickness is normal. No perinephric fluid or hydronephrosis visualized. There is a cystic mass arising from the upper pole of the right kidney measuring 2.5 x 2.8 x 2.2 cm. Within this cystic mass, there is a solid-appearing focus measuring 1.4 x 1.2 x 1.2 cm. There is no sonographically demonstrable calculus or ureterectasis. Left Kidney: Length: 11.6 cm. Echogenicity is increased. Renal cortical thickness is normal. No perinephric fluid or hydronephrosis visualized. There is a cyst arising from the left kidney measuring 3.0 x 2.6 x 2.8 cm. No sonographically demonstrable calculus or ureterectasis. Bladder: Appears normal for degree of bladder distention.  IMPRESSION: Kidneys are echogenic consistent with medical renal disease. No obstructing focus identified on either side. There is a cystic lesion arising from the upper pole of the right kidney which contains a solid focus. This appearance raises concern for potential renal neoplasm with a cystic component. Further evaluation with pre and post contrast MRI or CT should be considered. MRI is preferred in younger patients (due to lack of ionizing radiation) and for evaluating calcified lesion(s). Given the elevated creatinine, MRI may be a better choice for further assessment if there is no contraindication to MR imaging. There is a simple cyst in the left kidney. These results will be called to the ordering clinician or representative by the Radiologist Assistant, and communication documented in the PACS or zVision Dashboard. Electronically Signed   By: Lowella Grip III M.D.   On: 11/19/2015 14:57       Recent Labs Lab 11/21/15 0542 11/22/15 0510 11/23/15 0601 11/23/15 1948 11/24/15 0912 11/25/15 0744 11/26/15 0802  NA 138 137 138 135 136 135 136  K 3.5 3.7 3.9 3.7 4.0 3.9 3.6  CL 111 108 109 108 108 106 106  CO2 20* 21* 19* 19* 21* 18* 20*  GLUCOSE 31* 177* 118* 258* 219* 236* 122*  BUN 51* 51* 56* 61* 57* 57* 63*  CREATININE 3.49* 3.60*  3.63* 3.60* 3.55* 3.54* 3.54*  CALCIUM 8.3* 7.9* 8.2* 7.9* 8.4* 8.4* 8.4*  PHOS  --   --   --   --  4.4 4.4 4.6     Recent Labs Lab 11/23/15 0601 11/23/15 1948 11/25/15 0739 11/26/15 0802  WBC 10.8* 10.4 11.2* 11.6*  NEUTROABS  --  5.1  --   --   HGB 8.0* 7.3* 9.0* 9.5*  HCT 25.4* 22.4* 28.4* 29.5*  MCV 89.1 88.5 89.0 89.9  PLT 445* 405* 456* 399    Results for Dillon, Thomas (MRN [id number]) as of 11/26/2015 10:38  Ref. Range 11/20/2015 11:07  Total Protein ELP Latest Ref Range: 6.0 - 8.5 g/dL 5.8 (L)  Albumin ELP Latest Ref Range: 2.9 - 4.4 g/dL 1.9 (L)  Globulin, Total Latest Ref Range: 2.2 - 3.9 g/dL 3.9  A/G Ratio Latest Ref Range:  0.7 - 1.7  0.5 (L)  Alpha-1-Globulin Latest Ref Range: 0.0 - 0.4 g/dL 0.4  Alpha-2-Globulin Latest Ref Range: 0.4 - 1.0 g/dL 0.8  Beta Globulin Latest Ref Range: 0.7 - 1.3 g/dL 0.7  Gamma Globulin Latest Ref Range: 0.4 - 1.8 g/dL 2.0 (H)  M-SPIKE, % Latest Ref Range: Not Observed g/dL 1.4 (H)  SPE Interp. Unknown Comment  Comment Unknown Comment  Kappa free light chain Latest Ref Range: 3.3 - 19.4 mg/L 1,061.4 (H)  Lamda free light chains Latest Ref Range: 5.7 - 26.3 mg/L 106.3 (H)  Kappa, lamda light chain ratio Latest Ref Range: 0.26 - 1.65  9.98 (H)   Medications:    . amLODipine  10 mg Oral Daily  . aspirin EC  81 mg Oral Daily  . carvedilol  25 mg Oral BID WC  . [START ON 11/27/2015] darbepoetin (ARANESP) injection - NON-DIALYSIS  100 mcg Subcutaneous Q Fri-1800  . donepezil  10 mg Oral QHS  . feeding supplement (NEPRO CARB STEADY)  237 mL Oral BID BM  . feeding supplement (PRO-STAT SUGAR FREE 64)  30 mL Oral BID  . ferrous sulfate  325 mg Oral BID WC  . folic acid  1 mg Oral Daily  . Influenza vac split quadrivalent PF  0.5 mL Intramuscular Tomorrow-1000  . insulin aspart  0-15 Units Subcutaneous TID WC  . insulin aspart  0-5 Units Subcutaneous QHS  . insulin aspart  4 Units Subcutaneous TID WC  . insulin glargine  10 Units Subcutaneous BID  . levothyroxine  25 mcg Oral QAC breakfast  . montelukast  10 mg Oral QHS  . pantoprazole  40 mg Oral Daily  . rosuvastatin  20 mg Oral q1800  . sodium bicarbonate  650 mg Oral TID  . thiamine  100 mg Oral Daily  . ticagrelor  90 mg Oral BID  . torsemide  20 mg Oral BID  . vitamin C  250 mg Oral BID WC   Background: Dillon Thomas is an 65 y.o. male with PMH of uncontrolled type 2 diabetes mellitus with peripheral neuropathy, CKD-3 baseline creatinine 2.00 (last noted in Feb of 2017-1.9), HTN, BPH, hypothyroidism, depression and TIA. He presented to the hospital after being found down and with AMS; in [place] with CBG > 800 on admission.  Brain imaging normal. Elevated troponin, thought to be NSTEMI per Cards. Creatinine 3.48 on admission.  Extensive w/u - all findings c/w diabetic nephropathy  Assessment/ Plan:    1. Acute on Chronic Renal Failure: SCr has been stable over last several days. Suspect this is A on CRF in the setting  of volume depletion on ACE with baseline diabetic nephropathy. However, given possibly nephrotic range protein and hematuria obtained more labs to rule out GN. Last SCr 1.90 in February, so elevated SCr could also represent chronic worsening in the setting of diabetic nephropathy with poorly controlled illnesses. Initial UA with 6-30 RBC per HPF; repeat 11/2 with TNC RBCs and granular casts. CK WNL. Spot urine protein 760 mg/dL. FENa 1.1%, consistent with intrinsic disease. Albumin 1.5 (was 1.9 on admission), which may represent poor nutritional status vs nephrotic syndrome /intrinsic kidney injury. Torsemide increased to 20 mg BID 11/8, but urine output stable/decreased though incompletely charted. However, was still net negative 0.4 L over the course of yesterday. Serologies: ANA negative. Kappa/lambda light chains elevated, with high ratio (close to 10 - new result). ANCA titers negative  C3, C4,  HIV, hepatitis panel negative. Single M spike on protein electrophoresis. Adequate UOP at 1.2L and incompletely charted. Continue torsemide BID. Will need to establish with a Nephrologist outpatient.  Started sodium bicarb 650 mg TID 11/7 for CO2 < 20; will increase to 1300 mg BID. Believe he may be at his new baseline. Nephrology will continue to follow.  2. HTN: Still elevated with systolic in the [id number] despite carvedilol increase to 25 mg BID and torsemide increase to 20 mg BID 11/8. Added norvasc 10 mg 11/7.   3. Anemia: Improved to 9.0 s/p transfusion. Hgb as high as 12.5 on 02/11/15 via [place]. Iron studies showed severe deficiency. s/p feraheme 11/2, with repeat dose 11/6. To start aranesp 100 mcg q  Friday. Received 1 u pRBCs 11/5 for hgb to 7.3 and again 11/7 was transfused 1 u pRBCs, heparin discontinued 11/7. Continue to monitor. No overt signs of bleeding. SCDs ordered. FOBT negative 11/8.   4. Left-sided Chest Wall Pain: Slightly improved. DG chest 11/7 with likely small left-sided pleural effusion with underlying infiltrate/atelectasis. Incentive spirometry. Maintaining O2 sats on RA.   5. Renal cysts: Nonobstructive. MRI showed complex renal cortical mass with mural nodule, concerning for renal cell carcinoma; will need further imaging with IV contrast. Would benefit from Urology consultation in the near future.   6. Elevated troponin: Concern for NSTEMI. Cardiology following. Agree with abstaining from contrast for cath for the time being.   7. Diabetes: Per primary team. Hgb A1c 10.0, worse from hgb A1c 8.1 07/16/15. Home regimen of tradjenta and novolog 70/30 80 U with breakfast, 72 U with dinner. Improved CBGs on 10 U lantus BID, meal coverage and SSI.  Olene Floss, MD [place], PGY-2 11/26/2015, 9:39 AM   .I have seen and examined this patient and agree with plan and assessment in the above note with renal recommendations/intervention highlighted.  Additional lab results have returned from his earlier renal eval this admission (done because rate of progression of his renal disease seemed faster than expected) and  free kappa light chain are elevated in the 1000's, and the ratio of kappa to lambda is close to 10. Will obtain 24 hour urine for light chains, obtain skeletal survey, ask oncology to render an opinion.  Other plans as above  Jaydence Vanyo B,MD 11/26/2015 10:42 AM

## 2015-11-26 NOTE — Patient Care Conference (Signed)
Inpatient RehabilitationTeam Conference and Plan of Care Update Date: 11/25/2015   Time: 2:25 PM    Patient Name: Dillon Thomas      Medical Record Number: [id number]  Date of Birth: [birth year] Sex: Male         Room/Bed: 4W07C/4W07C-01 Payor Info: Payor: MEDICARE RAILROAD / Plan: MEDICARE RAILROAD / Product Type: *No Product type* /    Admitting Diagnosis: Debility DKA  Admit Date/Time:  11/23/2015  6:27 PM Admission Comments: No comment available   Primary Diagnosis:  Debility Principal Problem: Debility  Patient Active Problem List   Diagnosis Date Noted  . Fluid overload   . DM type 2 with diabetic peripheral neuropathy ([place])   . Left sided chest pain   . Sleep disturbance   . Hypoalbuminemia due to protein-calorie malnutrition ([place])   . Pleural effusion   . Stage 3 chronic kidney disease   . Essential hypertension   . Poorly controlled type 2 diabetes mellitus with peripheral neuropathy ([place])   . Rib pain on left side   . Debility 11/23/2015  . Debilitated 11/23/2015  . Anemia of chronic disease   . Renal mass   . Hyperlipidemia   . Acute blood loss anemia   . Benign essential HTN   . Diabetic ketoacidosis with coma associated with type 2 diabetes mellitus ([place])   . Hemangioma   . History of TIA (transient ischemic attack)   . Hyperglycemia   . Hypothyroidism   . Lymphocytosis   . NSTEMI (non-ST elevated myocardial infarction) ([place])   . Spondylosis of cervical region without myelopathy or radiculopathy   . Uncontrolled type 2 diabetes mellitus with peripheral neuropathy ([place])   . Skin tear of left elbow without complication   . Pressure injury of skin 11/16/2015  . Obtunded   . Right sided weakness   . Altered mental status 11/15/2015  . Acute metabolic encephalopathy [id number]  . AKI (acute kidney injury) ([place]) 11/15/2015  . CKD (chronic kidney disease) stage 3, GFR 30-59 ml/min 11/15/2015  . Anemia 11/15/2015  . Hyponatremia 11/15/2015  . Hyperosmolar  (nonketotic) coma ([place]) 11/15/2015  . Elevated troponin 11/15/2015  . Tachycardia 11/15/2015  . Accelerated hypertension 11/15/2015    Expected Discharge Date: Expected Discharge Date: 12/04/15  Team Members Present: Physician leading conference: Dr. Delice Lesch Social Worker Present: Alfonse Alpers, LCSW Nurse Present: Heather Roberts, RN PT Present: Jorge Mandril, PT OT Present: Benay Pillow, OT SLP Present: Weston Anna, SLP PPS Coordinator present : Daiva Nakayama, RN, CRRN     Current Status/Progress Goal Weekly Team Focus  Medical   Debilitation/acute encephalopathy secondary to DKA/NSTEMI--plan cardiac catheterization 2-4 weeks per Dr. Einar Gip  CAD, CKD, CP, DM, anemia, pleural effusion, sleep, poor activity, poor motivation  See above   Bowel/Bladder   Cont of B&B-uses feamle urinal, LBM 11/7  To remain cont of B&B  [place] B&B function    Swallow/Nutrition/ Hydration             ADL's   min - mod A (LB self care) and pt very fatigued by end of session  Mod I overall, supervision - shower transfer  endurance, strengthening, self care retraining, pt education   Mobility   S stand pivot transfers, S gait x40', stairs NT on eval due to pain/fatigue  modI overall in home, S stairs and community gait  activity tolerance, gait and transfer training, pain management   Communication             Safety/Cognition/  Behavioral Observations            Pain   C/o gen. pain. Vicodin 1 tab given q4hrs prn  <3  Assess and treat pain as needed during q shift   Skin   Unstageable sacrum-foam, DTI-heels, Gen. bruisng   No new skin breakdown/infection  Assess and monitor skin during q shift    Rehab Goals Patient on target to meet rehab goals: Yes Rehab Goals Revised: none, pt's first conference *See Care Plan and progress notes for long and short-term goals.  Barriers to Discharge: Mulitple medical issues, motivation, sleep, effusion    Possible Resolutions to Barriers:  Diuresis,  therapies, Neprho recs, Cards recs, optimize Bp and DM meds    Discharge Planning/Teaching Needs:  Pt to return home where his girlfriend will assist him as needed.  Santiago Glad, girlfriend can come for family education closer to d/c.   Team Discussion:  Pt with a lot going on medically - kidney disease, increased BP, elevated blood sugars.  Dr. Posey Pronto has consulted cardiology due to above and now chest pain.  Dr. Posey Pronto is making medication adjustments and watching his kidneys.  If pt has chest pain, then stop therapy session.  Pt was S with OT and showered today with S/steady A, but then he was completely wiped out.  PT feels pt does well when he participates and is min A with 40' ambulation with mod I goals.  RN asked for boots for skin on heel breakdown.  Revisions to Treatment Plan:  none   Continued Need for Acute Rehabilitation Level of Care: The patient requires daily medical management by a physician with specialized training in physical medicine and rehabilitation for the following conditions: Daily direction of a multidisciplinary physical rehabilitation program to ensure safe treatment while eliciting the highest outcome that is of practical value to the patient.: Yes Daily medical management of patient stability for increased activity during participation in an intensive rehabilitation regime.: Yes Daily analysis of laboratory values and/or radiology reports with any subsequent need for medication adjustment of medical intervention for : Cardiac problems;Pulmonary problems;Diabetes problems;Wound care problems;Blood pressure problems  Felishia Wartman, Silvestre Mesi 11/26/2015, 2:27 AM

## 2015-11-26 NOTE — Progress Notes (Signed)
Occupational Therapy Note  Patient Details  Name: Dillon Thomas MRN: [id number] Date of Birth: 07/07/1950   Pt's plan of care adjusted to 15/7 after speaking with care team and discussed with PA  as pt currently unable to tolerate current therapy schedule with OT and PT.   Darleen Crocker P 11/26/2015, 9:32 AM

## 2015-11-26 NOTE — Progress Notes (Signed)
Occupational Therapy Note  Patient Details  Name: Dillon Thomas MRN: [id number] Date of Birth: [birth year]  Today's Date: 11/26/2015 OT Missed Time: 75 Minutes Missed Time Reason: CT/MRI   Pt taken off floor for scan during scheduled therapy time.  75 missed minutes of OT intervention this session.    Darleen Crocker P 11/26/2015, 2:26 PM

## 2015-11-26 NOTE — Progress Notes (Signed)
[place] PHYSICAL MEDICINE & REHABILITATION     PROGRESS NOTE  Subjective/Complaints:  Pt laying in  bed this AM.  She states he slept better overnight.  He also notes his CP has slightly improved.   ROS:  +Left chest discomfort.  Denies SOB, N/V/D.  Objective: Vital Signs: Blood pressure (!) 157/58, pulse 61, temperature 97.8 F (36.6 C), temperature source Oral, resp. rate 18, height 5\' 11"  (1.803 m), weight 98.2 kg (216 lb 6.4 oz), SpO2 98 %. Dg Chest 2 View  Result Date: 11/24/2015 CLINICAL DATA:  Fluid overload, pain left posterior chest region EXAM: CHEST  2 VIEW COMPARISON:  11/15/2015, 01/09/2013 FINDINGS: Mildly elevated left diaphragm. On the lateral view, there is hazy posterior lung opacification possibly related to an effusion, likely on the left side. Heart size is normal. Pulmonary vascularity within normal limits. No pneumothorax. IMPRESSION: Probable left-sided pleural effusion, best seen on lateral view with atelectasis or infiltrate at the left lung base. Electronically Signed   By: Donavan Foil M.D.   On: 11/24/2015 22:10    Recent Labs  11/23/15 1948 11/25/15 0739  WBC 10.4 11.2*  HGB 7.3* 9.0*  HCT 22.4* 28.4*  PLT 405* 456*    Recent Labs  11/24/15 0912 11/25/15 0744  NA 136 135  K 4.0 3.9  CL 108 106  GLUCOSE 219* 236*  BUN 57* 57*  CREATININE 3.55* 3.54*  CALCIUM 8.4* 8.4*   CBG (last 3)   Recent Labs  11/24/15 2032 11/25/15 0623 11/25/15 1154  GLUCAP 172* 241* 288*    Wt Readings from Last 3 Encounters:  11/24/15 98.2 kg (216 lb 6.4 oz)  11/18/15 101.4 kg (223 lb 8.7 oz)    Physical Exam:  BP (!) 157/58 (BP Location: Right Arm)   Pulse 61   Temp 97.8 F (36.6 C) (Oral)   Resp 18   Ht 5\' 11"  (1.803 m)   Wt 98.2 kg (216 lb 6.4 oz)   SpO2 98%   BMI 30.18 kg/m  Constitutional: He appears well-developed. NAD. Obese. HENT: Normocephalicand atraumatic.  Eyes: EOMare normal. No discharge. Cardiovascular: RRR. No  JVD. Respiratory: Effort normaland breath sounds normal. No respiratory distress.  GI: Soft. Bowel sounds are normal. He exhibits no distension.  Musculoskeletal: He exhibits edema. He exhibits no tenderness.  Neurological: He is alert.  Delay in processing. Motor: LUE: 4+/5 proximal to distal RUE: 4+/5 proximal to distal RLE: hip flexion 4+/5, knee extension 4+/5, ankle dorsi/plantar flexion 5/5 LLE; Hip flexion 4/5, knee extension 4+/5, ankle dorsi/plantar flexion 5/5(stable) Skin: Skin tear on left forearmhealing. Reported sacral ulcer. Psychiatric: Altered mood and affect and behavior.   Assessment/Plan: 1. Functional deficits secondary to debilitation which require 3+ hours per day of interdisciplinary therapy in a comprehensive inpatient rehab setting. Physiatrist is providing close team supervision and 24 hour management of active medical problems listed below. Physiatrist and rehab team continue to assess barriers to discharge/monitor patient progress toward functional and medical goals.  Function:  Bathing Bathing position   Position: Shower  Bathing parts Body parts bathed by patient: Right arm, Left arm, Chest, Abdomen, Right upper leg, Left upper leg, Front perineal area Body parts bathed by helper: Buttocks, Right lower leg, Left lower leg, Back  Bathing assist Assist Level:  (mod A)      Upper Body Dressing/Undressing Upper body dressing   What is the patient wearing?: Hospital gown     Pull over shirt/dress - Perfomed by patient: Thread/unthread right sleeve, Thread/unthread left  sleeve, Pull shirt over trunk Pull over shirt/dress - Perfomed by helper: Put head through opening        Upper body assist Assist Level: Set up      Lower Body Dressing/Undressing Lower body dressing   What is the patient wearing?: Non-skid slipper socks     Pants- Performed by patient: Pull pants up/down Pants- Performed by helper: Thread/unthread right pants leg,  Thread/unthread left pants leg   Non-skid slipper socks- Performed by helper: Don/doff left sock, Don/doff right sock                  Lower body assist Assist for lower body dressing: Touching or steadying assistance (Pt > 75%)      Toileting Toileting Toileting activity did not occur: No continent bowel/bladder event   Toileting steps completed by helper: Performs perineal hygiene, Adjust clothing prior to toileting, Adjust clothing after toileting Toileting Assistive Devices: Grab bar or rail  Toileting assist Assist level: Supervision or verbal cues   Transfers Chair/bed transfer   Chair/bed transfer method: Ambulatory Chair/bed transfer assist level: Supervision or verbal cues Chair/bed transfer assistive device: Armrests, Medical sales representative     Max distance: 80 Assist level: Supervision or verbal cues   Wheelchair   Type: Manual Max wheelchair distance: 150 Assist Level: Supervision or verbal cues  Cognition Comprehension Comprehension assist level: Follows complex conversation/direction with no assist  Expression Expression assist level: Expresses basic needs/ideas: With no assist  Social Interaction Social Interaction assist level: Interacts appropriately 75 - 89% of the time - Needs redirection for appropriate language or to initiate interaction.  Problem Solving Problem solving assist level: Solves basic problems with no assist  Memory Memory assist level: More than reasonable amount of time     Medical Problem List and Plan: 1.  Debilitation/acute encephalopathy secondary to DKA/NSTEMI--plan cardiac catheterization 2-4 weeks per Dr. Einar Gip  Cont CIR, will need to monitor CP with increased activity  Appreciate Cards recs 2.  DVT Prophylaxis/Anticoagulation: Subcutaneous heparin. Monitor platelet counts and any signs of bleeding 3. Pain Management: Hydrocodone as needed 4. Mood: Aricept 10 mg daily at bedtime 5. Neuropsych: This patient is  capable of making decisions on his own behalf. 6. Skin/Wound Care: Routine skin checks 7. Fluids/Electrolytes/Nutrition: Routine I&Os 8. CKD-3 with baseline creatinine 2.00. Renal service continued to follow with workup ongoing. Demadex initiated 11/20/1998 1740 mg twice a day.   Cr. 3.54 on 11/8  Appreciate nephro recs 9. Uncontrolled hypertension.   Coreg 12.5 mg twice a day, increased to 25 on 11/8  Improving, will consider additional medications tomorrow 10. Diabetes mellitus with peripheral neuropathy. Hemoglobin A1c of 10. Home regimen of trandjenta and NovoLog 70/30 80 units at breakfast, 72 units with dinner  Lantus insulin 10 units twice a day.   Check blood sugars before meals and at bedtime.   Appear to be elevated, however, no recordings in last 10 hours 11. Hyperlipidemia. Crestor 12. Hypothyroidism.TSH 2.954. Continue synthroid 13. Acute on chronic anemia.   Continue Aranesp, FeSO4/Vit C added 11/8  Hb 9.0 on 11/8  Will cont to monitor  Labs pending 14. Cervical spondylosis: Monitor 15. Leukocytosis:   WBCs 11.2 on 11/8  Cont to monitor  Labs pending  UA/Ucx ordered  Will consider repeat CXR 16. Left chest discomfort  Angina +/- pleural effusion  ECG reviewed, troponin reviewed, improved 17. Sleep disturbance  Trazodone 50 PRN, increased to 100 on 11/8  Improving 18. Hypoalbuminemia  Supplement started 11/8 19. Pleural effusion  Cont diuresis, protein supplementation  LOS (Days) 3 A FACE TO FACE EVALUATION WAS PERFORMED  Lavaeh Bau Lorie Phenix 11/26/2015 8:33 AM

## 2015-11-26 NOTE — Progress Notes (Signed)
[facility] Individual Statement of Services  Patient Name:  [name]  Date:  11/26/2015  Welcome to the [facility].  Our goal is to provide you with an individualized program based on your diagnosis and situation, designed to meet your specific needs.  With this comprehensive rehabilitation program, you will be expected to participate in at least 3 hours of rehabilitation therapies Monday-Friday, with modified therapy programming on the weekends.  Your rehabilitation program will include the following services:  Physical Therapy (PT), Occupational Therapy (OT), 24 hour per day rehabilitation nursing, Case Management (Social Worker), Rehabilitation Medicine, Nutrition Services and Pharmacy Services  Weekly team conferences will be held on Wednesdays to discuss your progress.  Your Social Worker will talk with you frequently to get your input and to update you on team discussions.  Team conferences with you and your family in attendance may also be held.  Expected length of stay: 10 days  Overall anticipated outcome: Intermittent supervision/assist  Depending on your progress and recovery, your program may change. Your Social Worker will coordinate services and will keep you informed of any changes. Your Social Worker's name and contact numbers are listed  below.  The following services may also be recommended but are not provided by the [facility] will be made to provide these services after discharge if needed.  Arrangements include referral to agencies that provide these services.  Your insurance has been verified to be:  [employer] and secondary insurance Your primary doctor is:  Dr. Guy Begin Olmedo  Pertinent information will be shared with your doctor and your insurance company.  Social Worker:   Alfonse Alpers, LCSW  [phone] or [phone]  Information discussed with and copy given to patient by: Trey Sailors, 11/26/2015, 2:19 AM

## 2015-11-26 NOTE — Progress Notes (Signed)
Physical Therapy Note  Patient Details  Name: Dillon Thomas MRN: [id number] Date of Birth: 1950/09/15 Today's Date: 11/26/2015    Time: 1100  Pt missed 60 minutes skilled PT due to refusal. PT attempted to encourage pt and educated on the importance of mobility, pt continues to refuse.   Harshith Pursell 11/26/2015, 12:41 PM

## 2015-11-26 NOTE — Progress Notes (Signed)
Physical Therapy Session Note  Patient Details  Name: KEOKI LANOUE MRN: [id number] Date of Birth: [birth year]  Today's Date: 11/25/2015 PT Individual Time: 1630-1700 PT Individual Time Calculation: 30 min       Short Term Goals: Week 1:  PT Short Term Goal 1 (Week 1): =LTG due to estimated LOS  Skilled Therapeutic Interventions/Progress Updates:     PT instructed patient in car transfer with min Assist for safety and moderate cues for proper technique and PR placement. Patient required prolonged break after transfer due to pain and fatigue.   Patient returned to room and performed stand pivot transfer to bed with RW with min assist. Sit<>stand with min assist to attempt urination in urinal, but unsuccessful.   Sit>supine with mod Assist for BLE management due to fatigue.  Patient left supine in bed with RN present.   Therapy Documentation Precautions:  Precautions Precautions: Fall Restrictions Weight Bearing Restrictions: No General:   Vital Signs: Therapy Vitals Temp: 97.8 F (36.6 C) Temp Source: Oral Pulse Rate: 61 Resp: 18 BP: (!) 157/58 Patient Position (if appropriate): Lying Oxygen Therapy SpO2: 98 % O2 Device: Not Delivered   See Function Navigator for Current Functional Status.   Therapy/Group: Individual Therapy  Lorie Phenix 11/26/2015, 6:21 AM

## 2015-11-27 ENCOUNTER — Inpatient Hospital Stay (HOSPITAL_COMMUNITY): Payer: PRIVATE HEALTH INSURANCE | Admitting: Physical Therapy

## 2015-11-27 ENCOUNTER — Inpatient Hospital Stay (HOSPITAL_COMMUNITY): Payer: PRIVATE HEALTH INSURANCE | Admitting: Occupational Therapy

## 2015-11-27 ENCOUNTER — Inpatient Hospital Stay (HOSPITAL_COMMUNITY): Payer: MEDICARE | Admitting: Physical Therapy

## 2015-11-27 DIAGNOSIS — I150 Renovascular hypertension: Secondary | ICD-10-CM

## 2015-11-27 DIAGNOSIS — R768 Other specified abnormal immunological findings in serum: Secondary | ICD-10-CM

## 2015-11-27 DIAGNOSIS — S32592A Other specified fracture of left pubis, initial encounter for closed fracture: Secondary | ICD-10-CM

## 2015-11-27 LAB — CBC
HEMATOCRIT: 27.2 % — AB (ref 39.0–52.0)
Hemoglobin: 8.6 g/dL — ABNORMAL LOW (ref 13.0–17.0)
MCH: 28.5 pg (ref 26.0–34.0)
MCHC: 31.6 g/dL (ref 30.0–36.0)
MCV: 90.1 fL (ref 78.0–100.0)
Platelets: 371 10*3/uL (ref 150–400)
RBC: 3.02 MIL/uL — ABNORMAL LOW (ref 4.22–5.81)
RDW: 15.5 % (ref 11.5–15.5)
WBC: 11.6 10*3/uL — ABNORMAL HIGH (ref 4.0–10.5)

## 2015-11-27 LAB — RENAL FUNCTION PANEL
Albumin: 1.7 g/dL — ABNORMAL LOW (ref 3.5–5.0)
Anion gap: 10 (ref 5–15)
BUN: 68 mg/dL — AB (ref 6–20)
CHLORIDE: 108 mmol/L (ref 101–111)
CO2: 20 mmol/L — ABNORMAL LOW (ref 22–32)
Calcium: 8.4 mg/dL — ABNORMAL LOW (ref 8.9–10.3)
Creatinine, Ser: 3.61 mg/dL — ABNORMAL HIGH (ref 0.61–1.24)
GFR calc Af Amer: 19 mL/min — ABNORMAL LOW (ref >60)
GFR calc non Af Amer: 16 mL/min — ABNORMAL LOW (ref >60)
GLUCOSE: 108 mg/dL — AB (ref 65–99)
POTASSIUM: 3.7 mmol/L (ref 3.5–5.1)
Phosphorus: 4.7 mg/dL — ABNORMAL HIGH (ref 2.5–4.6)
Sodium: 138 mmol/L (ref 135–145)

## 2015-11-27 LAB — URINE CULTURE

## 2015-11-27 LAB — GLUCOSE, CAPILLARY
GLUCOSE-CAPILLARY: 109 mg/dL — AB (ref 65–99)
Glucose-Capillary: 235 mg/dL — ABNORMAL HIGH (ref 65–99)
Glucose-Capillary: 204 mg/dL — ABNORMAL HIGH (ref 65–99)
Glucose-Capillary: 189 mg/dL — ABNORMAL HIGH (ref 65–99)

## 2015-11-27 MED ORDER — INSULIN GLARGINE 100 UNIT/ML ~~LOC~~ SOLN
10.0000 [IU] | Freq: Every day | SUBCUTANEOUS | Status: DC
  Administered 2015-11-28 – 2015-12-01 (×4): 10 [IU] via SUBCUTANEOUS
  Filled 2015-11-27 (×5): qty 0.1

## 2015-11-27 NOTE — Progress Notes (Signed)
I received consult regarding this patient's abnormal I reviewed the test results with the resident on service I recommend follow-up in the outpatient clinic on November 28. I have sent my scheduler a message to arrange for this outpatient follow-up appointment

## 2015-11-27 NOTE — Progress Notes (Addendum)
Physical Therapy Session Note  Patient Details  Name: Dillon Thomas MRN: [id number] Date of Birth: [birth year]  Today's Date: 11/27/2015 PT Individual Time: 1010-1108 PT Individual Time Calculation (min): 58 min    Short Term Goals: Week 1:  PT Short Term Goal 1 (Week 1): =LTG due to estimated LOS  Skilled Therapeutic Interventions/Progress Updates:  Pt received in bed completely supine.  LUE noted to be bleeding and pt reporting that bone scan from yesterday found fractures in ribs and pelvis.  Discussed findings and mobility restrictions with RN who reports pt has no restrictions.  Applied bandage and elbow protector to LUE.  Pt performed supine > sit with use of bed controls, bed rails and min A to lift one LE and extra time to sit EOB.  Assessed vitals EOB.  Pt performed stand pivot to w/c with RW and min A.  Transported to gym in w/c for energy conservation.  Discussed stairs at home and gravel driveway to back entrance, also discussed options for AD at home.  Pt reports he had a RW but wasn't sure where it was.  Pt would like for therapist to order him another RW; discussed option of RW vs. rollator and use of seat for rest breaks and energy conservation.  Performed gait over compliant surfaces x 2 reps, once with RW and then again with rollator with min A and verbal cues for safe sequence.  Pt more stable and more efficient with rollator but wished to continue to train with RW.  Pt returned to room and performed stand pivot to bed with RW and min A.  Required mod A for sit > supine and to reposition in bed.  Pt left with SRx4 and all items within reach.  Therapy Documentation Precautions:  Precautions Precautions: Fall Restrictions Weight Bearing Restrictions: No Vital Signs: Therapy Vitals Pulse Rate: 67 BP: (!) 126/58 Patient Position (if appropriate): Sitting Oxygen Therapy SpO2: 99 % O2 Device: Not Delivered Pain: Pain Assessment Pain Score: 4  Pain Type: Acute pain Pain  Location: Rib cage Pain Orientation: Left Pain Descriptors / Indicators: Discomfort Pain Onset: With Activity Pain Intervention(s): Rest   See Function Navigator for Current Functional Status.   Therapy/Group: Individual Therapy  Raylene Everts Faucette 11/27/2015, 12:15 PM

## 2015-11-27 NOTE — Progress Notes (Addendum)
[facility] PHYSICAL MEDICINE & REHABILITATION     PROGRESS NOTE  Subjective/Complaints:  Pt laying in bed this AM.  He states he didn't sleep well overnight.  He has questions about his bone scan results.  Seen by Nephro, workup ordered.  ROS:  Denies SOB, N/V/D.  Objective: Vital Signs: Blood pressure (!) 165/72, pulse 76, temperature 97.9 F (36.6 C), temperature source Oral, resp. rate 18, height 5' 11"  (1.803 m), weight 98.2 kg (216 lb 6.4 oz), SpO2 99 %. Dg Bone Survey Met  Addendum Date: 11/26/2015   ADDENDUM REPORT: 11/26/2015 18:37 ADDENDUM: Bilateral twelfth rib fractures noted. Electronically Signed   By: Nolon Nations M.D.   On: 11/26/2015 18:37   Result Date: 11/26/2015 CLINICAL DATA:  Elevated serum immunoglobulin free light chains. Pt could not stand or roll onto his side. Best obtainable images due to pt condition. EXAM: METASTATIC BONE SURVEY COMPARISON:  Chest x-ray 11/24/2015, abdominal film 09/16/2015 FINDINGS: Images of the axial and appendicular skeleton are performed. There are acute fractures of the left fourth and fifth ribs. Heart size is normal. There is dense opacity in the left lower lobe and left pleural effusion. Evaluation of the spine is limited by limited patient mobility. Degenerative changes are identified in the mid cervical spine. There is no acute fracture identified. Degenerative changes are identified throughout the thoracic and lumbar spine. There is vertebral body height loss at L3, likely chronic based on its appearance. Deformity of the left superior pubic ramus and possibly left inferior pubic ramus consistent with fracture. No suspicious lytic or blastic lesions are identified. There is dense atherosclerotic calcification of the imaged vessels. Incidental note of subcutaneous edema. Surgical clips are noted in the right upper quadrant of the abdomen. IMPRESSION: 1. No suspicious lytic or blastic lesions. 2. Acute fractures of the left fourth and fifth  ribs. 3. Acute fractures of the left superior and possibly inferior pubic ramus. 4. Atherosclerotic disease.  Subcutaneous edema. Electronically Signed: By: Nolon Nations M.D. On: 11/26/2015 15:29    Recent Labs  11/26/15 0802 11/27/15 0702  WBC 11.6* 11.6*  HGB 9.5* 8.6*  HCT 29.5* 27.2*  PLT 399 371    Recent Labs  11/26/15 0802 11/27/15 0702  NA 136 138  K 3.6 3.7  CL 106 108  GLUCOSE 122* 108*  BUN 63* 68*  CREATININE 3.54* 3.61*  CALCIUM 8.4* 8.4*   CBG (last 3)   Recent Labs  11/25/15 2036 11/26/15 0631 11/26/15 1145  GLUCAP 333* 114* 168*    Wt Readings from Last 3 Encounters:  11/24/15 98.2 kg (216 lb 6.4 oz)  11/18/15 101.4 kg (223 lb 8.7 oz)    Physical Exam:  BP (!) 165/72 (BP Location: Right Arm)   Pulse 76   Temp 97.9 F (36.6 C) (Oral)   Resp 18   Ht 5' 11"  (1.803 m)   Wt 98.2 kg (216 lb 6.4 oz)   SpO2 99%   BMI 30.18 kg/m  Constitutional: He appears well-developed. NAD. Obese. HENT: Normocephalicand atraumatic.  Eyes: EOMare normal. No discharge. Cardiovascular: RRR. No JVD. Respiratory: Effort normaland breath sounds normal. No respiratory distress.  GI: Soft. Bowel sounds are normal. He exhibits no distension.  Musculoskeletal: He exhibits edema. He exhibits no tenderness.  Neurological: He is alert.  Delay in processing. Motor: LUE: 4+/5 proximal to distal (stable) RUE: 4+/5 proximal to distal RLE: hip flexion 4+/5, knee extension 4+/5, ankle dorsi/plantar flexion 5/5 LLE; Hip flexion 4/5, knee extension 4+/5, ankle dorsi/plantar  flexion 5/5(unchanged) Skin: Reported sacral ulcer. Psychiatric: Mood, affect, and behavior, relatively normal.  Assessment/Plan: 1. Functional deficits secondary to debilitation which require 3+ hours per day of interdisciplinary therapy in a comprehensive inpatient rehab setting. Physiatrist is providing close team supervision and 24 hour management of active medical problems listed  below. Physiatrist and rehab team continue to assess barriers to discharge/monitor patient progress toward functional and medical goals.  Function:  Bathing Bathing position   Position: Shower  Bathing parts Body parts bathed by patient: Right arm, Left arm, Chest, Abdomen, Right upper leg, Left upper leg, Front perineal area Body parts bathed by helper: Buttocks, Right lower leg, Left lower leg, Back  Bathing assist Assist Level:  (mod A)      Upper Body Dressing/Undressing Upper body dressing   What is the patient wearing?: Hospital gown     Pull over shirt/dress - Perfomed by patient: Thread/unthread right sleeve, Thread/unthread left sleeve, Pull shirt over trunk Pull over shirt/dress - Perfomed by helper: Put head through opening        Upper body assist Assist Level: Set up      Lower Body Dressing/Undressing Lower body dressing   What is the patient wearing?: Non-skid slipper socks     Pants- Performed by patient: Pull pants up/down Pants- Performed by helper: Thread/unthread right pants leg, Thread/unthread left pants leg   Non-skid slipper socks- Performed by helper: Don/doff left sock, Don/doff right sock                  Lower body assist Assist for lower body dressing: Touching or steadying assistance (Pt > 75%)      Toileting Toileting Toileting activity did not occur: No continent bowel/bladder event   Toileting steps completed by helper: Performs perineal hygiene, Adjust clothing prior to toileting, Adjust clothing after toileting Toileting Assistive Devices: Grab bar or rail  Toileting assist Assist level: Supervision or verbal cues   Transfers Chair/bed transfer   Chair/bed transfer method: Ambulatory Chair/bed transfer assist level: Supervision or verbal cues Chair/bed transfer assistive device: Armrests, Medical sales representative     Max distance: 80 Assist level: Supervision or verbal cues   Wheelchair   Type: Manual Max  wheelchair distance: 150 Assist Level: Supervision or verbal cues  Cognition Comprehension Comprehension assist level: Follows complex conversation/direction with no assist  Expression Expression assist level: Expresses basic needs/ideas: With no assist  Social Interaction Social Interaction assist level: Interacts appropriately 75 - 89% of the time - Needs redirection for appropriate language or to initiate interaction.  Problem Solving Problem solving assist level: Solves basic problems with no assist  Memory Memory assist level: More than reasonable amount of time     Medical Problem List and Plan: 1.  Debilitation/acute encephalopathy secondary to DKA/NSTEMI--plan cardiac catheterization 2-4 weeks per Dr. Einar Gip  Cont CIR, will need to monitor CP with increased activity  Appreciate Cards recs 2.  DVT Prophylaxis/Anticoagulation: Subcutaneous heparin. Monitor platelet counts and any signs of bleeding 3. Pain Management: Hydrocodone as needed 4. Mood: Aricept 10 mg daily at bedtime 5. Neuropsych: This patient is capable of making decisions on his own behalf. 6. Skin/Wound Care: Sacral ulcer per report, pt noncompliant with pressure relief. 7. Fluids/Electrolytes/Nutrition: Routine I&Os 8. CKD-3 with baseline creatinine 2.00. Renal service continued to follow with workup ongoing. Demadex initiated 11/20/1998 1740 mg twice a day.   Cr. 3.61 on 11/10, ?new baseline  Appreciate nephro recs 9. Uncontrolled hypertension.   Coreg  12.5 mg twice a day, increased to 25 on 11/8  Being managed by Neprho, appreciate recs 10. Diabetes mellitus with peripheral neuropathy. Hemoglobin A1c of 10. Home regimen of trandjenta and NovoLog 70/30 80 units at breakfast, 72 units with dinner  Lantus insulin 10 units twice a day, changed to daily on 11/10.   Novolog 4U TID, increased to 8U TID on 11/10  Check blood sugars before meals and at bedtime.   Will cont to monitor 11. Hyperlipidemia. Crestor 12.  Hypothyroidism.TSH 2.954. Continue synthroid 13. Acute on chronic anemia.   Continue Aranesp, FeSO4/Vit C added 11/8  Hb 8.6 on 11/10  Will cont to monitor 14. Cervical spondylosis: Monitor 15. Leukocytosis:   WBCs 11.6 on 11/10 (stable)  Cont to monitor  UA with rare bacteria, Ucx pending  Will consider repeat CXR if necessary 16. Left chest discomfort  Improving  Angina +/- pleural effusion  ECG reviewed, troponin reviewed, improved 17. Sleep disturbance  Trazodone 50 PRN, increased to 100 on 11/8  Improving 18. Hypoalbuminemia   Supplement started 11/8 19. Pleural effusion  Diuresis increased by Nephro  Protein supplementation 20. Left rib fractures  On bone scan, results reviewed, conservative care 21. Left pubic rami fractures  On bone scan, results reviewed, will consider Ortho eval 22. Elevated light chains  Recs per Nephro  LOS (Days) 4 A FACE TO FACE EVALUATION WAS PERFORMED  Ankit Lorie Phenix 11/27/2015 8:38 AM

## 2015-11-27 NOTE — Progress Notes (Signed)
Occupational Therapy Session Note  Patient Details  Name: Dillon Thomas MRN: [id number] Date of Birth: 01-29-1950  Today's Date: 11/27/2015 OT Individual Time: [id number] OT Individual Time Calculation (min): 61 min   Short Term Goals: Week 1:  OT Short Term Goal 1 (Week 1): STGs=LTGs based on short estimated LOS      Skilled Therapeutic Interventions/Progress Updates:   Skilled OT session completed with focus on active participation, self care mgt retraining, and use of AE. Pt was lying supine in bed at time of arrival and with encouragement, was agreeable to shower and dress. Stand pivot to w/c completed with RW and min assist. ADL items were gathered w/c level per pt request. Pt required Min A for bench transfer and Mod A overall for shower. Dressing completed w/c level at sink with use of reacher. With education, pt was agreeable to don mesh underwear with pad due to concerns regarding incontinence. Per nursing, pt is not to wear brief due to ulcers. Pt would benefit from [facility] sponge and sock aide training to decrease level of assistance at later session. Pt expressed interest in learning about AE. Pt was returned to bed at end of session, TED socks donned and all needs within reach at time of departure.   Therapy Documentation Precautions:  Precautions Precautions: Fall Restrictions Weight Bearing Restrictions: No General: General PT Missed Treatment Reason: Other (Comment) (Pt request to finish eating ) Vital Signs: Therapy Vitals Temp: 97.8 F (36.6 C) Temp Source: Oral Pulse Rate: 71 Resp: 18 BP: (!) 144/61 Patient Position (if appropriate): Lying Oxygen Therapy SpO2: 97 % O2 Device: Not Delivered Pain: Pt reported pain to be manageable during session Pain Assessment Pain Assessment: 0-10 Pain Score: Asleep Pain Type: Acute pain Pain Location: Buttocks Pain Orientation: Left Pain Descriptors / Indicators: Aching Pain Frequency: Constant Pain Onset:  On-going Patients Stated Pain Goal: 3 Pain Intervention(s): Medication (See eMAR)     See Function Navigator for Current Functional Status.   Therapy/Group: Individual Therapy  Sofia Vanmeter A Azizah Lisle 11/27/2015, 4:01 PM

## 2015-11-27 NOTE — Progress Notes (Signed)
Spoke with on call Oncologist Dr. Alvy Bimler who reviewed patient's results. She explained that a kappa/lambda light chain ratio up to 10 is not to be unexpected in progressive renal failure. She said that 24 hour quantitative light chain collection with immunofixation would be helpful, though may need to be repeated in outpatient setting. She offered follow-up with her in clinic later this month to determine if further work-up needed. No biopsies indicated at this time.  Olene Floss, MD [facility], PGY-2

## 2015-11-27 NOTE — Progress Notes (Signed)
[facility] Progress Note     Subjective:   Did not sleep well overnight. Bottom is still painful for him, as is L chest wall.    Objective:   BP (!) 165/72 (BP Location: Right Arm)   Pulse 76   Temp 97.9 F (36.6 C) (Oral)   Resp 18   Ht 5\' 11"  (1.803 m)   Wt 216 lb 6.4 oz (98.2 kg)   SpO2 99%   BMI 30.18 kg/m   Intake/Output Summary (Last 24 hours) at 11/27/15 0757 Last data filed at 11/27/15 0747  Gross per 24 hour  Intake              240 ml  Output             1500 ml  Net            -1260 ml   Net negative 3.6 since admission.   Physical Exam: General: Overweight male, lying in bed, about to get up for PT Cardiac: RRR, S1, S2, no m/r/g Lungs: Lungs CTAB, no increased WOB MSK: 1+ pitting edema over shins and trace to thighs; mild TTP over L CVA and midline over thoracic spine  Neuro: No asterixis, AOx3, moves all extremities spontaneously  Imaging: US Renal  Result Date: 11/19/2015 CLINICAL DATA:  Acute renal insufficiency EXAM: RENAL / URINARY TRACT ULTRASOUND COMPLETE COMPARISON:  None. FINDINGS: Right Kidney: Length: 12.2 cm. Echogenicity is increased. Renal cortical thickness is normal. No perinephric fluid or hydronephrosis visualized. There is a cystic mass arising from the upper pole of the right kidney measuring 2.5 x 2.8 x 2.2 cm. Within this cystic mass, there is a solid-appearing focus measuring 1.4 x 1.2 x 1.2 cm. There is no sonographically demonstrable calculus or ureterectasis. Left Kidney: Length: 11.6 cm. Echogenicity is increased. Renal cortical thickness is normal. No perinephric fluid or hydronephrosis visualized. There is a cyst arising from the left kidney measuring 3.0 x 2.6 x 2.8 cm. No sonographically demonstrable calculus or ureterectasis. Bladder: Appears normal for degree of bladder distention. IMPRESSION: Kidneys are echogenic consistent with medical renal disease. No obstructing focus identified on either side. There is a  cystic lesion arising from the upper pole of the right kidney which contains a solid focus. This appearance raises concern for potential renal neoplasm with a cystic component. Further evaluation with pre and post contrast MRI or CT should be considered. MRI is preferred in younger patients (due to lack of ionizing radiation) and for evaluating calcified lesion(s). Given the elevated creatinine, MRI may be a better choice for further assessment if there is no contraindication to MR imaging. There is a simple cyst in the left kidney. These results will be called to the ordering clinician or representative by the Radiologist Assistant, and communication documented in the PACS or zVision Dashboard. Electronically Signed   By: Lowella Grip III M.D.   On: 11/19/2015 14:57       Recent Labs Lab 11/22/15 0510 11/23/15 0601 11/23/15 1948 11/24/15 0912 11/25/15 0744 11/26/15 0802 11/27/15 0702  NA 137 138 135 136 135 136 138  K 3.7 3.9 3.7 4.0 3.9 3.6 3.7  CL 108 109 108 108 106 106 108  CO2 21* 19* 19* 21* 18* 20* 20*  GLUCOSE 177* 118* 258* 219* 236* 122* 108*  BUN 51* 56* 61* 57* 57* 63* 68*  CREATININE 3.60* 3.63* 3.60* 3.55* 3.54* 3.54* 3.61*  CALCIUM 7.9* 8.2* 7.9* 8.4* 8.4* 8.4* 8.4*  PHOS  --   --   --  4.4 4.4 4.6 4.7*     Recent Labs Lab 11/23/15 1948 11/25/15 0739 11/26/15 0802 11/27/15 0702  WBC 10.4 11.2* 11.6* 11.6*  NEUTROABS 5.1  --   --   --   HGB 7.3* 9.0* 9.5* 8.6*  HCT 22.4* 28.4* 29.5* 27.2*  MCV 88.5 89.0 89.9 90.1  PLT 405* 456* 399 371    Results for Dillon, Thomas (MRN [id number]) as of 11/26/2015 10:38  Ref. Range 11/20/2015 11:07  Total Protein ELP Latest Ref Range: 6.0 - 8.5 g/dL 5.8 (L)  Albumin ELP Latest Ref Range: 2.9 - 4.4 g/dL 1.9 (L)  Globulin, Total Latest Ref Range: 2.2 - 3.9 g/dL 3.9  A/G Ratio Latest Ref Range: 0.7 - 1.7  0.5 (L)  Alpha-1-Globulin Latest Ref Range: 0.0 - 0.4 g/dL 0.4  Alpha-2-Globulin Latest Ref Range: 0.4 - 1.0 g/dL 0.8   Beta Globulin Latest Ref Range: 0.7 - 1.3 g/dL 0.7  Gamma Globulin Latest Ref Range: 0.4 - 1.8 g/dL 2.0 (H)  M-SPIKE, % Latest Ref Range: Not Observed g/dL 1.4 (H)  SPE Interp. Unknown Comment  Comment Unknown Comment  Kappa free light chain Latest Ref Range: 3.3 - 19.4 mg/L 1,061.4 (H)  Lamda free light chains Latest Ref Range: 5.7 - 26.3 mg/L 106.3 (H)  Kappa, lamda light chain ratio Latest Ref Range: 0.26 - 1.65  9.98 (H)   Medications:    . amLODipine  10 mg Oral Daily  . aspirin EC  81 mg Oral Daily  . carvedilol  25 mg Oral BID WC  . darbepoetin (ARANESP) injection - NON-DIALYSIS  100 mcg Subcutaneous Q Fri-1800  . donepezil  10 mg Oral QHS  . feeding supplement (NEPRO CARB STEADY)  237 mL Oral BID BM  . feeding supplement (PRO-STAT SUGAR FREE 64)  30 mL Oral BID  . ferrous sulfate  325 mg Oral BID WC  . folic acid  1 mg Oral Daily  . insulin aspart  0-15 Units Subcutaneous TID WC  . insulin aspart  0-5 Units Subcutaneous QHS  . insulin aspart  4 Units Subcutaneous TID WC  . insulin glargine  10 Units Subcutaneous BID  . levothyroxine  25 mcg Oral QAC breakfast  . montelukast  10 mg Oral QHS  . pantoprazole  40 mg Oral Daily  . rosuvastatin  20 mg Oral q1800  . sodium bicarbonate  1,300 mg Oral BID  . thiamine  100 mg Oral Daily  . ticagrelor  90 mg Oral BID  . torsemide  20 mg Oral BID  . vitamin C  250 mg Oral BID WC   Background: Dillon Thomas is an 65 y.o. male with PMH of uncontrolled type 2 diabetes mellitus with peripheral neuropathy, CKD-3 baseline creatinine 2.00 (last noted in Feb of 2017-1.9), HTN, BPH, hypothyroidism, depression and TIA. He presented to the hospital after being found down and with AMS; in [place] with CBG > 800 on admission. Brain imaging normal. Elevated troponin, thought to be NSTEMI per Cards. Creatinine 3.48 on admission.  Extensive w/u - findings most consistent with diabetic nephropathy except for elevated light chains.   Assessment/ Plan:     1. Acute on Chronic Renal Failure: SCr has been stable over last several days. Suspect this is A on CRF in the setting of volume depletion on ACE with baseline diabetic nephropathy. However, given possibly nephrotic range protein and hematuria obtained more labs to rule out GN. Last SCr 1.90 in February, so elevated SCr could also represent  chronic worsening in the setting of diabetic nephropathy with poorly controlled illnesses. Initial UA with 6-30 RBC per HPF; repeat 11/2 with TNC RBCs and granular casts. CK WNL. Spot urine protein 760 mg/dL. FENa 1.1%, consistent with intrinsic disease. Albumin 1.5 (was 1.9 on admission), which may represent poor nutritional status vs nephrotic syndrome /intrinsic kidney injury. Serologies: ANA negative. Kappa/lambda light chains elevated, with high ratio (close to 10). ANCA titers negative  C3, C4,  HIV, hepatitis panel negative. Single M spike on protein electrophoresis. Adequate UOP at 1.5L and incompletely charted. Continue torsemide BID. Continue sodium bicarb 1300 mg BID. Will need to establish with a Nephrologist outpatient. Believe he may be at his new baseline. Nephrology will continue to follow.   2. Elevated kappa/lambda light chains, with high ratio (close to 10): Obtained last week due to past progression of renal injury. Bone Survey 11/9 without lytic or blastic lesions. To obtain 24 hour urine light chains, currently being collected. Plan to consult oncology->Dr. Alvy Bimler left note that outpt followup planned for 12/15/15. No additional labs or eval prior to that visit were suggested in the note.  2. HTN: Still elevated with systolic in the [id number] despite carvedilol increase to 25 mg BID and torsemide increase to 20 mg BID 11/8. Added norvasc 10 mg 11/7. Obtain orthostatic vital signs to gauge how aggressive to be with medication increases.   3. Anemia: Trending down. Improved to 9.0 s/p transfusion. 9.5 > 8.6. Hgb as high as 12.5 on 02/11/15 via [place]. Iron studies showed severe deficiency. s/p feraheme 11/2, with repeat dose 11/6. To start aranesp 100 mcg q Friday. Received 1 u pRBCs 11/5 for hgb to 7.3 and again 11/7 was transfused 1 u pRBCs, heparin discontinued 11/7. SCDs ordered. FOBT negative 11/8. Continue to monitor. Urine microscopic with 6-30 RBCs 11/9.   4. Left-sided Chest Wall Pain: Slightly improved. DG chest 11/7 with likely small left-sided pleural effusion with underlying infiltrate/atelectasis. Incentive spirometry. Maintaining O2 sats on RA. Acute fractures of left fourth and fifth ribs on bone scan.   5. Renal cysts: Nonobstructive. MRI showed complex renal cortical mass with mural nodule, concerning for renal cell carcinoma; will need further imaging with IV contrast. Would benefit from Urology consultation in the near future.   6. Elevated troponin: Concern for NSTEMI. Cardiology following. Agree with abstaining from contrast for cath for the time being.   7. Diabetes: Per primary team. Hgb A1c 10.0, worse from hgb A1c 8.1 07/16/15. Home regimen of tradjenta and novolog 70/30 80 U with breakfast, 72 U with dinner. Improved CBGs on 10 U lantus BID, meal coverage and SSI.  8. L pubic ramus fractures: Continue pain management and mobilization with PT. Ortho saw, said to be stable fractures, and recommended outpt followp  Olene Floss, MD [facility], PGY-2 11/27/2015, 7:57 AM   I have seen and examined this patient and agree with plan and assessment in the above note with renal recommendations/intervention highlighted. Renal function stable at new baseline 3.5-3.6 (with concern now about the possibility of light chain deposition - heme to eval as outpt, skeletal survey no suspicious lesions - just rib and pubic ramus fractures), volume status improved/stable on current torsemide BID. Will continue to follow.   Lakrisha Iseman B,MD 11/27/2015 12:50 PM

## 2015-11-27 NOTE — Consult Note (Signed)
I reviewed the xrays and the patient has nondisplaced stable fractures of his rami.  Patient may follow up with me in my office in 2 weeks post hospital discharge.    Azucena Cecil, MD [place] 12:45 PM

## 2015-11-27 NOTE — Progress Notes (Signed)
Tried to reach patient's girlfriend Santiago Glad to give update, per patient's request. No answer at listed telephone number. Will try again tomorrow.

## 2015-11-27 NOTE — Progress Notes (Signed)
Physical Therapy Session Note  Patient Details  Name: Dillon Thomas MRN: [id number] Date of Birth: [birth year]  Today's Date: 11/27/2015 PT Individual Time: 815-905 PT Individual Time Calculation (min): 50 min    Short Term Goals: Week 1:  PT Short Term Goal 1 (Week 1): =LTG due to estimated LOS  Skilled Therapeutic Interventions/Progress Updates:   Pt presented in bed completing breakfast. Agreeable to participate after eating. Performed supine to sit with HOB elevated and use of rails, pt refused/unable to turn to side 2/2 rib pain, performed using lateral scoot.  Additional time required however pt able to perform with supervision. Pt performed sit to stand with CGA and RW. Ambulated approx 75f with RW using inconsistent step to/step through pattern. Pt unable to ambulate 2/2 fatigue. Returned to room, pt transferred to bed in same matter as above. Performed glute sets, hip abd/add, heel slides x 15 on L with additional time required 2/2 pain.  Pt remained in bed with all current needs met.   Therapy Documentation Precautions:  Precautions Precautions: Fall Restrictions Weight Bearing Restrictions: No General: PT Amount of Missed Time (min): 10 Minutes PT Missed Treatment Reason: Other (Comment) (Pt request to finish eating ) Vital Signs: Therapy Vitals Temp: 97.8 F (36.6 C) Temp Source: Oral Pulse Rate: 71 Resp: 18 BP: (!) 144/61 Patient Position (if appropriate): Lying Oxygen Therapy SpO2: 97 % O2 Device: Not Delivered Pain: Pain Assessment Pain Assessment: 0-10 Pain Score: Asleep Pain Type: Acute pain Pain Location: Buttocks Pain Orientation: Left Pain Descriptors / Indicators: Aching Pain Frequency: Constant Pain Onset: On-going Patients Stated Pain Goal: 3 Pain Intervention(s): Medication (See eMAR) Mobility:   Locomotion :    Trunk/Postural Assessment :    Balance:   Exercises:   Other Treatments:     See Function Navigator for Current  Functional Status.   Therapy/Group: Individual Therapy  Kaesen Rodriguez Charell Faulk, PTA  11/27/2015, 3:53 PM

## 2015-11-28 ENCOUNTER — Inpatient Hospital Stay (HOSPITAL_COMMUNITY): Payer: PRIVATE HEALTH INSURANCE | Admitting: Physical Therapy

## 2015-11-28 ENCOUNTER — Inpatient Hospital Stay (HOSPITAL_COMMUNITY): Payer: MEDICARE

## 2015-11-28 ENCOUNTER — Inpatient Hospital Stay (HOSPITAL_COMMUNITY): Payer: PRIVATE HEALTH INSURANCE | Admitting: Occupational Therapy

## 2015-11-28 LAB — CBC
HEMATOCRIT: 28.6 % — AB (ref 39.0–52.0)
Hemoglobin: 9 g/dL — ABNORMAL LOW (ref 13.0–17.0)
MCH: 28.7 pg (ref 26.0–34.0)
MCHC: 31.5 g/dL (ref 30.0–36.0)
MCV: 91.1 fL (ref 78.0–100.0)
Platelets: 373 10*3/uL (ref 150–400)
RBC: 3.14 MIL/uL — ABNORMAL LOW (ref 4.22–5.81)
RDW: 15.6 % — AB (ref 11.5–15.5)
WBC: 10.8 10*3/uL — ABNORMAL HIGH (ref 4.0–10.5)

## 2015-11-28 LAB — RENAL FUNCTION PANEL
ALBUMIN: 1.8 g/dL — AB (ref 3.5–5.0)
ANION GAP: 9 (ref 5–15)
BUN: 70 mg/dL — AB (ref 6–20)
CHLORIDE: 107 mmol/L (ref 101–111)
CO2: 20 mmol/L — AB (ref 22–32)
Calcium: 8.3 mg/dL — ABNORMAL LOW (ref 8.9–10.3)
Creatinine, Ser: 3.6 mg/dL — ABNORMAL HIGH (ref 0.61–1.24)
GFR calc Af Amer: 19 mL/min — ABNORMAL LOW (ref >60)
GFR calc non Af Amer: 16 mL/min — ABNORMAL LOW (ref >60)
GLUCOSE: 151 mg/dL — AB (ref 65–99)
PHOSPHORUS: 4.9 mg/dL — AB (ref 2.5–4.6)
POTASSIUM: 3.6 mmol/L (ref 3.5–5.1)
Sodium: 136 mmol/L (ref 135–145)

## 2015-11-28 NOTE — Progress Notes (Signed)
Occupational Therapy Session Note  Patient Details  Name: Dillon Thomas MRN: [id number] Date of Birth: October 20, 1950  Today's Date: 11/28/2015 OT Individual Time: [id number] OT Individual Time Calculation (min): 63 min    Short Term Goals: Week 1:  OT Short Term Goal 1 (Week 1): STGs=LTGs based on short estimated LOS  Skilled Therapeutic Interventions/Progress Updates:   Skilled OT session completed with focus on self care mgt retraining and use of AE. Pt was agreeable to shower this morning. IV covered. Pt ambulated with RW and steady assist to padded tub bench. Min A overall for bathing with pt using LH sponge for LEs and back. Toileting completed with Max A secondary to pt refusing to complete hygiene. Dressing completed w/c level at sink with pt able to thread LEs into LB garments by crossing ankle over knee.Pt refused to use reacher today. "If I can do it myself, I'd rather not use that." Max A for socks still. Due to time sock aide was not utilized but pt expressed interest in using. Pt was transferred to bed at end of tx and left with all needs within reach.   Therapy Documentation Precautions:  Precautions Precautions: Fall Restrictions Weight Bearing Restrictions: No General:   Vital Signs: Therapy Vitals Temp: 97.6 F (36.4 C) Temp Source: Oral Pulse Rate: 68 Resp: 18 BP: (!) 150/68 Patient Position (if appropriate): Sitting Oxygen Therapy SpO2: 100 % O2 Device: Not Delivered Pain: No c/o pain during session    ADL:      See Function Navigator for Current Functional Status.   Therapy/Group: Individual Therapy  [name] 11/28/2015, 4:26 PM

## 2015-11-28 NOTE — Progress Notes (Signed)
Physical Therapy Session Note  Patient Details  Name: Dillon Thomas MRN: [id number] Date of Birth: Apr 29, 1950  Today's Date: 11/28/2015 PT Individual [id number]   PT Individual Time Calculation: 49 min    Short Term Goals: Week 1:  PT Short Term Goal 1 (Week 1): =LTG due to estimated LOS  Skilled Therapeutic Interventions/Progress Updates:     Patient received sitting in [facility] and agreeable to PT. Patient agreeable to PT, but reports significant fatigue.   PT instructed patient in gait training x 40f with CGA and RW. Patient reports inability to continue after [id number] to pain in L hip.  PT instructed patient in seated therex:  HS curl with level 2 tand x 10 BLE.,  LAQ with level 2 tband x 10 BLE,  AROM hip abduction x 10 BLE,  AROM seated marches x 10 BLE Ankle PF level 2 tband x 12 BLE Isometric hip adduciton x 12 BLE.  Min cues from PT for proper technique including increased control of eccentric motion and to remain in pin free range.   Pt transported patient back to room. [facility] in ur[facility], min assist from PT. Stand pivot to EOB with min assist with RW for safety. PT assisted patient back to bed with mod assist to control BLE secondary to pain.  Call bell left in reach with all all needs met at end of treatment.   Therapy Documentation Precautions:  Precautions Precautions: Fall Restrictions Weight Bearing Restrictions: No   Pain: Pain Assessment Pain Score: 2    See Function Navigator for Current Functional Status.   Therapy/Group: Individual Therapy  AuLorie Phenix1/11/2015, 12:58 PM

## 2015-11-28 NOTE — Progress Notes (Signed)
[facility] Progress Note     Subjective:   L chest wall pain and buttocks pain somewhat improved. Pt feels weak and that he is making poor progress with rehab but says he will continue to try his best. Had bleeding of L arm overnight.    Objective:   BP (!) 155/57 (BP Location: Left Arm)   Pulse 82   Temp 97.6 F (36.4 C) (Oral)   Resp 17   Ht 5\' 11"  (1.803 m)   Wt 216 lb 6.4 oz (98.2 kg)   SpO2 100%   BMI 30.18 kg/m   Intake/Output Summary (Last 24 hours) at 11/28/15 1030 Last data filed at 11/28/15 0900  Gross per 24 hour  Intake              360 ml  Output              950 ml  Net             -590 ml   Net negative 4.2 since admission.   Physical Exam: General: Overweight male, lying in bed Cardiac: RRR, S1, S2, no m/r/g Lungs: Lungs CTAB, no increased WOB MSK: 1+ pitting edema over shins and trace to thighs; mild TTP over L CVA and midline over thoracic spine  Neuro: AOx3, moves all extremities spontaneously Skin: Oozing blood contained within bandage of LUE.   Imaging: US Renal  Result Date: 11/19/2015 CLINICAL DATA:  Acute renal insufficiency EXAM: RENAL / URINARY TRACT ULTRASOUND COMPLETE COMPARISON:  None. FINDINGS: Right Kidney: Length: 12.2 cm. Echogenicity is increased. Renal cortical thickness is normal. No perinephric fluid or hydronephrosis visualized. There is a cystic mass arising from the upper pole of the right kidney measuring 2.5 x 2.8 x 2.2 cm. Within this cystic mass, there is a solid-appearing focus measuring 1.4 x 1.2 x 1.2 cm. There is no sonographically demonstrable calculus or ureterectasis. Left Kidney: Length: 11.6 cm. Echogenicity is increased. Renal cortical thickness is normal. No perinephric fluid or hydronephrosis visualized. There is a cyst arising from the left kidney measuring 3.0 x 2.6 x 2.8 cm. No sonographically demonstrable calculus or ureterectasis. Bladder: Appears normal for degree of bladder distention. IMPRESSION:  Kidneys are echogenic consistent with medical renal disease. No obstructing focus identified on either side. There is a cystic lesion arising from the upper pole of the right kidney which contains a solid focus. This appearance raises concern for potential renal neoplasm with a cystic component. Further evaluation with pre and post contrast MRI or CT should be considered. MRI is preferred in younger patients (due to lack of ionizing radiation) and for evaluating calcified lesion(s). Given the elevated creatinine, MRI may be a better choice for further assessment if there is no contraindication to MR imaging. There is a simple cyst in the left kidney. These results will be called to the ordering clinician or representative by the Radiologist Assistant, and communication documented in the PACS or zVision Dashboard. Electronically Signed   By: Lowella Grip III M.D.   On: 11/19/2015 14:57       Recent Labs Lab 11/23/15 0601 11/23/15 1948 11/24/15 0912 11/25/15 0744 11/26/15 0802 11/27/15 0702 11/28/15 0813  NA 138 135 136 135 136 138 136  K 3.9 3.7 4.0 3.9 3.6 3.7 3.6  CL 109 108 108 106 106 108 107  CO2 19* 19* 21* 18* 20* 20* 20*  GLUCOSE 118* 258* 219* 236* 122* 108* 151*  BUN 56* 61* 57* 57* 63* 68* 70*  CREATININE 3.63* 3.60* 3.55* 3.54* 3.54* 3.61* 3.60*  CALCIUM 8.2* 7.9* 8.4* 8.4* 8.4* 8.4* 8.3*  PHOS  --   --  4.4 4.4 4.6 4.7* 4.9*     Recent Labs Lab 11/23/15 1948 11/25/15 0739 11/26/15 0802 11/27/15 0702 11/28/15 0813  WBC 10.4 11.2* 11.6* 11.6* 10.8*  NEUTROABS 5.1  --   --   --   --   HGB 7.3* 9.0* 9.5* 8.6* 9.0*  HCT 22.4* 28.4* 29.5* 27.2* 28.6*  MCV 88.5 89.0 89.9 90.1 91.1  PLT 405* 456* 399 371 373    Results for Dillon Thomas, Dillon Thomas (MRN [id number]) as of 11/26/2015 10:38  Ref. Range 11/20/2015 11:07  Total Protein ELP Latest Ref Range: 6.0 - 8.5 g/dL 5.8 (L)  Albumin ELP Latest Ref Range: 2.9 - 4.4 g/dL 1.9 (L)  Globulin, Total Latest Ref Range: 2.2 - 3.9 g/dL  3.9  A/G Ratio Latest Ref Range: 0.7 - 1.7  0.5 (L)  Alpha-1-Globulin Latest Ref Range: 0.0 - 0.4 g/dL 0.4  Alpha-2-Globulin Latest Ref Range: 0.4 - 1.0 g/dL 0.8  Beta Globulin Latest Ref Range: 0.7 - 1.3 g/dL 0.7  Gamma Globulin Latest Ref Range: 0.4 - 1.8 g/dL 2.0 (H)  M-SPIKE, % Latest Ref Range: Not Observed g/dL 1.4 (H)  SPE Interp. Unknown Comment  Comment Unknown Comment  Kappa free light chain Latest Ref Range: 3.3 - 19.4 mg/L 1,061.4 (H)  Lamda free light chains Latest Ref Range: 5.7 - 26.3 mg/L 106.3 (H)  Kappa, lamda light chain ratio Latest Ref Range: 0.26 - 1.65  9.98 (H)   Medications:    . amLODipine  10 mg Oral Daily  . aspirin EC  81 mg Oral Daily  . carvedilol  25 mg Oral BID WC  . darbepoetin (ARANESP) injection - NON-DIALYSIS  100 mcg Subcutaneous Q Fri-1800  . donepezil  10 mg Oral QHS  . feeding supplement (NEPRO CARB STEADY)  237 mL Oral BID BM  . feeding supplement (PRO-STAT SUGAR FREE 64)  30 mL Oral BID  . ferrous sulfate  325 mg Oral BID WC  . folic acid  1 mg Oral Daily  . insulin aspart  0-15 Units Subcutaneous TID WC  . insulin aspart  0-5 Units Subcutaneous QHS  . insulin aspart  4 Units Subcutaneous TID WC  . insulin glargine  10 Units Subcutaneous Daily  . levothyroxine  25 mcg Oral QAC breakfast  . montelukast  10 mg Oral QHS  . pantoprazole  40 mg Oral Daily  . rosuvastatin  20 mg Oral q1800  . sodium bicarbonate  1,300 mg Oral BID  . thiamine  100 mg Oral Daily  . ticagrelor  90 mg Oral BID  . torsemide  20 mg Oral BID  . vitamin C  250 mg Oral BID WC   Background: Dillon [facility] is an 65 y.o. male with PMH of uncontrolled type 2 diabetes mellitus with peripheral neuropathy, CKD-3 baseline creatinine 2.00 (last noted in Feb of 2017-1.9), HTN, BPH, hypothyroidism, depression and TIA. He presented to the hospital after being found down and with AMS; in [place] with CBG > 800 on admission. Brain imaging normal. Elevated troponin, thought to be  NSTEMI per Cards. Creatinine 3.48 on admission.  Extensive w/u - findings most consistent with diabetic nephropathy except for elevated light chains, which Oncology believes is still suggestive of worsening kidney disease.   Assessment/ Plan:    1. Acute on Chronic Renal Failure: SCr has been stable over last  several days. Suspect this is A on CRF in the setting of volume depletion on ACE with baseline diabetic nephropathy. However, given possibly nephrotic range protein and hematuria obtained more labs to rule out GN. Last SCr 1.90 in February, so elevated SCr could also represent chronic worsening in the setting of diabetic nephropathy with poorly controlled illnesses. Initial UA with 6-30 RBC per HPF; repeat 11/2 with TNC RBCs and granular casts. CK WNL. Spot urine protein 760 mg/dL. FENa 1.1%, consistent with intrinsic disease. Albumin 1.5 (was 1.9 on admission), which may represent poor nutritional status vs nephrotic syndrome /intrinsic kidney injury. Serologies: ANA negative. Kappa/lambda light chains elevated, with high ratio (close to 10). ANCA titers negative  C3, C4,  HIV, hepatitis panel negative. Single M spike on protein electrophoresis. Adequate UOP at 1.2L with 1 uncharted. Continue torsemide BID. Sodium bicarb increased to 1300 mg BID 11/9. Will need to establish with a Nephrologist outpatient. Believe he may be at his new baseline. Nephrology will continue to follow.   2. Elevated kappa/lambda light chains, with high ratio (close to 10): Obtained last week due to past progression of renal injury. Bone Survey 11/9 without lytic or blastic lesions. 24-hour urine light chains collected 11/10. Consulted with oncologist Dr. Alvy Bimler 11/10 who did not recommend further inpatient workup; she is to see patient outpatient with appointment 12/15/15.   2. HTN: SBPs from 126-165. Continue carvedilol 25 mg BID, norvasc 10 mg, and torsemide 20 mg BID. Obtain orthostatic vital signs to gauge how aggressive  to be with antihypertensive increases.   3. Anemia: Trended down. Improved to 9.0 s/p transfusion. 9.5 > 8.6. Hgb as high as 12.5 on 02/11/15 via [place]. Iron studies showed severe deficiency. s/p feraheme 11/2, with repeat dose 11/6. To start aranesp 100 mcg q Friday. Received 1 u pRBCs 11/5 for hgb to 7.3 and again 11/7 was transfused 1 u pRBCs, heparin discontinued 11/7. SCDs ordered. FOBT negative 11/8. Urine microscopic with 6-30 RBCs 11/9. Had bleeding from wound of LUE overnight. Hgb stable, > 8 today.  4. Left-sided Chest Wall Pain: Acute fractures of left fourth and fifth ribs on bone survey.   5. Renal cysts: Nonobstructive. MRI showed complex renal cortical mass with mural nodule, concerning for renal cell carcinoma; will need further imaging with IV contrast. Would benefit from Urology consultation.   6. Elevated troponin: Concern for NSTEMI. Cardiology following. Agree with abstaining from contrast for cath for the time being.   7. Diabetes: Per primary team. Hgb A1c 10.0, worse from hgb A1c 8.1 07/16/15. Home regimen of tradjenta and novolog 70/30 80 U with breakfast, 72 U with dinner. Improved CBGs on 10 U lantus BID, meal coverage and SSI.  8. L pubic ramus fractures: Continue pain management and mobilization with PT. Ortho saw, said to be stable fractures, and recommended outpt followup.  Olene Floss, MD [place], PGY-2 11/28/2015, 10:30 AM   I have seen and examined this patient and agree with plan and assessment in the above note with renal recommendations/intervention highlighted. Renal wise is stable, BP overall some better, significance of elevated kappa light chains to be determined on outpt basis per Dr. Alvy Bimler, Hb 9 despite some arm bleeding. I still do not see any orthostatic BP's (about all will be able to get is lying vs sitting)  Lynne Righi B,MD 11/28/2015 11:04 AM

## 2015-11-28 NOTE — Progress Notes (Signed)
[facility]     PROGRESS NOTE  Subjective/Complaints:  Appreciate orthopedic note  ROS:  Denies SOB, N/V/D.  Objective: Vital Signs: Blood pressure (!) 155/57, pulse 82, temperature 97.6 F (36.4 C), temperature source Oral, resp. rate 17, height 5' 11" (1.803 m), weight 98.2 kg (216 lb 6.4 oz), SpO2 100 %. Dg Bone Survey Met  Addendum Date: 11/26/2015   ADDENDUM REPORT: 11/26/2015 18:37 ADDENDUM: Bilateral twelfth rib fractures noted. Electronically Signed   By: Nolon Nations M.D.   On: 11/26/2015 18:37   Result Date: 11/26/2015 CLINICAL DATA:  Elevated serum immunoglobulin free light chains. Pt could not stand or roll onto his side. Best obtainable images due to pt condition. EXAM: METASTATIC BONE SURVEY COMPARISON:  Chest x-ray 11/24/2015, abdominal film 09/16/2015 FINDINGS: Images of the axial and appendicular skeleton are performed. There are acute fractures of the left fourth and fifth ribs. Heart size is normal. There is dense opacity in the left lower lobe and left pleural effusion. Evaluation of the spine is limited by limited patient mobility. Degenerative changes are identified in the mid cervical spine. There is no acute fracture identified. Degenerative changes are identified throughout the thoracic and lumbar spine. There is vertebral body height loss at L3, likely chronic based on its appearance. Deformity of the left superior pubic ramus and possibly left inferior pubic ramus consistent with fracture. No suspicious lytic or blastic lesions are identified. There is dense atherosclerotic calcification of the imaged vessels. Incidental note of subcutaneous edema. Surgical clips are noted in the right upper quadrant of the abdomen. IMPRESSION: 1. No suspicious lytic or blastic lesions. 2. Acute fractures of the left fourth and fifth ribs. 3. Acute fractures of the left superior and possibly inferior pubic ramus. 4. Atherosclerotic disease.   Subcutaneous edema. Electronically Signed: By: Nolon Nations M.D. On: 11/26/2015 15:29    Recent Labs  11/27/15 0702 11/28/15 0813  WBC 11.6* 10.8*  HGB 8.6* 9.0*  HCT 27.2* 28.6*  PLT 371 373    Recent Labs  11/27/15 0702 11/28/15 0813  NA 138 136  K 3.7 3.6  CL 108 107  GLUCOSE 108* 151*  BUN 68* 70*  CREATININE 3.61* 3.60*  CALCIUM 8.4* 8.3*   CBG (last 3)   Recent Labs  11/26/15 2111 11/27/15 0642 11/27/15 1119  GLUCAP 204* 109* 189*    Wt Readings from Last 3 Encounters:  11/24/15 98.2 kg (216 lb 6.4 oz)  11/18/15 101.4 kg (223 lb 8.7 oz)    Physical Exam:  BP (!) 155/57 (BP Location: Left Arm)   Pulse 82   Temp 97.6 F (36.4 C) (Oral)   Resp 17   Ht 5' 11" (1.803 m)   Wt 98.2 kg (216 lb 6.4 oz)   SpO2 100%   BMI 30.18 kg/m  Constitutional: He appears well-developed. NAD. Obese. HENT: Normocephalicand atraumatic.  Eyes: EOMare normal. No discharge. Cardiovascular: RRR. No JVD. Respiratory: Effort normaland breath sounds normal. No respiratory distress.  GI: Soft. Bowel sounds are normal. He exhibits no distension.  Musculoskeletal: He exhibits edema. He exhibits no tenderness.  Neurological: He is alert.  Delay in processing. Motor: LUE: 4+/5 proximal to distal (stable) RUE: 4+/5 proximal to distal RLE: hip flexion 4+/5, knee extension 4+/5, ankle dorsi/plantar flexion 5/5 LLE; Hip flexion 4/5, knee extension 4+/5, ankle dorsi/plantar flexion 5/5(unchanged) Skin: Reported sacral ulcer. Psychiatric: Mood, affect, and behavior, relatively normal.  Assessment/Plan: 1. Functional deficits secondary to debilitation which require 3+ hours per  day of interdisciplinary therapy in a comprehensive inpatient rehab setting. Physiatrist is providing close team supervision and 24 hour management of active medical problems listed below. Physiatrist and rehab team continue to assess barriers to discharge/monitor patient progress toward functional and  medical goals.  Function:  Bathing Bathing position   Position: Shower  Bathing parts Body parts bathed by patient: Right arm, Left arm, Chest, Abdomen, Front perineal area, Right upper leg, Left upper leg Body parts bathed by helper: Buttocks, Right lower leg, Left lower leg, Back  Bathing assist Assist Level:  (Mod A)      Upper Body Dressing/Undressing Upper body dressing   What is the patient wearing?: Pull over shirt/dress     Pull over shirt/dress - Perfomed by patient: Thread/unthread right sleeve, Thread/unthread left sleeve, Put head through opening Pull over shirt/dress - Perfomed by helper: Put head through opening        Upper body assist Assist Level: Set up      Lower Body Dressing/Undressing Lower body dressing   What is the patient wearing?: Underwear, Ted Hose, Non-skid slipper socks Underwear - Performed by patient: Thread/unthread right underwear leg, Thread/unthread left underwear leg, Pull underwear up/down   Pants- Performed by patient: Pull pants up/down Pants- Performed by helper: Thread/unthread right pants leg, Thread/unthread left pants leg   Non-skid slipper socks- Performed by helper: Don/doff right sock, Don/doff left sock               TED Hose - Performed by helper: Don/doff right TED hose, Don/doff left TED hose  Lower body assist Assist for lower body dressing: Touching or steadying assistance (Pt > 75%)      Toileting Toileting Toileting activity did not occur: No continent bowel/bladder event   Toileting steps completed by helper: Performs perineal hygiene, Adjust clothing prior to toileting, Adjust clothing after toileting Toileting Assistive Devices: Grab bar or rail  Toileting assist Assist level: Supervision or verbal cues   Transfers Chair/bed transfer   Chair/bed transfer method: Stand pivot, Ambulatory Chair/bed transfer assist level: Touching or steadying assistance (Pt > 75%) Chair/bed transfer assistive device:  Walker, Air cabin crew     Max distance: 25 Assist level: Touching or steadying assistance (Pt > 75%)   Wheelchair   Type: Manual Max wheelchair distance: 150 Assist Level: Supervision or verbal cues  Cognition Comprehension Comprehension assist level: Follows complex conversation/direction with no assist  Expression Expression assist level: Expresses basic needs/ideas: With no assist  Social Interaction Social Interaction assist level: Interacts appropriately 75 - 89% of the time - Needs redirection for appropriate language or to initiate interaction.  Problem Solving Problem solving assist level: Solves basic problems with no assist  Memory Memory assist level: More than reasonable amount of time     Medical Problem List and Plan: 1.  Debilitation/acute encephalopathy secondary to DKA/NSTEMI--plan cardiac catheterization 2-4 weeks per Dr. Einar Gip  Cont CIR, PT, OT, speech  Appreciate Cards recs 2.  DVT Prophylaxis/Anticoagulation: Subcutaneous heparin. Monitor platelet counts and any signs of bleeding 3. Pain Management: Hydrocodone as needed 4. Mood: Aricept 10 mg daily at bedtime 5. Neuropsych: This patient is capable of making decisions on his own behalf. 6. Skin/Wound Care: Sacral ulcer per report, pt noncompliant with pressure relief. 7. Fluids/Electrolytes/Nutrition: Routine I&Os 8. CKD-3 with baseline creatinine 2.00. Renal service continued to follow with workup ongoing. Demadex initiated 11/20/1998 1740 mg twice a day.   Cr. 3.61 on 11/10, ?new baseline  Appreciate nephro  recs 9. Uncontrolled hypertension.   Coreg 12.5 mg twice a day, increased to 25 on 11/8  Being managed by Neprho, appreciate recs 10. Diabetes mellitus with peripheral neuropathy. Hemoglobin A1c of 10. Home regimen of trandjenta and NovoLog 70/30 80 units at breakfast, 72 units with dinner  Lantus insulin 10 units twice a day, changed to daily on 11/10.   Novolog 4U TID, increased  to 8U TID on 11/10  Check blood sugars before meals and at bedtime.   Will cont to monitor 11. Hyperlipidemia. Crestor 12. Hypothyroidism.TSH 2.954. Continue synthroid 13. Acute on chronic anemia.   Continue Aranesp, FeSO4/Vit C added 11/8  Hb 8.6 on 11/10  Will cont to monitor 14. Cervical spondylosis: Monitor 15. Leukocytosis:   WBCs 11.6 on 11/10 (stable)  Cont to monitor  UA with rare bacteria, negative WBCs, Ucx pending, multi-species, no need to re-collect  . No shortness of breath. No breathing issues, no chest x-ray needed at the current time 16. Left chest discomfort  Improving  Angina +/- pleural effusion  ECG reviewed, troponin reviewed, improved 17. Sleep disturbance  Trazodone 50 PRN, increased to 100 on 11/8  Improving 18. Hypoalbuminemia   Supplement started 11/8 19. Pleural effusion  Diuresis increased by Nephro  Protein supplementation 20. Left rib fractures  On bone scan, results reviewed, conservative care 21. Left pubic rami fractures  Ortho  recommends follow-up 2 weeks in the office. No weightbearing limitations 22. Elevated light chains  Recs per Nephro  LOS (Days) 5 A FACE TO FACE EVALUATION WAS PERFORMED  Alysia Penna E 11/28/2015 11:03 AM

## 2015-11-28 NOTE — Progress Notes (Signed)
Pt noted having moderate amount of dried blood to LUE from elbow down to wrist as well as dried blood smears to bed railing and bed. Removed soiled foam dsg that was already in place where the bleeding likely came from. No active bleeding noted at the site, pinpoint in size, cleansed with NS wipes and large tegaderm place over site. Will cont to monitor skin. Leniya Breit, Dione Plover

## 2015-11-28 NOTE — Progress Notes (Signed)
Patient A/O, no noted distress. C/O minimal pain, administered prn pain regimen, effective. Patient participated in therapy. BLE bruising, administered dsg change to LUE. Educated patient on medications, tolerated well. LBM 11/28/15. Staff will continue to monitor and meet needs.

## 2015-11-28 NOTE — Progress Notes (Addendum)
Physical Therapy Note  Patient Details  Name: Dillon Thomas MRN: [id number] Date of Birth: May 15, 1950 Today's Date: 11/28/2015  1300-1355, 55 min individual tx Pain: 2/10 L pelvis, premedicated  Pt reported he was wet with urine due to accident with urinal.  Bed mobility modified independent using bed features for sitting up on R side of bed.  Sit> stand with supervision and cues for safety using RW for standing to do hygiene;  PT donned gown.  W/c propulsion x 75' with supervision, limited by fatigue.  Activity tolerance work on [employer] in sitting x 20 cycles x 2 at level 60 cm/sec x 5minutes.  Gait x 60' on level tile with min guard assist; limited by fatigue.  Pt needed max cueing for = step lengths, keeping feet within RW, forward gaze and upright trunk.  Pt left resting in w/c with all needs within reach. Tonyia Marschall 11/28/2015, 1:36 PM

## 2015-11-29 ENCOUNTER — Inpatient Hospital Stay (HOSPITAL_COMMUNITY): Payer: MEDICARE | Admitting: Physical Therapy

## 2015-11-29 LAB — RENAL FUNCTION PANEL
ALBUMIN: 1.7 g/dL — AB (ref 3.5–5.0)
ANION GAP: 8 (ref 5–15)
BUN: 75 mg/dL — ABNORMAL HIGH (ref 6–20)
CHLORIDE: 107 mmol/L (ref 101–111)
CO2: 23 mmol/L (ref 22–32)
Calcium: 8.4 mg/dL — ABNORMAL LOW (ref 8.9–10.3)
Creatinine, Ser: 3.61 mg/dL — ABNORMAL HIGH (ref 0.61–1.24)
GFR calc Af Amer: 19 mL/min — ABNORMAL LOW (ref >60)
GFR, EST NON AFRICAN AMERICAN: 16 mL/min — AB (ref >60)
Glucose, Bld: 172 mg/dL — ABNORMAL HIGH (ref 65–99)
PHOSPHORUS: 4.5 mg/dL (ref 2.5–4.6)
POTASSIUM: 3.5 mmol/L (ref 3.5–5.1)
Sodium: 138 mmol/L (ref 135–145)

## 2015-11-29 LAB — CBC
HEMATOCRIT: 28.2 % — AB (ref 39.0–52.0)
HEMOGLOBIN: 8.9 g/dL — AB (ref 13.0–17.0)
MCH: 28.6 pg (ref 26.0–34.0)
MCHC: 31.6 g/dL (ref 30.0–36.0)
MCV: 90.7 fL (ref 78.0–100.0)
Platelets: 367 10*3/uL (ref 150–400)
RBC: 3.11 MIL/uL — AB (ref 4.22–5.81)
RDW: 15.9 % — ABNORMAL HIGH (ref 11.5–15.5)
WBC: 11.2 10*3/uL — AB (ref 4.0–10.5)

## 2015-11-29 LAB — GLUCOSE, CAPILLARY
GLUCOSE-CAPILLARY: 95 mg/dL (ref 65–99)
Glucose-Capillary: 130 mg/dL — ABNORMAL HIGH (ref 65–99)

## 2015-11-29 MED ORDER — HYDRALAZINE HCL 25 MG PO TABS
25.0000 mg | ORAL_TABLET | Freq: Three times a day (TID) | ORAL | Status: DC
  Administered 2015-11-29 – 2015-12-01 (×6): 25 mg via ORAL
  Filled 2015-11-29 (×7): qty 1

## 2015-11-29 MED ORDER — DONEPEZIL HCL 10 MG PO TABS
10.0000 mg | ORAL_TABLET | Freq: Every day | ORAL | Status: DC
  Administered 2015-11-29 – 2015-12-03 (×5): 10 mg via ORAL
  Filled 2015-11-29 (×5): qty 1

## 2015-11-29 MED ORDER — TRAZODONE HCL 50 MG PO TABS
50.0000 mg | ORAL_TABLET | Freq: Every evening | ORAL | Status: DC | PRN
Start: 2015-11-29 — End: 2015-12-04
  Administered 2015-11-30: 50 mg via ORAL
  Filled 2015-11-29: qty 1

## 2015-11-29 MED ORDER — TEMAZEPAM 7.5 MG PO CAPS
7.5000 mg | ORAL_CAPSULE | Freq: Every day | ORAL | Status: DC
  Administered 2015-11-29: 7.5 mg via ORAL
  Filled 2015-11-29: qty 1

## 2015-11-29 MED ORDER — MONTELUKAST SODIUM 10 MG PO TABS
10.0000 mg | ORAL_TABLET | Freq: Every day | ORAL | Status: DC
  Administered 2015-11-29 – 2015-12-03 (×5): 10 mg via ORAL
  Filled 2015-11-29 (×5): qty 1

## 2015-11-29 NOTE — Progress Notes (Signed)
Physical Therapy Session Note  Patient Details  Name: Dillon Thomas MRN: [id number] Date of Birth: 01/06/1951  Today's Date: 11/29/2015 PT Individual Time: 1520-1547 PT Individual Time Calculation (min): 27 min    Short Term Goals: Week 1:  PT Short Term Goal 1 (Week 1): =LTG due to estimated LOS   Skilled Therapeutic Interventions/Progress Updates:    Pt received asleep in bed but easily awakened with verbal & tactile cuing. Pt agreeable to tx, noting 2/10 back & L side pain, but declining OOB activity. Pt used urinal from bed level without assistance from therapist and continent void but spillage on gown; therapist assisted pt into clean gown. Instructed pt in the following BLE therapeutic strengthening exercises: ankle pumps & hip abduction (RLE only as pt with c/o pain when attempting with LLE). Pt required rest breaks 2/2 muscle fatigue during exercises; pt reports not being very active PTA. At end of session pt left in bed with all needs within reach.  Vitals at rest: HR = 71 bpm, SpO2 = 99% Vitals with activity: HR = 75 bpm, SpO2 = 99%  Therapy Documentation Precautions:  Precautions Precautions: Fall Restrictions Weight Bearing Restrictions: No   See Function Navigator for Current Functional Status.   Therapy/Group: Individual Therapy  Waunita Schooner 11/29/2015, 5:28 PM

## 2015-11-29 NOTE — Progress Notes (Signed)
Physical Therapy Session Note  Patient Details  Name: Dillon Thomas MRN: [id number] Date of Birth: 12-11-1950  Today's Date: 11/29/2015 PT Individual Time: 1300-1400 PT Individual Time Calculation (min): 60 min    Short Term Goals: Week 1:  PT Short Term Goal 1 (Week 1): =LTG due to estimated LOS  Skilled Therapeutic Interventions/Progress Updates:    Pt was seen bedside in the pm. Pt transferred supine to edge of bed with side rail and min A with verbal cues. Pt performed multiple sit to stand and stand pivot transfers with c/s to min guard and verbal cues. Pt ambulated 80 and 93 feet with rolling walker and c/s to min guard with verbal cues. Pt requires multiple rest breaks secondary to pain. Pt returned to room following treatment. Pt transferred w/c to edge of bed with rolling walker and c/s to min guard. Pt transferred edge of bed to supine with mod A and verbal cues with head of bed elevated and siderail. Pt left sitting up in bed with call bell within reach.   Therapy Documentation Precautions:  Precautions Precautions: Fall Restrictions Weight Bearing Restrictions: No General:   Pain: Pt c/o mod back pain.   See Function Navigator for Current Functional Status.   Therapy/Group: Individual Therapy  Dub Amis 11/29/2015, 2:03 PM

## 2015-11-29 NOTE — Progress Notes (Signed)
[place] PHYSICAL MEDICINE & REHABILITATION     PROGRESS NOTE  Subjective/Complaints:  Patient complains of poor sleep, sometimes is woken up for his at bedtime meds according to patient  ROS:  Denies SOB, N/V/D.  Objective: Vital Signs: Blood pressure (!) 162/67, pulse 74, temperature 98.3 F (36.8 C), temperature source Oral, resp. rate 18, height 5\' 11"  (1.803 m), weight 98.2 kg (216 lb 6.4 oz), SpO2 98 %. No results found.  Recent Labs  11/28/15 0813 11/29/15 0633  WBC 10.8* 11.2*  HGB 9.0* 8.9*  HCT 28.6* 28.2*  PLT 373 367    Recent Labs  11/28/15 0813 11/29/15 0633  NA 136 138  K 3.6 3.5  CL 107 107  GLUCOSE 151* 172*  BUN 70* 75*  CREATININE 3.60* 3.61*  CALCIUM 8.3* 8.4*   CBG (last 3)   Recent Labs  11/26/15 2111 11/27/15 0642 11/27/15 1119  GLUCAP 204* 109* 189*    Wt Readings from Last 3 Encounters:  11/24/15 98.2 kg (216 lb 6.4 oz)  11/18/15 101.4 kg (223 lb 8.7 oz)    Physical Exam:  BP (!) 162/67   Pulse 74   Temp 98.3 F (36.8 C) (Oral)   Resp 18   Ht 5\' 11"  (1.803 m)   Wt 98.2 kg (216 lb 6.4 oz)   SpO2 98%   BMI 30.18 kg/m  Constitutional: He appears well-developed. NAD. Obese. HENT: Normocephalicand atraumatic.  Eyes: EOMare normal. No discharge. Cardiovascular: RRR. No JVD. Respiratory: Effort normaland breath sounds normal. No respiratory distress.  GI: Soft. Bowel sounds are normal. He exhibits no distension.  Musculoskeletal: He exhibits edema. He exhibits no tenderness.  Neurological: He is alert.  Delay in processing. Motor: LUE: 4+/5 proximal to distal (stable) RUE: 4+/5 proximal to distal RLE: hip flexion 4+/5, knee extension 4+/5, ankle dorsi/plantar flexion 5/5 LLE; Hip flexion 4/5, knee extension 4+/5, ankle dorsi/plantar flexion 5/5(unchanged) Skin: Reported sacral ulcer. Psychiatric: Mood, affect, and behavior, relatively normal.  Assessment/Plan: 1. Functional deficits secondary to debilitation which  require 3+ hours per day of interdisciplinary therapy in a comprehensive inpatient rehab setting. Physiatrist is providing close team supervision and 24 hour management of active medical problems listed below. Physiatrist and rehab team continue to assess barriers to discharge/monitor patient progress toward functional and medical goals.  Function:  Bathing Bathing position   Position: Shower  Bathing parts Body parts bathed by patient: Right arm, Left arm, Chest, Abdomen, Front perineal area, Right upper leg, Left upper leg, Right lower leg, Left lower leg, Back Body parts bathed by helper: Buttocks  Bathing assist Assist Level: Touching or steadying assistance(Pt > 75%)      Upper Body Dressing/Undressing Upper body dressing   What is the patient wearing?: Pull over shirt/dress     Pull over shirt/dress - Perfomed by patient: Thread/unthread right sleeve, Thread/unthread left sleeve, Put head through opening Pull over shirt/dress - Perfomed by helper: Put head through opening        Upper body assist Assist Level: Supervision or verbal cues      Lower Body Dressing/Undressing Lower body dressing   What is the patient wearing?: Underwear, Pants, Non-skid slipper socks, Ted Hose Underwear - Performed by patient: Thread/unthread right underwear leg, Thread/unthread left underwear leg, Pull underwear up/down   Pants- Performed by patient: Thread/unthread right pants leg, Thread/unthread left pants leg, Pull pants up/down Pants- Performed by helper: Thread/unthread right pants leg, Thread/unthread left pants leg   Non-skid slipper socks- Performed by helper: Don/doff  right sock, Don/doff left sock               TED Hose - Performed by helper: Don/doff right TED hose, Don/doff left TED hose  Lower body assist Assist for lower body dressing: Touching or steadying assistance (Pt > 75%)      Toileting Toileting Toileting activity did not occur: No continent bowel/bladder  event   Toileting steps completed by helper: Performs perineal hygiene Toileting Assistive Devices: Grab bar or rail  Toileting assist Assist level: Touching or steadying assistance (Pt.75%)   Transfers Chair/bed transfer   Chair/bed transfer method: Stand pivot, Ambulatory Chair/bed transfer assist level: Touching or steadying assistance (Pt > 75%) Chair/bed transfer assistive device: Medical sales representative     Max distance: 60 Assist level: Touching or steadying assistance (Pt > 75%)   Wheelchair   Type: Manual Max wheelchair distance: 75 Assist Level: Supervision or verbal cues  Cognition Comprehension Comprehension assist level: Follows complex conversation/direction with no assist  Expression Expression assist level: Expresses basic needs/ideas: With no assist  Social Interaction Social Interaction assist level: Interacts appropriately 75 - 89% of the time - Needs redirection for appropriate language or to initiate interaction.  Problem Solving Problem solving assist level: Solves basic problems with no assist  Memory Memory assist level: More than reasonable amount of time     Medical Problem List and Plan: 1.  Debilitation/acute encephalopathy secondary to DKA/NSTEMI--plan cardiac catheterization 2-4 weeks per Dr. Einar Gip  Cont CIR, PT, OT, speech  Appreciate Cards recs 2.  DVT Prophylaxis/Anticoagulation: Subcutaneous heparin. Monitor platelet counts and any signs of bleeding 3. Pain Management: Hydrocodone as needed 4. Mood: Aricept 10 mg daily at bedtime 5. Neuropsych: This patient is capable of making decisions on his own behalf. 6. Skin/Wound Care: Sacral ulcer per report, pt noncompliant with pressure relief. 7. Fluids/Electrolytes/Nutrition: Routine I&Os 8. CKD-3 with baseline creatinine 2.00. Renal service continued to follow with workup ongoing. Demadex initiated 11/20/1998 1740 mg twice a day.   Cr. 3.61 on 11/10, ?new baseline  Appreciate nephro  recs 9. Uncontrolled hypertension.   Coreg 12.5 mg twice a day, increased to 25 on 11/8  Being managed by Neprho, appreciate recs 10. Diabetes mellitus with peripheral neuropathy. Hemoglobin A1c of 10. Home regimen of trandjenta and NovoLog 70/30 80 units at breakfast, 72 units with dinner  Lantus insulin 10 units twice a day, changed to daily on 11/10.   Novolog 4U TID, increased to 8U TID on 11/10  Check blood sugars before meals and at bedtime.   Will cont to monitor 11. Hyperlipidemia. Crestor 12. Hypothyroidism.TSH 2.954. Continue synthroid 13. Acute on chronic anemia.   Continue Aranesp, FeSO4/Vit C added 11/8  Hb 8.6 on 11/10  Will cont to monitor 14. Cervical spondylosis: Monitor 15. Leukocytosis:   WBCs 11.6 on 11/10 (stable)  Cont to monitor  UA with rare bacteria, negative WBCs, Ucx pending, multi-species, no need to re-collect  . No shortness of breath. No breathing issues, no chest x-ray needed at the current time 16. Left chest discomfort  Improving  Angina +/- pleural effusion  ECG reviewed, troponin reviewed, improved 17. Sleep disturbance  Trazodone 50 PRN, increased to 100 on 11/8, up to 150 mg, but no improvement  Reduced to 50 mg daily at bedtime when necessary, start Restoril 7.5 milligrams at 18. Hypoalbuminemia   Supplement started 11/8 19. Pleural effusion  Diuresis increased by Nephro  Protein supplementation 20. Left rib fractures  On bone  scan, results reviewed, conservative care 21. Left pubic rami fractures  Ortho  recommends follow-up 2 weeks in the office. No weightbearing limitations 22. Elevated light chains  Recs per Nephro  LOS (Days) 6 A FACE TO FACE EVALUATION WAS PERFORMED  Alysia Penna E 11/29/2015 11:02 AM

## 2015-11-29 NOTE — Progress Notes (Signed)
Physical Therapy Session Note  Patient Details  Name: Dillon Thomas MRN: [id number] Date of Birth: 19-Jan-1950  Today's Date: 11/29/2015 PT Individual Time: 0905-1001 PT Individual Time Calculation (min): 56 min    Short Term Goals: Week 1:  PT Short Term Goal 1 (Week 1): =LTG due to estimated LOS  Skilled Therapeutic Interventions/Progress Updates:    Pt received in bed, noting 2/10 pain in Back, buttocks, & L side at rest but premedicated & agreeable to tx. Therapist donned BLE ted hose and socks total assist for time management; pt noted B foot pain & RN made aware. Pt transferred supine>sitting EOB With bed rails, HOB elevated & extra time but with supervision overall; encouraged pt to attempt supine>sitting EOB with bed flat in perperation for d/c but pt reports he will get a hospital bed if he needs to. Also discussed importance of not holding breath during physically demanding tasks. Pt requested to use urinal & transferred sit>stand with min assist to do so, requiring min assist for standing balance and continent void. Pt transferred bed>w/c via stand pivot with RW & min guard assist. Pt propelled w/c x 150 ft with multiple rest breaks, BUE & supervision room>gym for endurance training. In gym pt ambulated 40 ft with RW & steady assist with several standing rest break; activity focused on endurance training. Pt transported back to room & left in w/c with all needs within reach.  Throughout session pt required significantly extra time to complete all functional tasks, as well as significantly long rest breaks 2/2 fatigue. Pt's SpO2 & HR remained WNL throughout all tasks (SpO2 = 90-96%, HR = ~69-76 bpm)  Therapy Documentation Precautions:  Precautions Precautions: Fall Restrictions Weight Bearing Restrictions: No   See Function Navigator for Current Functional Status.   Therapy/Group: Individual Therapy  Waunita Schooner 11/29/2015, 12:37 PM

## 2015-11-29 NOTE — Progress Notes (Signed)
[name] Progress Note     Subjective:   Patient doing well this morning. No complaints.    Objective:   BP (!) 162/67   Pulse 74   Temp 98.3 F (36.8 C) (Oral)   Resp 18   Ht 5\' 11"  (1.803 m)   Wt 216 lb 6.4 oz (98.2 kg)   SpO2 98%   BMI 30.18 kg/m   Intake/Output Summary (Last 24 hours) at 11/29/15 0847 Last data filed at 11/29/15 0600  Gross per 24 hour  Intake              360 ml  Output             1325 ml  Net             -965 ml   Net negative 4.2 since admission.   Physical Exam: General: Overweight male, lying in bed Cardiac: RRR, S1, S2, no m/r/g Lungs: Lungs CTAB, no increased WOB MSK: Trace pitting edema over shins, otherwise none noted on thighs  Neuro: AOx3, moves all extremities spontaneously  Imaging: US Renal  Result Date: 11/19/2015 CLINICAL DATA:  Acute renal insufficiency EXAM: RENAL / URINARY TRACT ULTRASOUND COMPLETE COMPARISON:  None. FINDINGS: Right Kidney: Length: 12.2 cm. Echogenicity is increased. Renal cortical thickness is normal. No perinephric fluid or hydronephrosis visualized. There is a cystic mass arising from the upper pole of the right kidney measuring 2.5 x 2.8 x 2.2 cm. Within this cystic mass, there is a solid-appearing focus measuring 1.4 x 1.2 x 1.2 cm. There is no sonographically demonstrable calculus or ureterectasis. Left Kidney: Length: 11.6 cm. Echogenicity is increased. Renal cortical thickness is normal. No perinephric fluid or hydronephrosis visualized. There is a cyst arising from the left kidney measuring 3.0 x 2.6 x 2.8 cm. No sonographically demonstrable calculus or ureterectasis. Bladder: Appears normal for degree of bladder distention. IMPRESSION: Kidneys are echogenic consistent with medical renal disease. No obstructing focus identified on either side. There is a cystic lesion arising from the upper pole of the right kidney which contains a solid focus. This appearance raises concern for potential  renal neoplasm with a cystic component. Further evaluation with pre and post contrast MRI or CT should be considered. MRI is preferred in younger patients (due to lack of ionizing radiation) and for evaluating calcified lesion(s). Given the elevated creatinine, MRI may be a better choice for further assessment if there is no contraindication to MR imaging. There is a simple cyst in the left kidney. These results will be called to the ordering clinician or representative by the Radiologist Assistant, and communication documented in the PACS or zVision Dashboard. Electronically Signed   By: Lowella Grip III M.D.   On: 11/19/2015 14:57       Recent Labs Lab 11/23/15 1948 11/24/15 0912 11/25/15 0744 11/26/15 0802 11/27/15 0702 11/28/15 0813 11/29/15 0633  NA 135 136 135 136 138 136 138  K 3.7 4.0 3.9 3.6 3.7 3.6 3.5  CL 108 108 106 106 108 107 107  CO2 19* 21* 18* 20* 20* 20* 23  GLUCOSE 258* 219* 236* 122* 108* 151* 172*  BUN 61* 57* 57* 63* 68* 70* 75*  CREATININE 3.60* 3.55* 3.54* 3.54* 3.61* 3.60* 3.61*  CALCIUM 7.9* 8.4* 8.4* 8.4* 8.4* 8.3* 8.4*  PHOS  --  4.4 4.4 4.6 4.7* 4.9* 4.5     Recent Labs Lab 11/23/15 1948  11/26/15 0802 11/27/15 0702 11/28/15 0813 11/29/15 0633  WBC 10.4  < >  11.6* 11.6* 10.8* 11.2*  NEUTROABS 5.1  --   --   --   --   --   HGB 7.3*  < > 9.5* 8.6* 9.0* 8.9*  HCT 22.4*  < > 29.5* 27.2* 28.6* 28.2*  MCV 88.5  < > 89.9 90.1 91.1 90.7  PLT 405*  < > 399 371 373 367  < > = values in this interval not displayed.  Results for Dillon, Thomas (MRN [id number]) as of 11/26/2015 10:38  Ref. Range 11/20/2015 11:07  Total Protein ELP Latest Ref Range: 6.0 - 8.5 g/dL 5.8 (L)  Albumin ELP Latest Ref Range: 2.9 - 4.4 g/dL 1.9 (L)  Globulin, Total Latest Ref Range: 2.2 - 3.9 g/dL 3.9  A/G Ratio Latest Ref Range: 0.7 - 1.7  0.5 (L)  Alpha-1-Globulin Latest Ref Range: 0.0 - 0.4 g/dL 0.4  Alpha-2-Globulin Latest Ref Range: 0.4 - 1.0 g/dL 0.8  Beta Globulin  Latest Ref Range: 0.7 - 1.3 g/dL 0.7  Gamma Globulin Latest Ref Range: 0.4 - 1.8 g/dL 2.0 (H)  M-SPIKE, % Latest Ref Range: Not Observed g/dL 1.4 (H)  SPE Interp. Unknown Comment  Comment Unknown Comment  Kappa free light chain Latest Ref Range: 3.3 - 19.4 mg/L 1,061.4 (H)  Lamda free light chains Latest Ref Range: 5.7 - 26.3 mg/L 106.3 (H)  Kappa, lamda light chain ratio Latest Ref Range: 0.26 - 1.65  9.98 (H)   Medications:    . amLODipine  10 mg Oral Daily  . aspirin EC  81 mg Oral Daily  . carvedilol  25 mg Oral BID WC  . darbepoetin (ARANESP) injection - NON-DIALYSIS  100 mcg Subcutaneous Q Fri-1800  . donepezil  10 mg Oral QHS  . feeding supplement (NEPRO CARB STEADY)  237 mL Oral BID BM  . feeding supplement (PRO-STAT SUGAR FREE 64)  30 mL Oral BID  . ferrous sulfate  325 mg Oral BID WC  . folic acid  1 mg Oral Daily  . insulin aspart  0-15 Units Subcutaneous TID WC  . insulin aspart  0-5 Units Subcutaneous QHS  . insulin aspart  4 Units Subcutaneous TID WC  . insulin glargine  10 Units Subcutaneous Daily  . levothyroxine  25 mcg Oral QAC breakfast  . montelukast  10 mg Oral QHS  . pantoprazole  40 mg Oral Daily  . rosuvastatin  20 mg Oral q1800  . sodium bicarbonate  1,300 mg Oral BID  . thiamine  100 mg Oral Daily  . ticagrelor  90 mg Oral BID  . torsemide  20 mg Oral BID  . vitamin C  250 mg Oral BID WC   Background: Dillon Thomas is an 65 y.o. male with PMH of uncontrolled type 2 diabetes mellitus with peripheral neuropathy, CKD-3 baseline creatinine 2.00 (last noted in Feb of 2017-1.9), HTN, BPH, hypothyroidism, depression and TIA. He presented to the hospital after being found down and with AMS; in [place] with CBG > 800 on admission. Brain imaging normal. Elevated troponin, thought to be NSTEMI per Cards. Creatinine 3.48 on admission.  Extensive w/u - findings most consistent with diabetic nephropathy except for elevated light chains, which Oncology believes is still  suggestive of worsening kidney disease.   Assessment/ Plan:    1. Acute on Chronic Renal Failure:Elevated creatinine on admission in the 3's higher than previous baseline of 1.9  02/2015. Suspected this is A on CRF 2/2 volume depletion on ACE with baseline diabetic nephropathy. However, further w/u uncovered  UPC 11 grams, UA with  6-30 RBC per HPF; repeat 11/2 with TNC RBCs and granular casts.  Albumin 1.5  (1.9 on admission), Serologies: ANA negative.  ANCA titers negative  C3, C4,  HIV, hepatitis panel negative. Single M spike on protein electrophoresis. Kappa/lambda light chains elevated, with high ratio (close to 10). (Oncology plans outpt evaluation). Continue torsemide BID. Sodium bicarb increased to 1300 mg BID 11/9 and bicarb 26 - will back off to 1 TID again.   2. Elevated kappa/lambda light chains, with high ratio (close to 10): Obtained due to past progression of renal disease. Bone survey 11/9 without lytic or blastic lesions. 24-hour urine light chains in process Consulted with oncologist Dr. Alvy Bimler 11/10 who did not recommend further inpatient workup; she is to see patient outpatient with appointment 12/15/15.   2. HTN: SBPs from 126-165. Continue carvedilol 25 mg BID, norvasc 10 mg, and torsemide 20 mg BID. Negative  orthostatic vital. Could consider adding Hydralazine 25 mg TID   3. Anemia:  8.9 today. Hgb as high as 12.5 on 02/11/15 via [place]. Iron studies showed severe deficiency. s/p feraheme 11/2, with repeat dose 11/6.  Aranesp 100 mcg q Friday dosed 11/10. Received 1 u pRBCs 11/5 for hgb to 7.3 and again 11/7 was transfused 1 u pRBCs, heparin discontinued 11/7. SCDs ordered. FOBT negative 11/8. Urine microscopic with 6-30 RBCs 11/9.  4. Left-sided Chest Wall Pain: Acute fractures of left fourth and fifth ribs on bone survey.   5. Renal cysts: Nonobstructive. MRI showed complex renal right renal cortical mass with mural nodule, concerning for renal cell carcinoma. Renal  function precludes contrasted imaging stuies of any type. Will need outpt Urology referral.   6. Elevated troponin: Concern for NSTEMI. Cardiology following. Agree with abstaining from contrast for cath for the time being.   7. Diabetes: Per primary team. Hgb A1c 10.0, worse from hgb A1c 8.1 07/16/15. Home regimen of tradjenta and novolog 70/30 80 U with breakfast, 72 U with dinner. Improved CBGs on 10 U lantus BID, meal coverage and SSI.  8. L pubic ramus fractures: Continue pain management and mobilization with PT. Ortho saw, said to be stable fractures, and recommended outpt followup.  Kerrin Mo, MD [place], PGY-2 11/29/2015, 8:47 AM   I have seen and examined this patient and agree with plan and assessment in the above note with renal recommendations/intervention highlighted. At this point, renal function and volume stable on current diuretics, On Aranesp for anemia, BP reasonable. Workups for elevated free kappa light chains and for his cystic R renal mass with mural nodule will be done outpatient. I wil schedule him for hospital followup with me at [place] when there is better idea of discharge date. At that time will arrange for outpatient Aranesp.  Renal will sign off at this time - please call me if there are additional questions.   Chalet Kerwin B,MD 11/29/2015 1:18 PM

## 2015-11-30 ENCOUNTER — Inpatient Hospital Stay (HOSPITAL_COMMUNITY): Payer: MEDICARE | Admitting: Physical Therapy

## 2015-11-30 ENCOUNTER — Inpatient Hospital Stay (HOSPITAL_COMMUNITY): Payer: MEDICARE | Admitting: Occupational Therapy

## 2015-11-30 ENCOUNTER — Inpatient Hospital Stay (HOSPITAL_COMMUNITY): Payer: PRIVATE HEALTH INSURANCE | Admitting: Occupational Therapy

## 2015-11-30 DIAGNOSIS — T148XXA Other injury of unspecified body region, initial encounter: Secondary | ICD-10-CM

## 2015-11-30 LAB — GLUCOSE, CAPILLARY
GLUCOSE-CAPILLARY: 197 mg/dL — AB (ref 65–99)
GLUCOSE-CAPILLARY: 107 mg/dL — AB (ref 65–99)
GLUCOSE-CAPILLARY: 126 mg/dL — AB (ref 65–99)
GLUCOSE-CAPILLARY: 172 mg/dL — AB (ref 65–99)
GLUCOSE-CAPILLARY: 195 mg/dL — AB (ref 65–99)
GLUCOSE-CAPILLARY: 291 mg/dL — AB (ref 65–99)
Glucose-Capillary: 248 mg/dL — ABNORMAL HIGH (ref 65–99)
Glucose-Capillary: 219 mg/dL — ABNORMAL HIGH (ref 65–99)
Glucose-Capillary: 332 mg/dL — ABNORMAL HIGH (ref 65–99)
Glucose-Capillary: 207 mg/dL — ABNORMAL HIGH (ref 65–99)
Glucose-Capillary: 277 mg/dL — ABNORMAL HIGH (ref 65–99)
Glucose-Capillary: 203 mg/dL — ABNORMAL HIGH (ref 65–99)

## 2015-11-30 LAB — MISC LABCORP TEST (SEND OUT): Labcorp test code: 121228

## 2015-11-30 LAB — RENAL FUNCTION PANEL
Albumin: 1.8 g/dL — ABNORMAL LOW (ref 3.5–5.0)
Anion gap: 10 (ref 5–15)
BUN: 81 mg/dL — AB (ref 6–20)
CHLORIDE: 106 mmol/L (ref 101–111)
CO2: 21 mmol/L — AB (ref 22–32)
CREATININE: 3.69 mg/dL — AB (ref 0.61–1.24)
Calcium: 8.2 mg/dL — ABNORMAL LOW (ref 8.9–10.3)
GFR calc Af Amer: 18 mL/min — ABNORMAL LOW (ref >60)
GFR, EST NON AFRICAN AMERICAN: 16 mL/min — AB (ref >60)
GLUCOSE: 222 mg/dL — AB (ref 65–99)
Phosphorus: 4.5 mg/dL (ref 2.5–4.6)
Potassium: 3.5 mmol/L (ref 3.5–5.1)
Sodium: 137 mmol/L (ref 135–145)

## 2015-11-30 MED ORDER — ZOLPIDEM TARTRATE 5 MG PO TABS
2.5000 mg | ORAL_TABLET | Freq: Every evening | ORAL | Status: DC | PRN
Start: 2015-11-30 — End: 2015-12-02
  Administered 2015-11-30 – 2015-12-01 (×2): 2.5 mg via ORAL
  Filled 2015-11-30 (×2): qty 1

## 2015-11-30 NOTE — Progress Notes (Signed)
Occupational Therapy Note  Patient Details  Name: Dillon Thomas MRN: [id number] Date of Birth: 1950-06-17  Today's Date: 11/30/2015 OT Missed Time: 24 Minutes Missed Time Reason: Patient ill (comment) (pt reported nausea and fatigue)  OT greeted pt in bed with lights off. Pt quickly declined therapy stating he was nauseous and did not feel well. OT encouraged pt to participate in simple ADL tasks, however, pt declined despite encouragement. Pt apologized and stated he would participate in therapy tomorrow with hopes of feeling better.   Daneen Schick Rosalba Totty 11/30/2015, 3:00 PM

## 2015-11-30 NOTE — Progress Notes (Signed)
Patient refused his orthostatic bp, both Probation officer and tech attempted, he would like to wait until later on this morning.

## 2015-11-30 NOTE — Progress Notes (Signed)
Physical Therapy Session Note  Patient Details  Name: Dillon Thomas MRN: [id number] Date of Birth: Feb 09, 1950  Today's Date: 11/30/2015 PT Individual Time: [id number] PT Individual Time Calculation (min): 10 min    Short Term Goals: Week 1:  PT Short Term Goal 1 (Week 1): =LTG due to estimated LOS  Skilled Therapeutic Interventions/Progress Updates:    Pt received in bed noting 2/10 back & L side pain but with significant c/o upset stomach/nausea; RN made aware & nausea medication administered. Encouraged pt to participate in therapy on this date, even participatingin bed level exercises; also educated pt on benefits of participation in therapy tx. Pt declined participation in therapy at this time 2/2 upset stomach. Encouraged pt to eat some food before taking medication as he reported he did not do so this morning; encouraged pt to eat lunch and participate in therapies later today & pt voiced understanding. Pt left in bed with RN present.   Therapy Documentation Precautions:  Precautions Precautions: Fall Restrictions Weight Bearing Restrictions: No   General: PT Amount of Missed Time (min): 50 Minutes PT Missed Treatment Reason: Patient unwilling to participate   See Function Navigator for Current Functional Status.   Therapy/Group: Individual Therapy  Waunita Schooner 11/30/2015, 11:34 AM

## 2015-11-30 NOTE — Progress Notes (Signed)
Physical Therapy Note  Patient Details  Name: SHINJI MEEKER MRN: [id number] Date of Birth: 10-Nov-1950 Today's Date: 11/30/2015    Attempted to see pt at 4:24 pm to make up missed time from earlier this afternoon. Pt declining participation in therapy, even bed level exercises. Pt left in bed with all needs within reach; will follow up per plan of care.   Waunita Schooner 11/30/2015, 5:17 PM

## 2015-11-30 NOTE — Progress Notes (Signed)
Occupational Therapy Session Note  Patient Details  Name: Dillon Thomas MRN: [id number] Date of Birth: Aug 22, 1950  Today's Date: 11/30/2015 OT Individual Time: [id number] OT Individual Time Calculation (min): 9 min  and Today's Date: 11/30/2015 OT Missed Time: 51 Minutes Missed Time Reason: Patient ill (comment);Patient unwilling/refused to participate without medical reason     Short Term Goals: Week 1:  OT Short Term Goal 1 (Week 1): STGs=LTGs based on short estimated LOS  Skilled Therapeutic Interventions/Progress Updates:     Upon entering the room, pt supine in bed with c/o stomach pain. Pt stating, "I just can't do it today." OT offering exercise/therapeutic activity from bed level but pt declined. OT notified RN stated she gave medication prior to OT arrival. Pt remained in bed with all needs within reach.   Therapy Documentation Precautions:  Precautions Precautions: Fall Restrictions Weight Bearing Restrictions: No General: General OT Amount of Missed Time: 73 Minutes PT Missed Treatment Reason: Patient unwilling to participate  See Function Navigator for Current Functional Status.   Therapy/Group: Individual Therapy  Gypsy Decant 11/30/2015, 1:21 PM

## 2015-11-30 NOTE — Progress Notes (Addendum)
[place] PHYSICAL MEDICINE & REHABILITATION     PROGRESS NOTE  Subjective/Complaints:  Pt laying in bed this AM.  He states he cannot sleep, but otherwise had a good weekend.   ROS:  +Sleep disturbance, left rib pain. Denies SOB, N/V/D.  Objective: Vital Signs: Blood pressure (!) 173/66, pulse 65, temperature 97.9 F (36.6 C), temperature source Oral, resp. rate (!) 22, height 5\' 11"  (1.803 m), weight 98.2 kg (216 lb 6.4 oz), SpO2 97 %. No results found.  Recent Labs  11/28/15 0813 11/29/15 0633  WBC 10.8* 11.2*  HGB 9.0* 8.9*  HCT 28.6* 28.2*  PLT 373 367    Recent Labs  11/29/15 0633 11/30/15 0503  NA 138 137  K 3.5 3.5  CL 107 106  GLUCOSE 172* 222*  BUN 75* 81*  CREATININE 3.61* 3.69*  CALCIUM 8.4* 8.2*   CBG (last 3)   Recent Labs  11/29/15 1647 11/29/15 2029 11/30/15 0632  GLUCAP 130* 95 207*    Wt Readings from Last 3 Encounters:  11/24/15 98.2 kg (216 lb 6.4 oz)  11/18/15 101.4 kg (223 lb 8.7 oz)    Physical Exam:  BP (!) 173/66   Pulse 65   Temp 97.9 F (36.6 C) (Oral)   Resp (!) 22   Ht 5\' 11"  (1.803 m)   Wt 98.2 kg (216 lb 6.4 oz)   SpO2 97%   BMI 30.18 kg/m  Constitutional: He appears well-developed. NAD. Obese. HENT: Normocephalicand atraumatic.  Eyes: EOMare normal. No discharge. Cardiovascular: RRR. No JVD. Respiratory: Effort normaland breath sounds normal. No respiratory distress.  GI: Soft. Bowel sounds are normal. He exhibits no distension.  Musculoskeletal: He exhibits edema. He exhibits no tenderness.  Neurological: He is alert and oriented.  Motor: LUE: 4+/5 proximal to distal (stable) RUE: 4+/5 proximal to distal RLE: hip flexion 4+/5, knee extension 4+/5, ankle dorsi/plantar flexion 5/5 LLE; Hip flexion 4/5, knee extension 4+/5, ankle dorsi/plantar flexion 5/5(stable) Skin: Reported sacral ulcer. Psychiatric: Mood, affect, and behavior, relatively normal.  Assessment/Plan: 1. Functional deficits secondary to  debilitation which require 3+ hours per day of interdisciplinary therapy in a comprehensive inpatient rehab setting. Physiatrist is providing close team supervision and 24 hour management of active medical problems listed below. Physiatrist and rehab team continue to assess barriers to discharge/monitor patient progress toward functional and medical goals.  Function:  Bathing Bathing position   Position: Shower  Bathing parts Body parts bathed by patient: Right arm, Left arm, Chest, Abdomen, Front perineal area, Right upper leg, Left upper leg, Right lower leg, Left lower leg, Back Body parts bathed by helper: Buttocks  Bathing assist Assist Level: Touching or steadying assistance(Pt > 75%)      Upper Body Dressing/Undressing Upper body dressing   What is the patient wearing?: Pull over shirt/dress     Pull over shirt/dress - Perfomed by patient: Thread/unthread right sleeve, Thread/unthread left sleeve, Put head through opening Pull over shirt/dress - Perfomed by helper: Put head through opening        Upper body assist Assist Level: Supervision or verbal cues      Lower Body Dressing/Undressing Lower body dressing   What is the patient wearing?: Underwear, Pants, Non-skid slipper socks, Ted Hose Underwear - Performed by patient: Thread/unthread right underwear leg, Thread/unthread left underwear leg, Pull underwear up/down   Pants- Performed by patient: Thread/unthread right pants leg, Thread/unthread left pants leg, Pull pants up/down Pants- Performed by helper: Thread/unthread right pants leg, Thread/unthread left pants leg  Non-skid slipper socks- Performed by helper: Don/doff right sock, Don/doff left sock               TED Hose - Performed by helper: Don/doff right TED hose, Don/doff left TED hose  Lower body assist Assist for lower body dressing: Touching or steadying assistance (Pt > 75%)      Toileting Toileting Toileting activity did not occur: No continent  bowel/bladder event   Toileting steps completed by helper: Performs perineal hygiene Toileting Assistive Devices: Grab bar or rail  Toileting assist Assist level: Touching or steadying assistance (Pt.75%)   Transfers Chair/bed transfer   Chair/bed transfer method: Stand pivot Chair/bed transfer assist level: Touching or steadying assistance (Pt > 75%) Chair/bed transfer assistive device: Medical sales representative     Max distance: 93 Assist level: Touching or steadying assistance (Pt > 75%)   Wheelchair   Type: Manual Max wheelchair distance: 150 Assist Level: Supervision or verbal cues  Cognition Comprehension Comprehension assist level: Follows complex conversation/direction with no assist  Expression Expression assist level: Expresses basic needs/ideas: With no assist  Social Interaction Social Interaction assist level: Interacts appropriately 75 - 89% of the time - Needs redirection for appropriate language or to initiate interaction.  Problem Solving Problem solving assist level: Solves basic problems with no assist  Memory Memory assist level: More than reasonable amount of time     Medical Problem List and Plan: 1.  Debilitation/acute encephalopathy secondary to DKA/NSTEMI--plan cardiac catheterization 2-4 weeks per Dr. Einar Gip  Cont CIR  Appreciate Cards recs 2.  DVT Prophylaxis/Anticoagulation: Subcutaneous heparin. Monitor platelet counts and any signs of bleeding 3. Pain Management: Hydrocodone as needed 4. Mood: Aricept 10 mg daily at bedtime 5. Neuropsych: This patient is capable of making decisions on his own behalf. 6. Skin/Wound Care: Sacral ulcer per report, pt noncompliant with pressure relief. 7. Fluids/Electrolytes/Nutrition: Routine I&Os 8. CKD-3 with baseline creatinine 2.00. Renal service continued to follow with workup ongoing. Demadex initiated 11/20/1998 1740 mg twice a day.   Cr. 3.69 on 11/13, ?new baseline  Appreciate nephro recs 9.  Uncontrolled hypertension.   Being managed by Neprho, appreciate recs 10. Diabetes mellitus with peripheral neuropathy. Hemoglobin A1c of 10. Home regimen of trandjenta and NovoLog 70/30 80 units at breakfast, 72 units with dinner  Lantus insulin 10 units twice a day, changed to daily on 11/10.   Novolog 4U TID, increased to 8U TID on 11/10  Check blood sugars before meals and at bedtime.   Labile at present, will cont to monitor 11. Hyperlipidemia. Crestor 12. Hypothyroidism.TSH 2.954. Continue synthroid 13. Acute on chronic anemia.   Continue Aranesp, FeSO4/Vit C added 11/8  Hb 8.9 on 11/13  Will cont to monitor 14. Cervical spondylosis: Monitor 15. Leukocytosis:   WBCs 11.2 on 11/12 (stable)  Cont to monitor  UA with rare bacteria, negative WBCs, Ucx, multi-species, no need to re-collect  No shortness of breath. No breathing issues, no chest x-ray needed at the current time 16. Left chest discomfort  Improving  Angina +/- pleural effusion  ECG reviewed, troponin reviewed, improved 17. Sleep disturbance  Failed single therapy with trazodone  Trazodone reduced to 50 mg daily at bedtime when necessary   Restoril 7.5 milligrams at nightime ineffective, d/ced  Ambien started 11/13 18. Hypoalbuminemia   Supplement started 11/8 19. Pleural effusion  Diuresis increased by Nephro  Protein supplementation 20. Left rib fractures  Bone scan, results reviewed, conservative care 21. Left pubic rami fractures  Ortho  recommends follow-up 2 weeks in the office. No weightbearing limitations 22. Elevated light chains  Evaluated by Nephro and Heme/Onc  Heme/Onc outpt follow up 23. Renal cysts  Urology as outpt  LOS (Days) 7 A FACE TO FACE EVALUATION WAS PERFORMED  Dillon Thomas Lorie Phenix 11/30/2015 8:22 AM

## 2015-12-01 ENCOUNTER — Inpatient Hospital Stay (HOSPITAL_COMMUNITY): Payer: MEDICARE | Admitting: Physical Therapy

## 2015-12-01 ENCOUNTER — Inpatient Hospital Stay (HOSPITAL_COMMUNITY): Payer: PRIVATE HEALTH INSURANCE

## 2015-12-01 ENCOUNTER — Inpatient Hospital Stay (HOSPITAL_COMMUNITY): Payer: MEDICARE | Admitting: Occupational Therapy

## 2015-12-01 DIAGNOSIS — E876 Hypokalemia: Secondary | ICD-10-CM

## 2015-12-01 LAB — RENAL FUNCTION PANEL
ALBUMIN: 1.7 g/dL — AB (ref 3.5–5.0)
ANION GAP: 10 (ref 5–15)
BUN: 75 mg/dL — AB (ref 6–20)
CO2: 22 mmol/L (ref 22–32)
Calcium: 8.5 mg/dL — ABNORMAL LOW (ref 8.9–10.3)
Chloride: 105 mmol/L (ref 101–111)
Creatinine, Ser: 3.58 mg/dL — ABNORMAL HIGH (ref 0.61–1.24)
GFR calc Af Amer: 19 mL/min — ABNORMAL LOW (ref >60)
GFR calc non Af Amer: 16 mL/min — ABNORMAL LOW (ref >60)
GLUCOSE: 253 mg/dL — AB (ref 65–99)
PHOSPHORUS: 4.4 mg/dL (ref 2.5–4.6)
POTASSIUM: 3.4 mmol/L — AB (ref 3.5–5.1)
SODIUM: 137 mmol/L (ref 135–145)

## 2015-12-01 LAB — GLUCOSE, CAPILLARY
GLUCOSE-CAPILLARY: 312 mg/dL — AB (ref 65–99)
GLUCOSE-CAPILLARY: 213 mg/dL — AB (ref 65–99)
Glucose-Capillary: 228 mg/dL — ABNORMAL HIGH (ref 65–99)
Glucose-Capillary: 240 mg/dL — ABNORMAL HIGH (ref 65–99)

## 2015-12-01 MED ORDER — POTASSIUM CHLORIDE 20 MEQ PO PACK
20.0000 meq | PACK | Freq: Two times a day (BID) | ORAL | Status: AC
  Administered 2015-12-01 (×2): 20 meq via ORAL
  Filled 2015-12-01 (×2): qty 1

## 2015-12-01 MED ORDER — HYDRALAZINE HCL 50 MG PO TABS
50.0000 mg | ORAL_TABLET | Freq: Three times a day (TID) | ORAL | Status: DC
  Administered 2015-12-01 – 2015-12-04 (×9): 50 mg via ORAL
  Filled 2015-12-01 (×9): qty 1

## 2015-12-01 MED ORDER — INSULIN ASPART 100 UNIT/ML ~~LOC~~ SOLN: 7.0000 [IU] | Freq: Three times a day (TID) | SUBCUTANEOUS | Status: DC

## 2015-12-01 NOTE — Progress Notes (Signed)
Occupational Therapy Session Note  Patient Details  Name: AIZEN MALO MRN: [id number] Date of Birth: October 06, 1950  Today's Date: 12/01/2015 OT Individual Time: 1300-1331 OT Individual Time Calculation (min): 31 min  29 missed minutes secondary to pt refusal   Short Term Goals: Week 1:  OT Short Term Goal 1 (Week 1): STGs=LTGs based on short estimated LOS  Skilled Therapeutic Interventions/Progress Updates:     Upon entering the room, pt seated in recliner chair stating he felt much better. Pt agreeable to OT intervention but needing encouragement for mobility. Pt performed sit >stand from recliner chair with steady assistance.Pt ambulating 10' into bathroom with RW and steady assistance. Pt sitting on shower chair and verbalizing, "I can't do this. I want to go back to bed." Pt declined further OT intervention. Pt ambulating back to bed with steady assistance. Pt performed sit >supine with close supervision. Call bell and all needed items within reach upon exiting the room.   Therapy Documentation Precautions:  Precautions Precautions: Fall Restrictions Weight Bearing Restrictions: No  Pain: Pain Assessment Pain Assessment: 0-10 Pain Score: 2  Pain Type: Acute pain Pain Location: Buttocks Pain Descriptors / Indicators: Sore Pain Onset: On-going Pain Intervention(s): Repositioned (offered OOB pt declined)  See Function Navigator for Current Functional Status.   Therapy/Group: Individual Therapy  Gypsy Decant 12/01/2015, 1:39 PM

## 2015-12-01 NOTE — Progress Notes (Signed)
Physical Therapy Session Note  Patient Details  Name: Dillon Thomas MRN: [id number] Date of Birth: 06/03/1950  Today's Date: 12/01/2015 PT Individual Time: 1000-1053 PT Individual Time Calculation (min): 53 min    Short Term Goals: Week 1:  PT Short Term Goal 1 (Week 1): =LTG due to estimated LOS  Skilled Therapeutic Interventions/Progress Updates:    Patient in bed upon arrival, reporting feeling better than yesterday. Patient stated he already got up with therapy this morning and requesting to perform bed level exercises due to fatigue from not sleeping at night. Encouraged OOB activity for pressure relief due to buttocks pain but patient declined. Patient repositioned himself in bed with use of hospital bed functions. Instructed in supine BLE therex for strengthening and muscular endurance: glute sets and quad sets x 20 each, ankle pumps to fatigue, heel slides x 10 each LE, SLR x 10 each LE with assist for LLE, hooklying isometric hip adduction x 20, bridging 4 reps x 2. Patient required multiple rest breaks and increased time to complete all exercises due to pain and fatigue. Patient left semi reclined in bed with 4 rails up and all needs within reach.     Therapy Documentation Precautions:  Precautions Precautions: Fall Restrictions Weight Bearing Restrictions: No General: PT Amount of Missed Time (min): 20 Minutes PT Missed Treatment Reason: Unavailable (Comment) (Pt requesting to eat breakfast) Pain: Pain Assessment Pain Assessment: 0-10 Pain Score: 2  Pain Type: Acute pain Pain Location: Buttocks Pain Descriptors / Indicators: Sore Pain Onset: On-going Pain Intervention(s): Repositioned (offered OOB pt declined)   See Function Navigator for Current Functional Status.   Therapy/Group: Individual Therapy  Laretta Alstrom 12/01/2015, 10:58 AM

## 2015-12-01 NOTE — Progress Notes (Signed)
[place] PHYSICAL MEDICINE & REHABILITATION     PROGRESS NOTE  Subjective/Complaints:  Pt seen laying in bed this AM.  He had some bleeding from a small skin tear in right forearm and is really concerned that he is going to "bleed to death".  He states he slept better.    ROS:  +Right forearm bleeding. Denies SOB, N/V/D.  Objective: Vital Signs: Blood pressure (!) 169/64, pulse 67, temperature 98.3 F (36.8 C), temperature source Oral, resp. rate 19, height 5\' 11"  (1.803 m), weight 98.2 kg (216 lb 6.4 oz), SpO2 96 %. No results found.  Recent Labs  11/28/15 0813 11/29/15 0633  WBC 10.8* 11.2*  HGB 9.0* 8.9*  HCT 28.6* 28.2*  PLT 373 367    Recent Labs  11/30/15 0503 12/01/15 0509  NA 137 137  K 3.5 3.4*  CL 106 105  GLUCOSE 222* 253*  BUN 81* 75*  CREATININE 3.69* 3.58*  CALCIUM 8.2* 8.5*   CBG (last 3)   Recent Labs  11/30/15 1700 11/30/15 2112 12/01/15 0641  GLUCAP 203* 291* 228*    Wt Readings from Last 3 Encounters:  11/24/15 98.2 kg (216 lb 6.4 oz)  11/18/15 101.4 kg (223 lb 8.7 oz)    Physical Exam:  BP (!) 169/64 (BP Location: Left Arm)   Pulse 67   Temp 98.3 F (36.8 C) (Oral)   Resp 19   Ht 5\' 11"  (1.803 m)   Wt 98.2 kg (216 lb 6.4 oz)   SpO2 96%   BMI 30.18 kg/m  Constitutional: He appears well-developed. NAD. Obese. HENT: Normocephalicand atraumatic.  Eyes: EOMare normal. No discharge. Cardiovascular: RRR. No JVD. Respiratory: Effort normaland breath sounds normal. No respiratory distress.  GI: Soft. Bowel sounds are normal. He exhibits no distension.  Musculoskeletal: He exhibits edema. He exhibits no tenderness.  Neurological: He is alert and oriented.  Motor: LUE: 4+/5 proximal to distal (unchanged) RUE: 4+/5 proximal to distal RLE: hip flexion 4+/5, knee extension 4+/5, ankle dorsi/plantar flexion 5/5 LLE; Hip flexion 4/5, knee extension 4+/5, ankle dorsi/plantar flexion 5/5 Skin: Reported sacral ulcer. Right forearm skin  tear. Psychiatric: Mood, affect, and behavior, relatively normal.  Assessment/Plan: 1. Functional deficits secondary to debilitation which require 3+ hours per day of interdisciplinary therapy in a comprehensive inpatient rehab setting. Physiatrist is providing close team supervision and 24 hour management of active medical problems listed below. Physiatrist and rehab team continue to assess barriers to discharge/monitor patient progress toward functional and medical goals.  Function:  Bathing Bathing position   Position: Shower  Bathing parts Body parts bathed by patient: Right arm, Left arm, Chest, Abdomen, Front perineal area, Right upper leg, Left upper leg, Right lower leg, Left lower leg, Back Body parts bathed by helper: Buttocks  Bathing assist Assist Level: Touching or steadying assistance(Pt > 75%)      Upper Body Dressing/Undressing Upper body dressing   What is the patient wearing?: Pull over shirt/dress     Pull over shirt/dress - Perfomed by patient: Thread/unthread right sleeve, Thread/unthread left sleeve, Put head through opening Pull over shirt/dress - Perfomed by helper: Put head through opening        Upper body assist Assist Level: Supervision or verbal cues      Lower Body Dressing/Undressing Lower body dressing   What is the patient wearing?: Underwear, Pants, Non-skid slipper socks, Ted Hose Underwear - Performed by patient: Thread/unthread right underwear leg, Thread/unthread left underwear leg, Pull underwear up/down   Pants- Performed by  patient: Thread/unthread right pants leg, Thread/unthread left pants leg, Pull pants up/down Pants- Performed by helper: Thread/unthread right pants leg, Thread/unthread left pants leg   Non-skid slipper socks- Performed by helper: Don/doff right sock, Don/doff left sock               TED Hose - Performed by helper: Don/doff right TED hose, Don/doff left TED hose  Lower body assist Assist for lower body  dressing: Touching or steadying assistance (Pt > 75%)      Toileting Toileting Toileting activity did not occur: No continent bowel/bladder event   Toileting steps completed by helper: Performs perineal hygiene Toileting Assistive Devices: Grab bar or rail  Toileting assist Assist level: Touching or steadying assistance (Pt.75%)   Transfers Chair/bed transfer   Chair/bed transfer method: Stand pivot Chair/bed transfer assist level: Touching or steadying assistance (Pt > 75%) Chair/bed transfer assistive device: Medical sales representative     Max distance: 93 Assist level: Touching or steadying assistance (Pt > 75%)   Wheelchair   Type: Manual Max wheelchair distance: 150 Assist Level: Supervision or verbal cues  Cognition Comprehension Comprehension assist level: Follows complex conversation/direction with no assist  Expression Expression assist level: Expresses basic needs/ideas: With no assist  Social Interaction Social Interaction assist level: Interacts appropriately 75 - 89% of the time - Needs redirection for appropriate language or to initiate interaction.  Problem Solving Problem solving assist level: Solves basic problems with no assist  Memory Memory assist level: More than reasonable amount of time     Medical Problem List and Plan: 1.  Debilitation/acute encephalopathy secondary to DKA/NSTEMI--plan cardiac catheterization 2-4 weeks per Dr. Einar Gip  Cont CIR  Appreciate Cards recs 2.  DVT Prophylaxis/Anticoagulation: Subcutaneous heparin. Monitor platelet counts and any signs of bleeding 3. Pain Management: Hydrocodone as needed 4. Mood: Aricept 10 mg daily at bedtime 5. Neuropsych: This patient is capable of making decisions on his own behalf. 6. Skin/Wound Care: Sacral ulcer per report, pt noncompliant with pressure relief. 7. Fluids/Electrolytes/Nutrition: Routine I&Os 8. CKD-3 with baseline creatinine 2.00. Renal service continued to follow with workup  ongoing. Demadex initiated 11/20/1998 1740 mg twice a day.   Cr. 3.58 on 11/14, ?new baseline  Appreciate nephro recs 9. Uncontrolled hypertension.   Was being managed by Neprho, appreciate recs, have now signed off  Cont Norvasc 10, Coreg 25  Hydralazine increased to 50 on 11/14 10. Diabetes mellitus with peripheral neuropathy. Hemoglobin A1c of 10. Home regimen of trandjenta and NovoLog 70/30 80 units at breakfast, 72 units with dinner  Lantus insulin 10 units twice a day, changed to daily on 11/10.   Novolog 4U TID, increased to 7U TID on 11/14  Check blood sugars before meals and at bedtime.   Will cont to monitor 11. Hyperlipidemia. Crestor 12. Hypothyroidism.TSH 2.954. Continue synthroid 13. Acute on chronic anemia.   Continue Aranesp, FeSO4/Vit C added 11/8  Hb 8.9 on 11/13  Labs ordered for tomorrow  Will cont to monitor 14. Cervical spondylosis: Monitor 15. Leukocytosis:   WBCs 11.2 on 11/12 (stable)  Labs ordered for tomorrow  Cont to monitor  UA with rare bacteria, negative WBCs, Ucx, multi-species, no need to re-collect  No shortness of breath. No breathing issues, no chest x-ray needed at the current time 16. Left chest discomfort  Improving  Angina +/- pleural effusion  ECG reviewed, troponin reviewed, improved 17. Sleep disturbance  Failed single therapy with trazodone  Trazodone reduced to 50 mg daily  at bedtime when necessary   Restoril 7.5 milligrams at nightime ineffective, d/ced  Ambien started 11/13  ?Improving 18. Hypoalbuminemia   Supplement started 11/8 19. Pleural effusion  Diuresis increased by Nephro  Protein supplementation 20. Left rib fractures  Bone scan, results reviewed, conservative care 21. Left pubic rami fractures  Ortho  recommends follow-up 2 weeks in the office. No weightbearing limitations 22. Elevated light chains  Evaluated by Nephro and Heme/Onc  Heme/Onc outpt follow up 23. Renal cysts  Urology as outpt 24.  Hypokalemia  K+ 3.4 on 11/14, supplemented  LOS (Days) 8 A FACE TO FACE EVALUATION WAS PERFORMED  Alessandro Griep Lorie Phenix 12/01/2015 8:08 AM

## 2015-12-01 NOTE — Progress Notes (Signed)
Social Work Patient ID: Dillon Thomas, male   DOB: 19-Sep-1950, 65 y.o.   MRN: [id number]   [place] spoke with pt to update him on team conference discussion and then later spoke with his fiance, Santiago Glad.  She is pleased with pt's targeted d/c date and plans to be with him when he comes home.  CSW explained that we would talk about DME and f/u therapies when we get closer to d/c.  She expressed understanding and appreciation.

## 2015-12-01 NOTE — Progress Notes (Signed)
Physical Therapy Session Note  Patient Details  Name: Dillon Thomas MRN: [id number] Date of Birth: 1950/02/09  Today's Date: 12/01/2015 PT Individual Time: 0820-0900 PT Individual Time Calculation (min): 40 min    Short Term Goals: Week 1:  PT Short Term Goal 1 (Week 1): =LTG due to estimated LOS  Skilled Therapeutic Interventions/Progress Updates:    Session started late per patient request to eat some of his breakfast - upset how it was handled yesterday and today. Pt then agreeable to participate and engage in discussion in regards to d/c planning and importance of therapies. Pt reports he is not ready for d/c on 11/17 which was the date set. He is concerned about his arm bleeding and overall him and his girlfriend not feeling ready. Discussed home set-up and pt wanting hospital bed but his girlfriend refusing to have that in her home. Pt reliant this AM on use of bed rails and adjusting HOB in order to get to EOB with significant amount of extra time. Completed sit <> stands with light steadying assist with RW and cues for hand placement. Gait training with RW with focus on endurance and general strengthening x 120' x 2 with extended seated rest break. Pt with intermittent min assist needed for mild LOB during turns. End of session set up in recliner for increasing OOB tolerance with all needs in reach.    Therapy Documentation Precautions:  Precautions Precautions: Fall Restrictions Weight Bearing Restrictions: No General: PT Amount of Missed Time (min): 20 Minutes PT Missed Treatment Reason: Unavailable (Comment) (Pt requesting to eat breakfast)   Pain:  2/10 back pain. Reports stomach is feeling much better today.   See Function Navigator for Current Functional Status.   Therapy/Group: Individual Therapy  Canary Brim Ivory Broad, PT, DPT  12/01/2015, 9:35 AM

## 2015-12-02 ENCOUNTER — Inpatient Hospital Stay (HOSPITAL_COMMUNITY): Payer: PRIVATE HEALTH INSURANCE | Admitting: Physical Therapy

## 2015-12-02 ENCOUNTER — Inpatient Hospital Stay (HOSPITAL_COMMUNITY): Payer: PRIVATE HEALTH INSURANCE | Admitting: Occupational Therapy

## 2015-12-02 ENCOUNTER — Inpatient Hospital Stay (HOSPITAL_COMMUNITY): Payer: MEDICARE | Admitting: Occupational Therapy

## 2015-12-02 LAB — CBC WITH DIFFERENTIAL/PLATELET
BASOS PCT: 0 %
Basophils Absolute: 0 10*3/uL (ref 0.0–0.1)
Eosinophils Absolute: 0.2 10*3/uL (ref 0.0–0.7)
Eosinophils Relative: 1 %
HEMATOCRIT: 26.3 % — AB (ref 39.0–52.0)
HEMOGLOBIN: 8.3 g/dL — AB (ref 13.0–17.0)
LYMPHS ABS: 3.3 10*3/uL (ref 0.7–4.0)
LYMPHS PCT: 28 %
MCH: 29 pg (ref 26.0–34.0)
MCHC: 31.6 g/dL (ref 30.0–36.0)
MCV: 92 fL (ref 78.0–100.0)
MONO ABS: 2.6 10*3/uL — AB (ref 0.1–1.0)
MONOS PCT: 22 %
NEUTROS ABS: 5.9 10*3/uL (ref 1.7–7.7)
NEUTROS PCT: 49 %
Platelets: 320 10*3/uL (ref 150–400)
RBC: 2.86 MIL/uL — ABNORMAL LOW (ref 4.22–5.81)
RDW: 16.3 % — ABNORMAL HIGH (ref 11.5–15.5)
WBC: 12 10*3/uL — ABNORMAL HIGH (ref 4.0–10.5)

## 2015-12-02 LAB — GLUCOSE, CAPILLARY
GLUCOSE-CAPILLARY: 176 mg/dL — AB (ref 65–99)
GLUCOSE-CAPILLARY: 143 mg/dL — AB (ref 65–99)
Glucose-Capillary: 222 mg/dL — ABNORMAL HIGH (ref 65–99)
Glucose-Capillary: 267 mg/dL — ABNORMAL HIGH (ref 65–99)

## 2015-12-02 LAB — BASIC METABOLIC PANEL
ANION GAP: 11 (ref 5–15)
BUN: 80 mg/dL — ABNORMAL HIGH (ref 6–20)
CALCIUM: 8.4 mg/dL — AB (ref 8.9–10.3)
CHLORIDE: 104 mmol/L (ref 101–111)
CO2: 22 mmol/L (ref 22–32)
Creatinine, Ser: 4 mg/dL — ABNORMAL HIGH (ref 0.61–1.24)
GFR calc non Af Amer: 14 mL/min — ABNORMAL LOW (ref >60)
GFR, EST AFRICAN AMERICAN: 17 mL/min — AB (ref >60)
GLUCOSE: 235 mg/dL — AB (ref 65–99)
POTASSIUM: 4.1 mmol/L (ref 3.5–5.1)
Sodium: 137 mmol/L (ref 135–145)

## 2015-12-02 MED ORDER — ZOLPIDEM TARTRATE 5 MG PO TABS
5.0000 mg | ORAL_TABLET | Freq: Every evening | ORAL | Status: DC | PRN
  Administered 2015-12-03: 5 mg via ORAL
  Filled 2015-12-02: qty 1

## 2015-12-02 MED ORDER — INSULIN GLARGINE 100 UNIT/ML ~~LOC~~ SOLN
15.0000 [IU] | Freq: Every day | SUBCUTANEOUS | Status: DC
  Administered 2015-12-02 – 2015-12-04 (×3): 15 [IU] via SUBCUTANEOUS
  Filled 2015-12-02 (×4): qty 0.15

## 2015-12-02 MED ORDER — INSULIN GLARGINE 100 UNIT/ML ~~LOC~~ SOLN: 15.0000 [IU] | Freq: Every day | SUBCUTANEOUS | Status: DC

## 2015-12-02 MED ORDER — INSULIN ASPART 100 UNIT/ML ~~LOC~~ SOLN
9.0000 [IU] | Freq: Three times a day (TID) | SUBCUTANEOUS | Status: DC
  Administered 2015-12-02 – 2015-12-04 (×6): 9 [IU] via SUBCUTANEOUS

## 2015-12-02 NOTE — Progress Notes (Signed)
Physical Therapy Session Note  Patient Details  Name: Dillon Thomas MRN: [id number] Date of Birth: 1950-05-14  Today's Date: 12/02/2015 PT Individual Time: 1300-1330 PT Individual Time Calculation (min): 30 min    Short Term Goals: Week 1:  PT Short Term Goal 1 (Week 1): =LTG due to estimated LOS  Skilled Therapeutic Interventions/Progress Updates:   Tx 1: Pt received supine in bed, agreeable to treatment and reports 2/10 pain in buttocks. Supine>sit with pt managing hospital bed controls to elevate HOB and S overall. Sit <>stand from EOB with S and RW, required assist to retrieve urinal and then able to place and hold while urinating without assist from therapist, S for standing balance. Gait with RW 2x 95' with S with slow speed and occasional short standing rest breaks. While resting in ADL apartment, discussed d/c plan and pt concerns about going home alone; pt reports he is most concerned about bleeding if he were to fall or cut himself. Pt declined to perform bed mobility on flat bed, as well as declined performing couch transfer due to low height despite education regarding functional carryover to home environment. Returned to room with gait as above, and sit >supine with S as above. Remained supine in bed at end of session, all needs in reach.    Therapy Documentation Precautions:  Precautions Precautions: Fall Restrictions Weight Bearing Restrictions: No   See Function Navigator for Current Functional Status.   Therapy/Group: Individual Therapy  Luberta Mutter 12/02/2015, 2:20 PM

## 2015-12-02 NOTE — Plan of Care (Signed)
Problem: RH Balance Goal: LTG: Patient will maintain dynamic sitting balance (OT) LTG:  Patient will maintain dynamic sitting balance with assistance during activities of daily living (OT)  Downgraded secondary to decreased progress and participation Goal: LTG Patient will maintain dynamic standing with ADLs (OT) LTG:  Patient will maintain dynamic standing balance with assist during activities of daily living (OT)   Downgraded secondary to decreased progress and participation  Problem: RH Grooming Goal: LTG Patient will perform grooming w/assist,cues/equip (OT) LTG: Patient will perform grooming with assist, with/without cues using equipment (OT)  Downgraded secondary to decreased progress and participation  Problem: RH Bathing Goal: LTG Patient will bathe with assist, cues/equipment (OT) LTG: Patient will bathe specified number of body parts with assist with/without cues using equipment (position)  (OT)  Downgraded secondary to decreased progress and participation  Problem: RH Dressing Goal: LTG Patient will perform upper body dressing (OT) LTG Patient will perform upper body dressing with assist, with/without cues (OT).  Downgraded secondary to decreased progress and participation  Problem: RH Laundry Goal: LTG Patient will perform laundry w/assist, cues (OT) LTG: Patient will perform laundry with assistance, with/without cues (OT).  Downgraded secondary to decreased progress and participation  Problem: RH Toilet Transfers Goal: LTG Patient will perform toilet transfers w/assist (OT) LTG: Patient will perform toilet transfers with assist, with/without cues using equipment (OT)  Downgraded secondary to decreased progress and participation

## 2015-12-02 NOTE — Progress Notes (Signed)
[facility] PHYSICAL MEDICINE & REHABILITATION     PROGRESS NOTE  Subjective/Complaints:  Pt seen laying in bed this AM.  He states he did not sleep well again.  He notes decreased drainage from forearm, but pain with blood draw site this AM.    ROS:  +Right hand pain. Denies SOB, N/V/D.  Objective: Vital Signs: Blood pressure (!) 173/70, pulse 79, temperature 98.1 F (36.7 C), temperature source Oral, resp. rate 18, height 5\' 11"  (1.803 m), weight 87.1 kg (192 lb), SpO2 93 %. No results found.  Recent Labs  12/02/15 0636  WBC 12.0*  HGB 8.3*  HCT 26.3*  PLT 320    Recent Labs  11/30/15 0503 12/01/15 0509  NA 137 137  K 3.5 3.4*  CL 106 105  GLUCOSE 222* 253*  BUN 81* 75*  CREATININE 3.69* 3.58*  CALCIUM 8.2* 8.5*   CBG (last 3)   Recent Labs  12/01/15 1649 12/01/15 2023 12/02/15 0626  GLUCAP 213* 240* 222*    Wt Readings from Last 3 Encounters:  12/02/15 87.1 kg (192 lb)  11/18/15 101.4 kg (223 lb 8.7 oz)    Physical Exam:  BP (!) 173/70 (BP Location: Left Arm)   Pulse 79   Temp 98.1 F (36.7 C) (Oral)   Resp 18   Ht 5\' 11"  (1.803 m)   Wt 87.1 kg (192 lb)   SpO2 93%   BMI 26.78 kg/m  Constitutional: He appears well-developed. NAD. Obese. HENT: Normocephalicand atraumatic.  Eyes: EOMare normal. No discharge. Cardiovascular: RRR. No JVD. Respiratory: Effort normaland breath sounds normal. No respiratory distress.  GI: Soft. Bowel sounds are normal. He exhibits no distension.  Musculoskeletal: He exhibits edema and tenderness in left hand.  Neurological: He is alert and oriented.  Motor: LUE: 4+/5 proximally, distally limited by pain RUE: 4+/5 proximal to distal RLE: hip flexion 4+/5, knee extension 4+/5, ankle dorsi/plantar flexion 5/5 LLE; Hip flexion 4/5, knee extension 4+/5, ankle dorsi/plantar flexion 5/5 Skin: Reported sacral ulcer. Right forearm skin tear healing. Psychiatric: Mood, affect, and behavior, relatively  normal.  Assessment/Plan: 1. Functional deficits secondary to debilitation which require 3+ hours per day of interdisciplinary therapy in a comprehensive inpatient rehab setting. Physiatrist is providing close team supervision and 24 hour management of active medical problems listed below. Physiatrist and rehab team continue to assess barriers to discharge/monitor patient progress toward functional and medical goals.  Function:  Bathing Bathing position   Position: Shower  Bathing parts Body parts bathed by patient: Right arm, Left arm, Chest, Abdomen, Front perineal area, Right upper leg, Left upper leg, Right lower leg, Left lower leg, Back Body parts bathed by helper: Buttocks  Bathing assist Assist Level: Touching or steadying assistance(Pt > 75%)      Upper Body Dressing/Undressing Upper body dressing   What is the patient wearing?: Pull over shirt/dress     Pull over shirt/dress - Perfomed by patient: Thread/unthread right sleeve, Thread/unthread left sleeve, Put head through opening Pull over shirt/dress - Perfomed by helper: Put head through opening        Upper body assist Assist Level: Supervision or verbal cues      Lower Body Dressing/Undressing Lower body dressing   What is the patient wearing?: Underwear, Pants, Non-skid slipper socks, Ted Hose Underwear - Performed by patient: Thread/unthread right underwear leg, Thread/unthread left underwear leg, Pull underwear up/down   Pants- Performed by patient: Thread/unthread right pants leg, Thread/unthread left pants leg, Pull pants up/down Pants- Performed by helper:  Thread/unthread right pants leg, Thread/unthread left pants leg   Non-skid slipper socks- Performed by helper: Don/doff right sock, Don/doff left sock               TED Hose - Performed by helper: Don/doff right TED hose, Don/doff left TED hose  Lower body assist Assist for lower body dressing: Touching or steadying assistance (Pt > 75%)       Toileting Toileting Toileting activity did not occur: No continent bowel/bladder event   Toileting steps completed by helper: Adjust clothing prior to toileting, Performs perineal hygiene, Adjust clothing after toileting Toileting Assistive Devices: Grab bar or rail  Toileting assist Assist level: Touching or steadying assistance (Pt.75%)   Transfers Chair/bed transfer   Chair/bed transfer method: Ambulatory Chair/bed transfer assist level: Touching or steadying assistance (Pt > 75%) Chair/bed transfer assistive device: Medical sales representative     Max distance: 120' Assist level: Touching or steadying assistance (Pt > 75%)   Wheelchair   Type: Manual Max wheelchair distance: 150 Assist Level: Supervision or verbal cues  Cognition Comprehension Comprehension assist level: Follows complex conversation/direction with no assist  Expression Expression assist level: Expresses basic needs/ideas: With no assist  Social Interaction Social Interaction assist level: Interacts appropriately 75 - 89% of the time - Needs redirection for appropriate language or to initiate interaction.  Problem Solving Problem solving assist level: Solves basic problems with no assist  Memory Memory assist level: More than reasonable amount of time     Medical Problem List and Plan: 1.  Debilitation/acute encephalopathy secondary to DKA/NSTEMI--plan cardiac catheterization 2-4 weeks per Dr. Einar Gip  Cont CIR  Appreciate Cards recs 2.  DVT Prophylaxis/Anticoagulation: Subcutaneous heparin. Monitor platelet counts and any signs of bleeding 3. Pain Management: Hydrocodone as needed 4. Mood: Aricept 10 mg daily at bedtime 5. Neuropsych: This patient is capable of making decisions on his own behalf. 6. Skin/Wound Care: Sacral ulcer per report, pt noncompliant with pressure relief. 7. Fluids/Electrolytes/Nutrition: Routine I&Os 8. CKD-3 with baseline creatinine 2.00. Renal service continued to follow  with workup ongoing. Demadex initiated 11/20/1998 1740 mg twice a day.   Cr. 3.58 on 11/14, ?new baseline  Appreciate nephro recs 9. Uncontrolled hypertension.   Was being managed by Neprho, appreciate recs, have now signed off  Cont Norvasc 10, Coreg 25  Hydralazine increased to 50 on 11/14  Will consider further increase tomorrow 10. Diabetes mellitus with peripheral neuropathy. Hemoglobin A1c of 10. Home regimen of trandjenta and NovoLog 70/30 80 units at breakfast, 72 units with dinner  Lantus insulin 10 units twice a day, changed to daily on 11/10, increased to 15U on 11/15.   Novolog 4U TID, increased to 7U TID on 11/14, increased to 9U on 11/15  Check blood sugars before meals and at bedtime.   Will cont to monitor 11. Hyperlipidemia. Crestor 12. Hypothyroidism.TSH 2.954. Continue synthroid 13. Acute on chronic anemia.   Continue Aranesp, FeSO4/Vit C added 11/8  Hb 8.3 on 11/15  Will cont to monitor 14. Cervical spondylosis: Monitor 15. Persistent Leukocytosis:   WBCs 12.0 on 11/12   Cont to monitor  UA with rare bacteria, negative WBCs, Ucx, multi-species, repeat Ucx reordered 11/15  No shortness of breath. No breathing issues, no chest x-ray needed at the current time 16. Left chest discomfort  Improving  Angina +/- pleural effusion  ECG reviewed, troponin reviewed, improved 17. Sleep disturbance  Failed single therapy with trazodone  Trazodone reduced to 50 mg daily at  bedtime when necessary   Restoril 7.5 milligrams at nightime ineffective, d/ced  Ambien started 11/13, increased 11/15 18. Hypoalbuminemia   Supplement started 11/8 19. Pleural effusion  Diuresis increased by Nephro  Protein supplementation 20. Left rib fractures  Bone scan, results reviewed, conservative care 21. Left pubic rami fractures  Ortho  recommends follow-up 2 weeks in the office. No weightbearing limitations 22. Elevated light chains  Evaluated by Nephro and Heme/Onc  Heme/Onc outpt  follow up 23. Renal cysts  Urology as outpt 24. Hypokalemia  K+ 3.4 on 11/14, supplemented  LOS (Days) 9 A FACE TO FACE EVALUATION WAS PERFORMED  Santosha Jividen Lorie Phenix 12/02/2015 8:12 AM

## 2015-12-02 NOTE — Plan of Care (Signed)
Problem: RH Balance Goal: LTG Patient will maintain dynamic standing balance (PT) LTG:  Patient will maintain dynamic standing balance with assistance during mobility activities (PT)  Downgraded due to progress and participation  Problem: RH Bed to Chair Transfers Goal: LTG Patient will perform bed/chair transfers w/assist (PT) LTG: Patient will perform bed/chair transfers with assistance, with/without cues (PT).  Downgraded due to progress and participation  Problem: RH Furniture Transfers Goal: LTG Patient will perform furniture transfers w/assist (OT/PT LTG: Patient will perform furniture transfers  with assistance (OT/PT).  Downgraded due to progress and participation  Problem: RH Ambulation Goal: LTG Patient will ambulate in controlled environment (PT) LTG: Patient will ambulate in a controlled environment, # of feet with assistance (PT).  Downgraded due to progress and participation Goal: LTG Patient will ambulate in home environment (PT) LTG: Patient will ambulate in home environment, # of feet with assistance (PT).  Downgraded due to progress and participation

## 2015-12-02 NOTE — Progress Notes (Signed)
Occupational Therapy Session Note  Patient Details  Name: KAMERION MCINTEE MRN: [id number] Date of Birth: 1950-10-28  Today's Date: 12/02/2015 OT Individual Time: 1400-1410 OT Individual Time Calculation (min): 10 min  20 missed minutes secondary to pt refusal   Short Term Goals: Week 1:  OT Short Term Goal 1 (Week 1): STGs=LTGs based on short estimated LOS  Skilled Therapeutic Interventions/Progress Updates:    Upon entering the room, pt supine in bed. Pt declined session as therapist entered the room. Pt said, "I just don't think I can do this right now." OT educated pt on need for participation, goals, and discussed schedule for tomorrow in an effort to increase future participation. Call bell and all needed items within reach upon exiting the room.   Therapy Documentation Precautions:  Precautions Precautions: Fall Restrictions Weight Bearing Restrictions: No General: General OT Amount of Missed Time: 20 Minutes  See Function Navigator for Current Functional Status.   Therapy/Group: Individual Therapy  Gypsy Decant 12/02/2015, 5:02 PM

## 2015-12-02 NOTE — Progress Notes (Signed)
Occupational Therapy Session Note  Patient Details  Name: Dillon Thomas MRN: [id number] Date of Birth: [birth year]  Today's Date: 12/02/2015 OT Individual Time: [id number] Individual Time Calculation (min): 59 min     Short Term Goals: Week 1:  OT Short Term Goal 1 (Week 1): STGs=LTGs based on short estimated LOS  Skilled Therapeutic Interventions/Progress Updates:    Upon entering the room, pt supine in bed with c/o pain in buttocks. Planned session for bathing and dressing but pt refuses secondary to increased pain. Pt agreeable to bed level activities. OT educating pt on importance of participation with therapies, discussion of equipment recommendations, follow up therapies, and home set up. Pt asking several questions regarding hospital bed for home use. Pt agreeable to B UE strengthening exercises with use of orange theraputty. Pt performed 2 sets of 10 chest pulls, shoulder diagonals, and alternating punches. Pt needing multiple rest breaks. OT educated pt on use of bridging in bed to move bottom vs. Sliding as he is creating a friction on skin. Pt remains in bed at end of session with call bell and all needed items within reach upon exiting the room.   Therapy Documentation Precautions:  Precautions Precautions: Fall Restrictions Weight Bearing Restrictions: No General: General PT Missed Treatment Reason: Patient unwilling to participate;Patient fatigue  Pain: Pain Assessment Pain Score: 4   See Function Navigator for Current Functional Status.   Therapy/Group: Individual Therapy  Gypsy Decant 12/02/2015, 12:42 PM

## 2015-12-02 NOTE — Progress Notes (Signed)
Physical Therapy Note  Patient Details  Name: Dillon Thomas MRN: [id number] Date of Birth: April 22, 1950 Today's Date: 12/02/2015    Pt unwilling to participate in therapy at this time, reporting he just finished OT and is too fatigued to participate, even in bed level activities and despite education regarding importance of participation to continue progress towards long term goals. Continue per POC.    Benjiman Core Tygielski 12/02/2015, 10:20 AM

## 2015-12-03 ENCOUNTER — Inpatient Hospital Stay (HOSPITAL_COMMUNITY): Payer: MEDICARE | Admitting: Occupational Therapy

## 2015-12-03 ENCOUNTER — Inpatient Hospital Stay (HOSPITAL_COMMUNITY): Payer: MEDICARE

## 2015-12-03 DIAGNOSIS — L89152 Pressure ulcer of sacral region, stage 2: Secondary | ICD-10-CM

## 2015-12-03 LAB — GLUCOSE, CAPILLARY
GLUCOSE-CAPILLARY: 102 mg/dL — AB (ref 65–99)
Glucose-Capillary: 181 mg/dL — ABNORMAL HIGH (ref 65–99)
Glucose-Capillary: 178 mg/dL — ABNORMAL HIGH (ref 65–99)
Glucose-Capillary: 186 mg/dL — ABNORMAL HIGH (ref 65–99)

## 2015-12-03 LAB — URINE CULTURE: CULTURE: NO GROWTH

## 2015-12-03 MED ORDER — LIDOCAINE HCL 2 % EX GEL
CUTANEOUS | Status: DC | PRN
  Filled 2015-12-03: qty 5

## 2015-12-03 MED ORDER — DONEPEZIL HCL 10 MG PO TABS: 10.0000 mg | ORAL_TABLET | Freq: Every day | ORAL | 0 refills | Status: AC

## 2015-12-03 MED ORDER — HYDROCODONE-ACETAMINOPHEN 5-325 MG PO TABS: 1.0000 | ORAL_TABLET | Freq: Four times a day (QID) | ORAL | 0 refills | Status: DC | PRN

## 2015-12-03 NOTE — Progress Notes (Signed)
Physical Therapy Session Note  Patient Details  Name: Dillon Thomas MRN: [id number] Date of Birth: Aug 09, 1950  Today's Date: 12/03/2015 PT Individual Time: 0900-1000 PT Individual Time Calculation (min): 60 min    Short Term Goals: Week 1:  PT Short Term Goal 1 (Week 1): =LTG due to estimated LOS  Skilled Therapeutic Interventions/Progress Updates:    Session focused on family education with pt and pt's girlfriend, Dillon Thomas with focus on education regards to participation, endurance/activity tolerance, transfers, use of RW, gait with RW, simulated car transfer, stair negotiation, and bed mobility in regular bed. Pt completed functional mobility at overall supervision level except min assist for stairs for balance. Pt requires extra time for all tasks due to low endurance. Extensively discussed recovery and importance of building endurance, OOB, therapy participation, etc. Dillon Thomas and pt express that they do not feel that he is ready for home and prefer SNF as an option (planning to speak with CSW and notified her of their concerns). All agree that patient needs to buy in and participate no matter if he is here, SNF, or home.   Pt with episode of lightheadedness (BP 110/62mmHg; HR = 58 bpm and O2 = 99%) and nausea but able to finish the session.    Therapy Documentation Precautions:  Precautions Precautions: Fall Restrictions Weight Bearing Restrictions: No  Pain:  c/o low back pain - no intervention needed. Medication given this AM.   See Function Navigator for Current Functional Status.   Therapy/Group: Individual Therapy  Canary Brim Ivory Broad, PT, DPT  12/03/2015, 10:11 AM

## 2015-12-03 NOTE — Discharge Instructions (Signed)
Inpatient Rehab Discharge Instructions  TRAYVIN DAVIDE Discharge date and time: No discharge date for patient encounter.   Activities/Precautions/ Functional Status: Activity: activity as tolerated Diet: diabetic diet Wound Care: keep wound clean and dry Functional status:  ___ No restrictions     ___ Walk up steps independently ___ 24/7 supervision/assistance   ___ Walk up steps with assistance ___ Intermittent supervision/assistance  ___ Bathe/dress independently ___ Walk with walker     _x__ Bathe/dress with assistance ___ Walk Independently    ___ Shower independently ___ Walk with assistance    ___ Shower with assistance ___ No alcohol     ___ Return to work/school ________  Special Instructions: No driving Follow-up with Dr. Einar Gip for plan cardiac catheterization   My questions have been answered and I understand these instructions. I will adhere to these goals and the provided educational materials after my discharge from the hospital.  Patient/Caregiver Signature _______________________________ Date __________  Clinician Signature _______________________________________ Date __________  Please bring this form and your medication list with you to all your follow-up doctor's appointments.

## 2015-12-03 NOTE — Progress Notes (Signed)
Occupational Therapy Discharge Summary  Patient Details  Name: Dillon Thomas MRN: [id number] Date of Birth: 1950/09/03  Today's Date: 12/03/2015 OT Individual Time: 1761-6073 OT Individual Time Calculation (min): 55 min     Patient has met 8 of 11 long term goals due to improved balance and ability to compensate for deficits.  Patient to discharge at overall supervision - min A level.   Reasons goals not met: Pt refusal of OT intervention, safety, and decreased activity tolerance  Recommendation:  Patient will benefit from ongoing skilled OT services in skilled nursing facility setting to continue to advance functional skills in the area of BADL.  Equipment: 3 in 1   Reasons for discharge: treatment goals met   Patient/family agrees with progress made and goals achieved: Yes   OT Intervention: Upon entering the room, pt supine in bed with girlfriend present in room for family education. Pt strongly encouraged to engage in bathing and dressing has he has refused for 3 consecutive days. Pt performed supine >sit with supervision from flat bed. Pt performed sit >stand with supervision and ambulated to bathroom with use of RW. Pt engaged in shower from seated position with supervision as well for safety. Pt returning to sit on EOB for dressing with steady assistance for balance and pt needing set up A to obtain items. Pt needing multiple rest breaks and grimacing with each movement as he reports, "I am sore." OT continues to educate pt on need to lay on side in order to improve skin integrity. Caregiver crying throughout session as she learns of pt's continued refusals. Caregiver and pt requesting to speak to SW for possible discharge for SNF placement instead. Pt seated in recliner chair at end of session with all needed items within reach.  OT Discharge Precautions/Restrictions Precautions Precautions: Fall Restrictions Weight Bearing Restrictions: No  Pain Pain Assessment Pain  Assessment: 0-10 Pain Score: 5  Pain Location: Buttocks Pain Orientation: Upper ADL   Vision/Perception  Vision- History Patient Visual Report: No change from baseline  Cognition Orientation Level: Oriented X4 Sensation Sensation Light Touch: Appears Intact Stereognosis: Not tested Hot/Cold: Not tested Proprioception: Appears Intact Motor  Motor Motor - Discharge Observations: generalized weakness Mobility  Transfers Transfers: Sit to Stand;Stand to Sit Sit to Stand: 5: Supervision Stand to Sit: 5: Supervision  Trunk/Postural Assessment  Cervical Assessment Cervical Assessment: Within Functional Limits Thoracic Assessment Thoracic Assessment: Within Functional Limits Lumbar Assessment Lumbar Assessment: Within Functional Limits Postural Control Postural Control: Within Functional Limits  Balance Balance Balance Assessed: Yes Dynamic Sitting Balance Dynamic Sitting - Level of Assistance: 5: Stand by assistance Dynamic Standing Balance Dynamic Standing - Level of Assistance: 5: Stand by assistance Extremity/Trunk Assessment RUE Assessment RUE Assessment: Exceptions to [facility] (3+/5) LUE Assessment LUE Assessment: Exceptions to [facility] (3+/5)   See Function Navigator for Current Functional Status.  Gypsy Decant 12/03/2015, 7:29 PM

## 2015-12-03 NOTE — NC FL2 (Deleted)
[place] LEVEL OF CARE SCREENING TOOL     IDENTIFICATION  Patient Name: Dillon Thomas Birthdate: 1950/12/26 Sex: male Admission Date (Current Location): 11/23/2015  [facility] and [place] Number:  Herbalist and Address:  The . [facility], [place] [address] [place], [place] [address]      Provider Number: [id number]  Attending Physician Name and Address:  Ankit Lorie Phenix, MD  Relative Name and Phone Number:  Gertie Baron, significant other, [phone]    Current Level of Care: Other (Comment) (Acute Inpatient [facility]) Recommended Level of Care: [place] Prior Approval Number:    Date Approved/Denied:   PASRR Number: [id number]  Discharge Plan: SNF    Current Diagnoses: Patient Active Problem List   Diagnosis Date Noted  . Hypokalemia   . Fracture   . Elevated serum immunoglobulin free light chains   . Renovascular hypertension   . Closed fracture of multiple pubic rami, left, initial encounter ([place])   . Fluid overload   . DM type 2 with diabetic peripheral neuropathy ([place])   . Left sided chest pain   . Sleep disturbance   . Hypoalbuminemia due to protein-calorie malnutrition ([place])   . Pleural effusion   . Stage 3 chronic kidney disease   . Essential hypertension   . Poorly controlled type 2 diabetes mellitus with peripheral neuropathy ([place])   . Rib pain on left side   . Debility 11/23/2015  . Debilitated 11/23/2015  . Anemia of chronic disease   . Renal mass   . Hyperlipidemia   . Acute blood loss anemia   . Benign essential HTN   . Diabetic ketoacidosis with coma associated with type 2 diabetes mellitus ([place])   . Hemangioma   . History of TIA (transient ischemic attack)   . Hyperglycemia   . Hypothyroidism   . Lymphocytosis   . NSTEMI (non-ST elevated myocardial infarction) ([place])   . Spondylosis of cervical region without myelopathy or radiculopathy   . Uncontrolled type 2 diabetes  mellitus with peripheral neuropathy ([place])   . Skin tear of left elbow without complication   . Pressure injury of skin 11/16/2015  . Obtunded   . Right sided weakness   . Altered mental status 11/15/2015  . Acute metabolic encephalopathy [id number]  . AKI (acute kidney injury) ([place]) 11/15/2015  . CKD (chronic kidney disease) stage 3, GFR 30-59 ml/min 11/15/2015  . Anemia 11/15/2015  . Hyponatremia 11/15/2015  . Hyperosmolar (nonketotic) coma ([place]) 11/15/2015  . Elevated troponin 11/15/2015  . Tachycardia 11/15/2015  . Accelerated hypertension 11/15/2015    Orientation RESPIRATION BLADDER Height & Weight     Self, Time, Situation, Place  Normal Continent Weight: 87.5 kg (193 lb) Height:  5\' 11"  (180.3 cm)  BEHAVIORAL SYMPTOMS/MOOD NEUROLOGICAL BOWEL NUTRITION STATUS      Continent Diet (carb modified)  AMBULATORY STATUS COMMUNICATION OF NEEDS Skin   Limited Assist Verbally PU Stage and Appropriate Care (Stage II on buttocks; reddened peri-area blanchable; right and left forearm skin tears; left heal deep tissue injury; right heal open blister - Stage II on buttocks with foam dressing, lidocaine cream, repositioning, and air mattress)                       Personal Care Assistance Level of Assistance  Bathing, Dressing Bathing Assistance: Limited assistance Feeding assistance: Independent Dressing Assistance: Limited assistance     Functional Limitations Info  SPECIAL CARE FACTORS FREQUENCY  Blood pressure, PT (By licensed PT), OT (By licensed OT) Blood Pressure Frequency: 2x/week   PT Frequency: 5x/week OT Frequency: 5x/week            Contractures Contractures Info: Not present    Additional Factors Info  Code Status, Allergies (see d/c summary for scheduled insulin orders) Code Status Info: Full Allergies Info: Penicillins, Tetracyclines, and related           Current Medications (12/03/2015):  This is the current hospital active  medication list Current Facility-Administered Medications  Medication Dose Route Frequency Provider Last Rate Last Dose  . acetaminophen (TYLENOL) tablet 650 mg  650 mg Oral Q6H PRN Lavon Paganini Angiulli, PA-C      . amLODipine (NORVASC) tablet 10 mg  10 mg Oral Daily Jamal Maes, MD   10 mg at 12/03/15 0748  . aspirin EC tablet 81 mg  81 mg Oral Daily Lavon Paganini Angiulli, PA-C   81 mg at 12/03/15 0748  . bisacodyl (DULCOLAX) suppository 10 mg  10 mg Rectal Daily PRN Lavon Paganini Angiulli, PA-C      . carvedilol (COREG) tablet 25 mg  25 mg Oral BID WC Ankit Lorie Phenix, MD   25 mg at 12/03/15 0747  . Darbepoetin Alfa (ARANESP) injection 100 mcg  100 mcg Subcutaneous Q Fri-1800 Lavon Paganini Angiulli, PA-C   100 mcg at 11/27/15 1656  . donepezil (ARICEPT) tablet 10 mg  10 mg Oral QHS Charlett Blake, MD   10 mg at 12/02/15 2231  . feeding supplement (NEPRO CARB STEADY) liquid 237 mL  237 mL Oral BID BM Ivan Anchors Love, PA-C   237 mL at 11/29/15 1400  . feeding supplement (PRO-STAT SUGAR FREE 64) liquid 30 mL  30 mL Oral BID Ankit Lorie Phenix, MD   30 mL at 12/03/15 0746  . ferrous sulfate tablet 325 mg  325 mg Oral BID WC Ankit Lorie Phenix, MD   325 mg at 12/03/15 0746  . folic acid (FOLVITE) tablet 1 mg  1 mg Oral Daily Lavon Paganini Angiulli, PA-C   1 mg at 12/03/15 0747  . hydrALAZINE (APRESOLINE) tablet 50 mg  50 mg Oral Q8H Ankit Lorie Phenix, MD   50 mg at 12/03/15 0553  . HYDROcodone-acetaminophen (NORCO/VICODIN) 5-325 MG per tablet 1 tablet  1 tablet Oral Q6H PRN Cathlyn Parsons, PA-C   1 tablet at 12/03/15 0553  . insulin aspart (novoLOG) injection 0-15 Units  0-15 Units Subcutaneous TID WC Lavon Paganini Angiulli, PA-C   3 Units at 12/02/15 1725  . insulin aspart (novoLOG) injection 9 Units  9 Units Subcutaneous TID WC Ankit Lorie Phenix, MD   9 Units at 12/03/15 0755  . insulin glargine (LANTUS) injection 15 Units  15 Units Subcutaneous Daily Ankit Lorie Phenix, MD   15 Units at 12/03/15 0753  . levothyroxine  (SYNTHROID, LEVOTHROID) tablet 25 mcg  25 mcg Oral QAC breakfast Lavon Paganini Angiulli, PA-C   25 mcg at 12/03/15 0600  . lidocaine (XYLOCAINE) 2 % jelly   Other PRN Meredith Staggers, MD      . montelukast (SINGULAIR) tablet 10 mg  10 mg Oral QHS Charlett Blake, MD   10 mg at 12/02/15 2231  . ondansetron (ZOFRAN) tablet 4 mg  4 mg Oral Q6H PRN Lavon Paganini Angiulli, PA-C   4 mg at 12/01/15 1241   Or  . ondansetron (ZOFRAN) injection 4 mg  4 mg Intravenous Q6H PRN Quillian Quince  J Angiulli, PA-C      . pantoprazole (PROTONIX) EC tablet 40 mg  40 mg Oral Daily Bary Leriche, PA-C   40 mg at 12/03/15 0748  . rosuvastatin (CRESTOR) tablet 20 mg  20 mg Oral q1800 Lavon Paganini Angiulli, PA-C   20 mg at 12/02/15 1723  . sodium bicarbonate tablet 1,300 mg  1,300 mg Oral BID Rogue Bussing, MD   1,300 mg at 12/03/15 0746  . sorbitol 70 % solution 30 mL  30 mL Oral Daily PRN Lavon Paganini Angiulli, PA-C      . thiamine (VITAMIN B-1) tablet 100 mg  100 mg Oral Daily Lavon Paganini Angiulli, PA-C   100 mg at 12/03/15 0746  . ticagrelor (BRILINTA) tablet 90 mg  90 mg Oral BID Lavon Paganini Angiulli, PA-C   90 mg at 12/03/15 0746  . torsemide (DEMADEX) tablet 20 mg  20 mg Oral BID Jamal Maes, MD   20 mg at 12/03/15 0748  . traZODone (DESYREL) tablet 50 mg  50 mg Oral QHS PRN Charlett Blake, MD   50 mg at 11/30/15 2011  . vitamin C (ASCORBIC ACID) tablet 250 mg  250 mg Oral BID WC Ankit Lorie Phenix, MD   250 mg at 12/03/15 0747  . zolpidem (AMBIEN) tablet 5 mg  5 mg Oral QHS PRN Ankit Lorie Phenix, MD         Discharge Medications: Please see discharge summary for a list of discharge medications.  Relevant Imaging Results:  Relevant Lab Results:   Additional Information SSN: [id number]  Corda Shutt, Silvestre Mesi, LCSW

## 2015-12-03 NOTE — NC FL2 (Signed)
[place] LEVEL OF CARE SCREENING TOOL     IDENTIFICATION  Patient Name: Dillon Thomas Birthdate: 30-Oct-1950 Sex: male Admission Date (Current Location): 11/23/2015  [facility] and [place] Number:  Herbalist and Address:  The [place]. [facility], [place] [address] [place], [place] [address]      Provider Number: [id number]  Attending Physician Name and Address:  Alger Simons, MD  Relative Name and Phone Number:  Gertie Baron, significant other, [phone]    Current Level of Care: Other (Comment) (Acute Inpatient [facility]) Recommended Level of Care: [place] Prior Approval Number:    Date Approved/Denied:   PASRR Number: [id number]  Discharge Plan: SNF    Current Diagnoses: Patient Active Problem List   Diagnosis Date Noted  . Hypokalemia   . Fracture   . Elevated serum immunoglobulin free light chains   . Renovascular hypertension   . Closed fracture of multiple pubic rami, left, initial encounter ([place])   . Fluid overload   . DM type 2 with diabetic peripheral neuropathy ([place])   . Left sided chest pain   . Sleep disturbance   . Hypoalbuminemia due to protein-calorie malnutrition ([place])   . Pleural effusion   . Stage 3 chronic kidney disease   . Essential hypertension   . Poorly controlled type 2 diabetes mellitus with peripheral neuropathy ([place])   . Rib pain on left side   . Debility 11/23/2015  . Debilitated 11/23/2015  . Anemia of chronic disease   . Renal mass   . Hyperlipidemia   . Acute blood loss anemia   . Benign essential HTN   . Diabetic ketoacidosis with coma associated with type 2 diabetes mellitus ([place])   . Hemangioma   . History of TIA (transient ischemic attack)   . Hyperglycemia   . Hypothyroidism   . Lymphocytosis   . NSTEMI (non-ST elevated myocardial infarction) ([place])   . Spondylosis of cervical region without myelopathy or radiculopathy   . Uncontrolled type 2 diabetes  mellitus with peripheral neuropathy ([place])   . Skin tear of left elbow without complication   . Pressure injury of skin 11/16/2015  . Obtunded   . Right sided weakness   . Altered mental status 11/15/2015  . Acute metabolic encephalopathy [id number]  . AKI (acute kidney injury) ([place]) 11/15/2015  . CKD (chronic kidney disease) stage 3, GFR 30-59 ml/min 11/15/2015  . Anemia 11/15/2015  . Hyponatremia 11/15/2015  . Hyperosmolar (nonketotic) coma ([place]) 11/15/2015  . Elevated troponin 11/15/2015  . Tachycardia 11/15/2015  . Accelerated hypertension 11/15/2015    Orientation RESPIRATION BLADDER Height & Weight     Self, Time, Situation, Place  Normal Continent Weight: 87.5 kg (193 lb) Height:  5\' 11"  (180.3 cm)  BEHAVIORAL SYMPTOMS/MOOD NEUROLOGICAL BOWEL NUTRITION STATUS      Continent Diet (carb modified)  AMBULATORY STATUS COMMUNICATION OF NEEDS Skin   Limited Assist Verbally PU Stage and Appropriate Care (Stage II on buttocks; reddened peri-area blanchable; right and left forearm skin tears; left heal deep tissue injury; right heal open blister - Stage II on buttocks with foam dressing, lidocaine cream, repositioning, and air mattress)                       Personal Care Assistance Level of Assistance  Bathing, Dressing Bathing Assistance: Limited assistance Feeding assistance: Independent Dressing Assistance: Limited assistance     Functional Limitations Info  SPECIAL CARE FACTORS FREQUENCY  Blood pressure, PT (By licensed PT), OT (By licensed OT) Blood Pressure Frequency: 2x/week   PT Frequency: 5x/week OT Frequency: 5x/week            Contractures Contractures Info: Not present    Additional Factors Info  Code Status, Allergies (see d/c summary for scheduled insulin orders) Code Status Info: Full Allergies Info: Penicillins, Tetracyclines, and related           Current Medications (12/03/2015):  This is the current hospital active  medication list Current Facility-Administered Medications  Medication Dose Route Frequency Provider Last Rate Last Dose  . acetaminophen (TYLENOL) tablet 650 mg  650 mg Oral Q6H PRN Lavon Paganini Angiulli, PA-C      . amLODipine (NORVASC) tablet 10 mg  10 mg Oral Daily Jamal Maes, MD   10 mg at 12/03/15 0748  . aspirin EC tablet 81 mg  81 mg Oral Daily Lavon Paganini Angiulli, PA-C   81 mg at 12/03/15 0748  . bisacodyl (DULCOLAX) suppository 10 mg  10 mg Rectal Daily PRN Lavon Paganini Angiulli, PA-C      . carvedilol (COREG) tablet 25 mg  25 mg Oral BID WC Ankit Lorie Phenix, MD   25 mg at 12/03/15 0747  . Darbepoetin Alfa (ARANESP) injection 100 mcg  100 mcg Subcutaneous Q Fri-1800 Lavon Paganini Angiulli, PA-C   100 mcg at 11/27/15 1656  . donepezil (ARICEPT) tablet 10 mg  10 mg Oral QHS Charlett Blake, MD   10 mg at 12/02/15 2231  . feeding supplement (NEPRO CARB STEADY) liquid 237 mL  237 mL Oral BID BM Ivan Anchors Love, PA-C   237 mL at 11/29/15 1400  . feeding supplement (PRO-STAT SUGAR FREE 64) liquid 30 mL  30 mL Oral BID Ankit Lorie Phenix, MD   30 mL at 12/03/15 0746  . ferrous sulfate tablet 325 mg  325 mg Oral BID WC Ankit Lorie Phenix, MD   325 mg at 12/03/15 0746  . folic acid (FOLVITE) tablet 1 mg  1 mg Oral Daily Lavon Paganini Angiulli, PA-C   1 mg at 12/03/15 0747  . hydrALAZINE (APRESOLINE) tablet 50 mg  50 mg Oral Q8H Ankit Lorie Phenix, MD   50 mg at 12/03/15 0553  . HYDROcodone-acetaminophen (NORCO/VICODIN) 5-325 MG per tablet 1 tablet  1 tablet Oral Q6H PRN Cathlyn Parsons, PA-C   1 tablet at 12/03/15 0553  . insulin aspart (novoLOG) injection 0-15 Units  0-15 Units Subcutaneous TID WC Lavon Paganini Angiulli, PA-C   3 Units at 12/03/15 1133  . insulin aspart (novoLOG) injection 9 Units  9 Units Subcutaneous TID WC Ankit Lorie Phenix, MD   9 Units at 12/03/15 0755  . insulin glargine (LANTUS) injection 15 Units  15 Units Subcutaneous Daily Ankit Lorie Phenix, MD   15 Units at 12/03/15 0753  . levothyroxine  (SYNTHROID, LEVOTHROID) tablet 25 mcg  25 mcg Oral QAC breakfast Lavon Paganini Angiulli, PA-C   25 mcg at 12/03/15 0600  . lidocaine (XYLOCAINE) 2 % jelly   Other PRN Meredith Staggers, MD      . montelukast (SINGULAIR) tablet 10 mg  10 mg Oral QHS Charlett Blake, MD   10 mg at 12/02/15 2231  . ondansetron (ZOFRAN) tablet 4 mg  4 mg Oral Q6H PRN Lavon Paganini Angiulli, PA-C   4 mg at 12/03/15 1133   Or  . ondansetron (ZOFRAN) injection 4 mg  4 mg Intravenous Q6H PRN Quillian Quince  J Angiulli, PA-C      . pantoprazole (PROTONIX) EC tablet 40 mg  40 mg Oral Daily Bary Leriche, PA-C   40 mg at 12/03/15 0748  . rosuvastatin (CRESTOR) tablet 20 mg  20 mg Oral q1800 Lavon Paganini Angiulli, PA-C   20 mg at 12/02/15 1723  . sodium bicarbonate tablet 1,300 mg  1,300 mg Oral BID Rogue Bussing, MD   1,300 mg at 12/03/15 0746  . sorbitol 70 % solution 30 mL  30 mL Oral Daily PRN Lavon Paganini Angiulli, PA-C      . thiamine (VITAMIN B-1) tablet 100 mg  100 mg Oral Daily Lavon Paganini Angiulli, PA-C   100 mg at 12/03/15 0746  . ticagrelor (BRILINTA) tablet 90 mg  90 mg Oral BID Lavon Paganini Angiulli, PA-C   90 mg at 12/03/15 0746  . torsemide (DEMADEX) tablet 20 mg  20 mg Oral BID Jamal Maes, MD   20 mg at 12/03/15 0748  . traZODone (DESYREL) tablet 50 mg  50 mg Oral QHS PRN Charlett Blake, MD   50 mg at 11/30/15 2011  . vitamin C (ASCORBIC ACID) tablet 250 mg  250 mg Oral BID WC Ankit Lorie Phenix, MD   250 mg at 12/03/15 0747  . zolpidem (AMBIEN) tablet 5 mg  5 mg Oral QHS PRN Ankit Lorie Phenix, MD         Discharge Medications: Please see discharge summary for a list of discharge medications.  Relevant Imaging Results:  Relevant Lab Results:   Additional Information SSN: [id number]  Branon Sabine, Silvestre Mesi, LCSW

## 2015-12-03 NOTE — Progress Notes (Addendum)
  Physical Therapy Discharge Summary  Patient Details  Name: Dillon Thomas MRN: [id number] Date of Birth: 04-Jul-1950   Patient has met 8 of 9 long term goals due to improved balance and ability to compensate for deficits.  Patient to discharge at an ambulatory level Supervision to min assist with RW. Pt now had changed d/c plan to SNF. Pt and family do not feel patient is at a level where caregiver can provide needed level of care. Pt has not been participating fully in therapies and therefore has made limited progress.   Reasons goals not met: requires min assist for stair negotiation.  Recommendation:  Patient will benefit from ongoing skilled PT services in skilled nursing facility setting to continue to advance safe functional mobility, address ongoing impairments in endurance, gait, balance, activity tolerance, and minimize fall risk.  Equipment: No equipment provided. TBD at next venue of care.   Reasons for discharge: treatment goals met and discharge from hospital  Patient/family agrees with progress made and goals achieved: Yes  PT Discharge  Precautions Precautions: Fall Restrictions Weight Bearing Restrictions: No    See Function Navigator for Current Functional Status.    Canary Brim Ivory Broad, PT, DPT  12/03/2015, 12:15 PM

## 2015-12-03 NOTE — Plan of Care (Signed)
Problem: RH Stairs Goal: LTG Patient will ambulate up and down stairs w/assist (PT) LTG: Patient will ambulate up and down # of stairs with assistance (PT)  Outcome: Not Met (add Reason) Pt requires min assist

## 2015-12-03 NOTE — Progress Notes (Signed)
[place] PHYSICAL MEDICINE & REHABILITATION     PROGRESS NOTE  Subjective/Complaints:  Sitting EOB. Complaining of sacral pain---breathing well. Just got out of shower  ROS: Pt denies fever, rash/itching, headache, blurred or double vision, nausea, vomiting, abdominal pain, diarrhea, chest pain, shortness of breath, palpitations, dysuria, dizziness, neck pain, back pain, bleeding, anxiety, or depression   Objective: Vital Signs: Blood pressure (!) 153/64, pulse 73, temperature 97.6 F (36.4 C), temperature source Oral, resp. rate 18, height 5\' 11"  (1.803 m), weight 87.5 kg (193 lb), SpO2 97 %. No results found.  Recent Labs  12/02/15 0636  WBC 12.0*  HGB 8.3*  HCT 26.3*  PLT 320    Recent Labs  12/01/15 0509 12/02/15 0636  NA 137 137  K 3.4* 4.1  CL 105 104  GLUCOSE 253* 235*  BUN 75* 80*  CREATININE 3.58* 4.00*  CALCIUM 8.5* 8.4*   CBG (last 3)   Recent Labs  12/02/15 1716 12/02/15 2023 12/03/15 0617  GLUCAP 176* 143* 102*    Wt Readings from Last 3 Encounters:  12/03/15 87.5 kg (193 lb)  11/18/15 101.4 kg (223 lb 8.7 oz)    Physical Exam:  BP (!) 153/64 (BP Location: Left Arm)   Pulse 73   Temp 97.6 F (36.4 C) (Oral)   Resp 18   Ht 5\' 11"  (1.803 m)   Wt 87.5 kg (193 lb)   SpO2 97%   BMI 26.92 kg/m  Constitutional: NAD  HENT: Normocephalicand atraumatic.  Eyes: EOMI. Cardiovascular: RRR no jvd. Respiratory:no distress. Lungs clear GI: Soft. Bowel sounds are normal. He exhibits no distension.  Musculoskeletal: He exhibits edema and tenderness in left hand.  Neurological: alert   Motor: LUE: 4+/5 proximally, distally still limited by pain RUE: 4+/5 proximal to distal RLE: hip flexion 4+/5, knee extension 4+/5, ankle dorsi/plantar flexion 5/5 LLE; Hip flexion 4/5, knee extension 4+/5, ankle dorsi/plantar flexion 5/5 Skin: stage 2 sacral ulcer dime sized with sl fibronecrotic tissue. erythema surrouding area.  Right forearm skin tear  healing. Psychiatric: Mood, generally pleasant.  Assessment/Plan: 1. Functional deficits secondary to debilitation which require 3+ hours per day of interdisciplinary therapy in a comprehensive inpatient rehab setting. Physiatrist is providing close team supervision and 24 hour management of active medical problems listed below. Physiatrist and rehab team continue to assess barriers to discharge/monitor patient progress toward functional and medical goals.  Function:  Bathing Bathing position   Position: Shower  Bathing parts Body parts bathed by patient: Right arm, Left arm, Chest, Abdomen, Front perineal area, Right upper leg, Left upper leg, Right lower leg, Left lower leg, Back Body parts bathed by helper: Buttocks  Bathing assist Assist Level: Touching or steadying assistance(Pt > 75%)      Upper Body Dressing/Undressing Upper body dressing   What is the patient wearing?: Pull over shirt/dress     Pull over shirt/dress - Perfomed by patient: Thread/unthread right sleeve, Thread/unthread left sleeve, Put head through opening Pull over shirt/dress - Perfomed by helper: Put head through opening        Upper body assist Assist Level: Supervision or verbal cues      Lower Body Dressing/Undressing Lower body dressing   What is the patient wearing?: Underwear, Pants, Non-skid slipper socks, Ted Hose Underwear - Performed by patient: Thread/unthread right underwear leg, Thread/unthread left underwear leg, Pull underwear up/down   Pants- Performed by patient: Thread/unthread right pants leg, Thread/unthread left pants leg, Pull pants up/down Pants- Performed by helper: Thread/unthread right pants  leg, Thread/unthread left pants leg   Non-skid slipper socks- Performed by helper: Don/doff right sock, Don/doff left sock               TED Hose - Performed by helper: Don/doff right TED hose, Don/doff left TED hose  Lower body assist Assist for lower body dressing: Touching or  steadying assistance (Pt > 75%)      Toileting Toileting Toileting activity did not occur: No continent bowel/bladder event   Toileting steps completed by helper: Adjust clothing prior to toileting, Performs perineal hygiene, Adjust clothing after toileting Toileting Assistive Devices: Grab bar or rail  Toileting assist Assist level: Touching or steadying assistance (Pt.75%)   Transfers Chair/bed transfer   Chair/bed transfer method: Ambulatory Chair/bed transfer assist level: Touching or steadying assistance (Pt > 75%) Chair/bed transfer assistive device: Medical sales representative     Max distance: 150 Assist level: Supervision or verbal cues   Wheelchair   Type: Manual Max wheelchair distance: 150 Assist Level: Supervision or verbal cues  Cognition Comprehension Comprehension assist level: Follows complex conversation/direction with no assist  Expression Expression assist level: Expresses basic needs/ideas: With no assist  Social Interaction Social Interaction assist level: Interacts appropriately 75 - 89% of the time - Needs redirection for appropriate language or to initiate interaction.  Problem Solving Problem solving assist level: Solves basic problems with no assist  Memory Memory assist level: More than reasonable amount of time     Medical Problem List and Plan: 1.  Debilitation/acute encephalopathy secondary to DKA/NSTEMI--plan cardiac catheterization 2-4 weeks per Dr. Einar Gip  Cont CIR  -needs positive reinforcement 2.  DVT Prophylaxis/Anticoagulation: Subcutaneous heparin. Monitor platelet counts and any signs of bleeding 3. Pain Management: Hydrocodone as needed 4. Mood: Aricept 10 mg daily at bedtime 5. Neuropsych: This patient is capable of making decisions on his own behalf. 6. Skin/Wound Care: Sacral ulcer per report,  -reviewed pressure relief  -continue foam dressing  -will try prn lidocaine gel for pain relief 7. Fluids/Electrolytes/Nutrition:  Routine I&Os. Encourage po 8. CKD-3 with baseline creatinine 2.00. Renal service continued to follow with workup ongoing. Demadex initiated 11/20/1998 1740 mg twice a day.   Cr. 3.58 on 11/14, ?new baseline  nephro signed off 9. Uncontrolled hypertension.   Improved control at present  Cont Norvasc 10, Coreg 25  Hydralazine increased to 50 on 11/14 10. Diabetes mellitus with peripheral neuropathy. Hemoglobin A1c of 10. Home regimen of trandjenta and NovoLog 70/30 80 units at breakfast, 72 units with dinner  Lantus insulin 10 units twice a day, changed to daily on 11/10, increased to 15U on 11/15.   Novolog 4U TID, increased to 7U TID on 11/14, increased to [place] on 11/15  Sugars showing progress, trending down---follow today 11. Hyperlipidemia. Crestor 12. Hypothyroidism.TSH 2.954. Continue synthroid 13. Acute on chronic anemia.   Continue Aranesp, FeSO4/Vit C added 11/8  Hb 8.3 on 11/15  Follow up next week 14. Cervical spondylosis: Monitor 15. Persistent Leukocytosis:   WBCs 12.0 on 11/15  Cont to monitor  UA with rare bacteria, negative WBCs, Ucx, multi-species, repeat Ucx reordered 11/15 is negative  No shortness of breath. No breathing issues, no chest x-ray needed at the current time 16. Left chest discomfort  Improving  Angina +/- pleural effusion  Work up negative 17. Sleep disturbance  Failed single therapy with trazodone  Trazodone reduced to 50 mg daily at bedtime when necessary   Ambien 5mg  qhs prn as well 18. Hypoalbuminemia  Supplement started 11/8  -nutrition important for wound healing 19. Pleural effusion  Diuresis increased by Nephro  Protein supplementation 20. Left rib fractures  Bone scan, results reviewed, conservative care 21. Left pubic rami fractures  Ortho  recommends follow-up 2 weeks in the office. No weightbearing limitations at present 22. Elevated light chains  Evaluated by Nephro and Heme/Onc  Heme/Onc outpt follow up 23. Renal  cysts  Urology as outpt 24. Hypokalemia  K+ 3.4 on 11/14, supplemented  -diet  LOS (Days) 10 A FACE TO FACE EVALUATION WAS PERFORMED  SWARTZ,ZACHARY T 12/03/2015 8:48 AM

## 2015-12-03 NOTE — Plan of Care (Signed)
Problem: RH Dressing Goal: LTG Patient will perform lower body dressing w/assist (OT) LTG: Patient will perform lower body dressing with assist, with/without cues in positioning using equipment (OT)  Outcome: Not Met (add Reason) Supervision needed for safety  Problem: RH Toilet Transfers Goal: LTG Patient will perform toilet transfers w/assist (OT) LTG: Patient will perform toilet transfers with assist, with/without cues using equipment (OT)  Outcome: Not Met (add Reason) Steady assist needed for safety  Problem: RH Tub/Shower Transfers Goal: LTG Patient will perform tub/shower transfers w/assist (OT) LTG: Patient will perform tub/shower transfers with assist, with/without cues using equipment (OT)  Outcome: Not Met (add Reason) Steady assist needed for safety

## 2015-12-03 NOTE — Patient Care Conference (Signed)
Inpatient RehabilitationTeam Conference and Plan of Care Update Date: 12/02/2015   Time: 2:30 PM    Patient Name: Dillon Thomas      Medical Record Number: [id number]  Date of Birth: 06/09/1950 Sex: Male         Room/Bed: 4M03C/4M03C-01 Payor Info: Payor: MEDICARE RAILROAD / Plan: MEDICARE RAILROAD / Product Type: *No Product type* /    Admitting Diagnosis: Debility DKA  Admit Date/Time:  11/23/2015  6:27 PM Admission Comments: No comment available   Primary Diagnosis:  Debility Principal Problem: Debility  Patient Active Problem List   Diagnosis Date Noted  . Hypokalemia   . Fracture   . Elevated serum immunoglobulin free light chains   . Renovascular hypertension   . Closed fracture of multiple pubic rami, left, initial encounter ([place])   . Fluid overload   . DM type 2 with diabetic peripheral neuropathy ([place])   . Left sided chest pain   . Sleep disturbance   . Hypoalbuminemia due to protein-calorie malnutrition ([place])   . Pleural effusion   . Stage 3 chronic kidney disease   . Essential hypertension   . Poorly controlled type 2 diabetes mellitus with peripheral neuropathy ([place])   . Rib pain on left side   . Debility 11/23/2015  . Debilitated 11/23/2015  . Anemia of chronic disease   . Renal mass   . Hyperlipidemia   . Acute blood loss anemia   . Benign essential HTN   . Diabetic ketoacidosis with coma associated with type 2 diabetes mellitus ([place])   . Hemangioma   . History of TIA (transient ischemic attack)   . Hyperglycemia   . Hypothyroidism   . Lymphocytosis   . NSTEMI (non-ST elevated myocardial infarction) ([place])   . Spondylosis of cervical region without myelopathy or radiculopathy   . Uncontrolled type 2 diabetes mellitus with peripheral neuropathy ([place])   . Skin tear of left elbow without complication   . Pressure injury of skin 11/16/2015  . Obtunded   . Right sided weakness   . Altered mental status 11/15/2015  . Acute metabolic encephalopathy  [id number]  . AKI (acute kidney injury) ([place]) 11/15/2015  . CKD (chronic kidney disease) stage 3, GFR 30-59 ml/min 11/15/2015  . Anemia 11/15/2015  . Hyponatremia 11/15/2015  . Hyperosmolar (nonketotic) coma ([place]) 11/15/2015  . Elevated troponin 11/15/2015  . Tachycardia 11/15/2015  . Accelerated hypertension 11/15/2015    Expected Discharge Date: Expected Discharge Date: 12/04/15  Team Members Present: Physician leading conference: Dr. Delice Lesch Social Worker Present: Alfonse Alpers, LCSW Nurse Present: Heather Roberts, RN PT Present: Jorge Mandril, PT OT Present: Benay Pillow, OT SLP Present: Weston Anna, SLP PPS Coordinator present : Daiva Nakayama, RN, CRRN     Current Status/Progress Goal Weekly Team Focus  Medical   Debilitation/acute encephalopathy secondary to DKA/NSTEMI  CAD, DM, poor sleep, poor motivation  See above   Bowel/Bladder   Continent of B/B; LBM 11/30/2015  Pt will maintain current B/B status  Monitor for changes in B/B status   Swallow/Nutrition/ Hydration             ADL's   min A overall , very self limiting with therapy   goals downgraded to supervision   activity tolerance, strengthening, self care retraining, pt/family education, d/c planning   Mobility   supervision to min assist  downgraded to supervision/min assist  d/c planning, family education, endurance, balance   Communication  Safety/Cognition/ Behavioral Observations            Pain   Pt complains of general pain and pain to buttocks; [place] 5-325  less than 3  Assess for pain and treat as needed duirng shift   Skin   R and L forearm skin tears (R oozing blood); possible stage 2 to buttocks; reddened peri area-blanchable; R heel open blister; L heel deep tissue injury  Pt will remain free of new breakdown/infection of skin  Assess and monitor for changes in skin integrity and condition    Rehab Goals Patient on target to meet rehab goals: Yes Rehab Goals Revised: goals  were downgraded due to pt's lack of progress and ability to participate *See Care Plan and progress notes for long and short-term goals.  Barriers to Discharge: Mulitple medical issues, motivation, poor sleep    Possible Resolutions to Barriers:  Therapies, optimize Bp and DM meds, adjust sleep meds    Discharge Planning/Teaching Needs:  Pt and Karen,girlfriend, decided that pt is not able to go home.  They want to pursue SNF.  Santiago Glad came for family education and both she and pt agreed on SNF before pt coming home.   Team Discussion:  Per Dr. Posey Pronto, pt will need outpt f/u to still work on controlling HTN and diabetes.  He is adjusting pt's medications for better sleep.  Pt will also f/u on renal cysts.  OT and PT are having trouble getting pt to participate.  He lacks motivation and family reports the same thing was happening at home.  Pt came at min assist level and has not really made any progress - still at min assist.    Revisions to Treatment Plan:  Pt's schedule was changed to qd therapies.   Continued Need for Acute Rehabilitation Level of Care: The patient requires daily medical management by a physician with specialized training in physical medicine and rehabilitation for the following conditions: Daily direction of a multidisciplinary physical rehabilitation program to ensure safe treatment while eliciting the highest outcome that is of practical value to the patient.: Yes Daily medical management of patient stability for increased activity during participation in an intensive rehabilitation regime.: Yes Daily analysis of laboratory values and/or radiology reports with any subsequent need for medication adjustment of medical intervention for : Cardiac problems;Diabetes problems;Wound care problems;Blood pressure problems  Rishita Petron, Silvestre Mesi 12/04/2015, 8:39 AM

## 2015-12-04 LAB — GLUCOSE, CAPILLARY
Glucose-Capillary: 159 mg/dL — ABNORMAL HIGH (ref 65–99)
Glucose-Capillary: 209 mg/dL — ABNORMAL HIGH (ref 65–99)

## 2015-12-04 NOTE — Progress Notes (Signed)
[place] PHYSICAL MEDICINE & REHABILITATION     PROGRESS NOTE  Subjective/Complaints:  Slept well. Sacrum feels better. Ointment helped  ROS: Pt denies fever, rash/itching, headache, blurred or double vision, nausea, vomiting, abdominal pain, diarrhea, chest pain, shortness of breath, palpitations, dysuria, dizziness, neck pain, back pain, bleeding, anxiety, or depression   Objective: Vital Signs: Blood pressure (!) 150/53, pulse 71, temperature 97.9 F (36.6 C), temperature source Oral, resp. rate 18, height 5\' 11"  (1.803 m), weight 87.5 kg (193 lb), SpO2 96 %. No results found.  Recent Labs  12/02/15 0636  WBC 12.0*  HGB 8.3*  HCT 26.3*  PLT 320    Recent Labs  12/02/15 0636  NA 137  K 4.1  CL 104  GLUCOSE 235*  BUN 80*  CREATININE 4.00*  CALCIUM 8.4*   CBG (last 3)   Recent Labs  12/03/15 1639 12/03/15 2120 12/04/15 0627  GLUCAP 178* 186* 159*    Wt Readings from Last 3 Encounters:  12/03/15 87.5 kg (193 lb)  11/18/15 101.4 kg (223 lb 8.7 oz)    Physical Exam:  BP (!) 150/53 (BP Location: Right Arm)   Pulse 71   Temp 97.9 F (36.6 C) (Oral)   Resp 18   Ht 5\' 11"  (1.803 m)   Wt 87.5 kg (193 lb)   SpO2 96%   BMI 26.92 kg/m  Constitutional: NAD. Appears comfortable HENT: Normocephalicand atraumatic.  Eyes: EOMI. Cardiovascular: RRR Respiratory CTA B GI: Soft. NT Musculoskeletal: He exhibits edema and tenderness in left hand.  Neurological: alert   Motor: LUE: 4+to 5/5 proximally RUE: 4+/5 proximal to distal RLE: hip flexion 4+/5, knee extension 4+/5, ankle dorsi/plantar flexion 5/5 LLE; Hip flexion 4/5, knee extension 4+/5, ankle dorsi/plantar flexion 5/5 Skin: stage 2 sacral ulcer dime sized with sl fibronecrotic tissue. erythema surrounding area.  Right forearm skin tear healing. Psychiatric: pleasant  Assessment/Plan: 1. Functional deficits secondary to debilitation which require 3+ hours per day of interdisciplinary therapy in a  comprehensive inpatient rehab setting. Physiatrist is providing close team supervision and 24 hour management of active medical problems listed below. Physiatrist and rehab team continue to assess barriers to discharge/monitor patient progress toward functional and medical goals.  Function:  Bathing Bathing position   Position: Shower  Bathing parts Body parts bathed by patient: Right arm, Left arm, Chest, Abdomen, Front perineal area, Right upper leg, Left upper leg, Right lower leg, Left lower leg, Back, Buttocks Body parts bathed by helper: Buttocks  Bathing assist Assist Level: Supervision or verbal cues      Upper Body Dressing/Undressing Upper body dressing   What is the patient wearing?: Pull over shirt/dress     Pull over shirt/dress - Perfomed by patient: Thread/unthread right sleeve, Thread/unthread left sleeve, Put head through opening, Pull shirt over trunk Pull over shirt/dress - Perfomed by helper: Put head through opening        Upper body assist Assist Level: Supervision or verbal cues      Lower Body Dressing/Undressing Lower body dressing   What is the patient wearing?: Pants Underwear - Performed by patient: Thread/unthread right underwear leg, Thread/unthread left underwear leg, Pull underwear up/down   Pants- Performed by patient: Thread/unthread right pants leg, Thread/unthread left pants leg, Pull pants up/down Pants- Performed by helper: Thread/unthread right pants leg, Thread/unthread left pants leg   Non-skid slipper socks- Performed by helper: Don/doff right sock, Don/doff left sock  TED Hose - Performed by helper: Don/doff right TED hose, Don/doff left TED hose  Lower body assist Assist for lower body dressing: Touching or steadying assistance (Pt > 75%)      Toileting Toileting Toileting activity did not occur: No continent bowel/bladder event (using urinal, no BM)   Toileting steps completed by helper: Adjust clothing prior  to toileting, Performs perineal hygiene, Adjust clothing after toileting Toileting Assistive Devices: Grab bar or rail  Toileting assist Assist level: Touching or steadying assistance (Pt.75%)   Transfers Chair/bed transfer   Chair/bed transfer method: Ambulatory, Stand pivot Chair/bed transfer assist level: Supervision or verbal cues Chair/bed transfer assistive device: Walker, Air cabin crew     Max distance: 150 Assist level: Supervision or verbal cues   Wheelchair   Type: Manual Max wheelchair distance: 150 Assist Level: Supervision or verbal cues  Cognition Comprehension Comprehension assist level: Understands basic 90% of the time/cues < 10% of the time  Expression Expression assist level: Expresses basic needs/ideas: With no assist  Social Interaction Social Interaction assist level: Interacts appropriately 75 - 89% of the time - Needs redirection for appropriate language or to initiate interaction.  Problem Solving Problem solving assist level: Solves basic 75 - 89% of the time/requires cueing 10 - 24% of the time  Memory Memory assist level: Recognizes or recalls 90% of the time/requires cueing < 10% of the time     Medical Problem List and Plan: 1.  Debilitation/acute encephalopathy secondary to DKA/NSTEMI--plan cardiac catheterization 2-4 weeks per Dr. Einar Gip  Cont CIR  -now seeking SNF 2.  DVT Prophylaxis/Anticoagulation: Subcutaneous heparin. Monitor platelet counts and any signs of bleeding 3. Pain Management: Hydrocodone prn 4. Mood: Aricept 10 mg daily at bedtime 5. Neuropsych: This patient is capable of making decisions on his own behalf. 6. Skin/Wound Care: Sacral ulcer per report  -reviewed pressure relief  -continue foam dressing  -  prn lidocaine gel for pain relief has been helpful 7. Fluids/Electrolytes/Nutrition: encourage PO 8. CKD-3 with baseline creatinine 2.00. Renal service continued to follow with workup ongoing. Demadex  initiated 11/20/1998 1740 mg twice a day.   Cr. 4 on 11/15, ?new baseline  nephro signed off  -recheck Monday 9. Uncontrolled hypertension.   Improved control at present  Cont Norvasc 10, Coreg 25  Hydralazine increased to 50 on 11/14 10. Diabetes mellitus with peripheral neuropathy. Hemoglobin A1c of 10. Home regimen of trandjenta and NovoLog 70/30 80 units at breakfast, 72 units with dinner  Lantus insulin 10 units twice a day, changed to daily on 11/10, increased to 15U on 11/15.   Novolog 4U TID, increased to 7U TID on 11/14, increased to 9U on 11/15  Reasonable control at present--continue current regimen 11. Hyperlipidemia. Crestor 12. Hypothyroidism.TSH 2.954. Continue synthroid 13. Acute on chronic anemia.   Continue Aranesp, FeSO4/Vit C added 11/8  Hb 8.3 on 11/15  Follow up Monday 14. Cervical spondylosis: Monitor 15. Persistent Leukocytosis:   WBCs 12.0 on 11/15  Cont to monitor  UCX negative   No shortness of breath. No breathing issues, no chest x-ray needed at the current time 16. Left chest discomfort  Improving  Angina +/- pleural effusion  Work up negative 17. Sleep disturbance  Failed single therapy with trazodone  Trazodone reduced to 50 mg daily at bedtime when necessary   Ambien 5mg  qhs prn as well 18. Hypoalbuminemia   Supplement started 11/8  -nutrition important for wound healing 19. Pleural effusion  Diuretic increased by  Nephro  Protein supplementation 20. Left rib fractures  Bone scan, results reviewed, conservative care 21. Left pubic rami fractures  Ortho  recommends follow-up 2 weeks in the office. No weightbearing limitations at present 22. Elevated light chains  Evaluated by Nephro and Heme/Onc  Heme/Onc outpt follow up 23. Renal cysts  Urology as outpt 24. Hypokalemia  K+ 3.4 on 11/14, supplemented  -diet intake  -recheck Monday if here  LOS (Days) 11 A FACE TO FACE EVALUATION WAS PERFORMED  Dillon Thomas T 12/04/2015 8:29 AM

## 2015-12-04 NOTE — Progress Notes (Signed)
Social Work Patient ID: Dillon Thomas, male   DOB: Oct 08, 1950, 65 y.o.   MRN: [id number]   CSW met with pt and his significant other, Dillon Thomas, 12-03-15 to update them on team conference discussion and they do not feel pt can safely be managed at home at this time.  They requested that CSW look into SNF transfer and CSW did find pt a bed at [facility], which is closer to their homes.  Dillon Thomas plans to transport pt there on Friday once SNF gives [place] a transfer time.  Pt and Dillon Thomas were appreciative of plan and care received on CIR.

## 2015-12-04 NOTE — Progress Notes (Signed)
Social Work Discharge Note  The overall goal for the admission was met for:   Discharge location: No - pt ended up going to SNF  Length of Stay: Yes - 11 days  Discharge activity level: No - supervision to minimal assistance  Home/community participation: No - pt transferred to [place] provided included: MD, RD, PT, OT, RN, Pharmacy, [place]: Medicare and Private Insurance: [employer] and [place] secondary  Follow-up services arranged: Other: [place] and Rehab  Comments (or additional information): Pt and significant other, Dillon Thomas, did not feel comfortable with pt returning home at his current level of function.  Pt was not able to continue at 3 hours of therapy per day, so decision was made to transfer to SNF for further rehabilitation prior to returning home to [place] care.  Patient/Family verbalized understanding of follow-up arrangements: Yes  Individual responsible for coordination of the follow-up plan: pt and his significant other, Dillon Thomas, with help of [place] transition team  Confirmed correct DME delivered: Trey Sailors 12/04/2015    Savannah Erbe, Silvestre Mesi

## 2015-12-04 NOTE — Progress Notes (Signed)
RUE continues to drain. Woke disoriented once during night. Dillon Thomas A

## 2015-12-04 NOTE — Progress Notes (Signed)
Social Work  Discharge Note  The overall goal for the admission was met for:   Discharge location: No-[facility]  Length of Stay: Yes-11 DAYS  Discharge activity level: Yes-SUPERVISION-MIN ASSIST LEVEL  Home/community participation: Yes  Services provided included: MD, RD, PT, OT, RN, CM, TR, Pharmacy and SW  Financial Services: Medicare and Private Insurance: [place]  Follow-up services arranged: Other: SHORT TERM NHP  Comments (or additional information):BOTH DECIDED PT NEEDED MORE REHAB BEFORE GOING HOME-PLAN SHORT TERM NHP. PT NEEDS TO BE PUSHED TO PARTICIPATE IN THERAPIES. GIRLFRIEND TO TRANSPORT TO FACILITY.  Patient/Family verbalized understanding of follow-up arrangements: Yes  Individual responsible for coordination of the follow-up plan: SELF & KAREN-GIRLFRIEND  Confirmed correct DME delivered: Elease Hashimoto 12/04/2015    Elease Hashimoto

## 2015-12-04 NOTE — Progress Notes (Signed)
Pt discharged to SNF with family. Report called to [facility] and family given discharge paperwork.

## 2015-12-04 NOTE — Progress Notes (Signed)
Social Work Patient ID: Dillon Thomas, male   DOB: [birth year], 65 y.o.   MRN: [id number]   Dillon Child, LCSW Social Worker Signed   Patient Care Conference Date of Service: 12/03/2015  3:21 PM      Hide copied text Hover for attribution information Inpatient RehabilitationTeam Conference and Plan of Care Update Date: 12/02/2015   Time: 2:30 PM      Patient Name: Dillon Thomas      Medical Record Number: [id number]  Date of Birth: [birth year] Sex: Male         Room/Bed: 4M03C/4M03C-01 Payor Info: Payor: MEDICARE RAILROAD / Plan: MEDICARE RAILROAD / Product Type: *No Product type* /     Admitting Diagnosis: Debility DKA  Admit Date/Time:  11/23/2015  6:27 PM Admission Comments: No comment available    Primary Diagnosis:  Debility Principal Problem: Debility       Patient Active Problem List    Diagnosis Date Noted  . Hypokalemia    . Fracture    . Elevated serum immunoglobulin free light chains    . Renovascular hypertension    . Closed fracture of multiple pubic rami, left, initial encounter ([place])    . Fluid overload    . DM type 2 with diabetic peripheral neuropathy ([place])    . Left sided chest pain    . Sleep disturbance    . Hypoalbuminemia due to protein-calorie malnutrition ([place])    . Pleural effusion    . Stage 3 chronic kidney disease    . Essential hypertension    . Poorly controlled type 2 diabetes mellitus with peripheral neuropathy ([place])    . Rib pain on left side    . Debility 11/23/2015  . Debilitated 11/23/2015  . Anemia of chronic disease    . Renal mass    . Hyperlipidemia    . Acute blood loss anemia    . Benign essential HTN    . Diabetic ketoacidosis with coma associated with type 2 diabetes mellitus ([place])    . Hemangioma    . History of TIA (transient ischemic attack)    . Hyperglycemia    . Hypothyroidism    . Lymphocytosis    . NSTEMI (non-ST elevated myocardial infarction) ([place])    . Spondylosis of cervical region without myelopathy or  radiculopathy    . Uncontrolled type 2 diabetes mellitus with peripheral neuropathy ([place])    . Skin tear of left elbow without complication    . Pressure injury of skin 11/16/2015  . Obtunded    . Right sided weakness    . Altered mental status 11/15/2015  . Acute metabolic encephalopathy [id number]  . AKI (acute kidney injury) ([place]) 11/15/2015  . CKD (chronic kidney disease) stage 3, GFR 30-59 ml/min 11/15/2015  . Anemia 11/15/2015  . Hyponatremia 11/15/2015  . Hyperosmolar (nonketotic) coma ([place]) 11/15/2015  . Elevated troponin 11/15/2015  . Tachycardia 11/15/2015  . Accelerated hypertension 11/15/2015      Expected Discharge Date: Expected Discharge Date: 12/04/15   Team Members Present: Physician leading conference: Dr. Delice Lesch Social Worker Present: Dillon Alpers, LCSW Nurse Present: Dillon Roberts, RN PT Present: Dillon Thomas, PT OT Present: Dillon Thomas, OT SLP Present: Dillon Thomas, SLP PPS Coordinator present : Dillon Nakayama, RN, CRRN       Current Status/Progress Goal Weekly Team Focus  Medical   Debilitation/acute encephalopathy secondary to DKA/NSTEMI  CAD, DM, poor sleep, poor motivation  See above   Bowel/Bladder   Continent of  B/B; LBM 11/30/2015  Pt will maintain current B/B status  Monitor for changes in B/B status   Swallow/Nutrition/ Hydration             ADL's   min A overall , very self limiting with therapy   goals downgraded to supervision   activity tolerance, strengthening, self care retraining, pt/family education, d/c planning   Mobility   supervision to min assist  downgraded to supervision/min assist  d/c planning, family education, endurance, balance   Communication             Safety/Cognition/ Behavioral Observations           Pain   Pt complains of general pain and pain to buttocks; Norco 5-325  less than 3  Assess for pain and treat as needed duirng shift   Skin   R and L forearm skin tears (R oozing blood); possible stage 2 to  buttocks; reddened peri area-blanchable; R heel open blister; L heel deep tissue injury  Pt will remain free of new breakdown/infection of skin  Assess and monitor for changes in skin integrity and condition     Rehab Goals Patient on target to meet rehab goals: Yes Rehab Goals Revised: goals were downgraded due to pt's lack of progress and ability to participate *See Care Plan and progress notes for long and short-term goals.   Barriers to Discharge: Mulitple medical issues, motivation, poor sleep   Possible Resolutions to Barriers:  Therapies, optimize Bp and DM meds, adjust sleep meds   Discharge Planning/Teaching Needs:  Pt and Karen,girlfriend, decided that pt is not able to go home.  They want to pursue SNF.  Santiago Glad came for family education and both she and pt agreed on SNF before pt coming home.   Team Discussion:  Per Dr. Posey Pronto, pt will need outpt f/u to still work on controlling HTN and diabetes.  He is adjusting pt's medications for better sleep.  Pt will also f/u on renal cysts.  OT and PT are having trouble getting pt to participate.  He lacks motivation and family reports the same thing was happening at home.  Pt came at min assist level and has not really made any progress - still at min assist.    Revisions to Treatment Plan:  Pt's schedule was changed to qd therapies.    Continued Need for Acute Rehabilitation Level of Care: The patient requires daily medical management by a physician with specialized training in physical medicine and rehabilitation for the following conditions: Daily direction of a multidisciplinary physical rehabilitation program to ensure safe treatment while eliciting the highest outcome that is of practical value to the patient.: Yes Daily medical management of patient stability for increased activity during participation in an intensive rehabilitation regime.: Yes Daily analysis of laboratory values and/or radiology reports with any subsequent need for  medication adjustment of medical intervention for : Cardiac problems;Diabetes problems;Wound care problems;Blood pressure problems   Dillon Thomas, Silvestre Mesi 12/04/2015, 8:39 AM

## 2015-12-04 NOTE — Discharge Summary (Signed)
Dillon Thomas, Dillon Thomas                ACCOUNT NO.:  [id number]  MEDICAL RECORD NO.:  [id number]  LOCATION:  4M03C                        FACILITY:  [facility]  PHYSICIAN:  Delice Lesch, MD        DATE OF BIRTH:  30-Aug-1950  DATE OF ADMISSION:  11/23/2015 DATE OF DISCHARGE:  12/04/2015                              DISCHARGE SUMMARY   DISCHARGE DIAGNOSES: 1. Debilitation, acute encephalopathy secondary to diabetic     ketoacidosis - non-ST-segment elevation myocardial infarction. 2. Subcutaneous heparin for deep venous thrombosis prophylaxis. 3. Pain management. 4. Baseline dementia. 5. Chronic kidney disease, stage III. 6. Uncontrolled hypertension. 7. Diabetes mellitus with peripheral neuropathy. 8. Hyperlipidemia. 9. Hypothyroidism. 10.Acute-on-chronic anemia. 11.Cervical spondylosis. 12.Persistent leukocytosis. 13.Left pubic rami fractures. 14.Hypokalemia, resolved.  HISTORY OF PRESENT ILLNESS:  This is a 64 year old right-handed male, with history of uncontrolled type 2 diabetes mellitus, peripheral neuropathy, CKD stage III, TIA.  Presented on November 15, 2015, with progressive shuffling gait.  Per chart review, lives alone, independent prior to admission, still driving short distances.  He was found down by significant other and could not speak.  Findings of elevated glucose greater than 700, hemoglobin A1c of 10.  MRI of the brain showed no evidence of acute infarct.  MRI of cervical spine with spondylosis, some canal narrowing from C4-5 through C7-T1.  The patient received 2 L of normal saline, started on insulin infusion.  Hypertensive, systolic pressure [id number] as well as tachycardia.  Troponin 7.46.  Creatinine on admission 3.30 from baseline 2.00.  Renal ultrasound showing no obstruction.  Nephrology consulted for followup, suspect prerenal etiology with hypoperfusion from hyperosmolar diuresis and perhaps ACE inhibitor.  There was a cystic mass arising from the upper pole  of the right kidney, measuring 2.5 x 2.8 x 2.2 cm.  MRI suggested, but could not be completed with contrast due to renal insufficiency.  An MR of abdomen without contrast completed, showing no identified renal mass. There was some noted renal cyst and recommendations for reimaging in 3-6 months.  Echocardiogram with ejection fraction of 55%, no wall motion abnormalities.  EEG showed mild diffuse slowing of the electrocerebral activity, no seizure activity.  Cardiology Services consulted.  Elevated troponin, suspect NSTEMI with Brilinta added to regimen.  Plan coronary angiography when the patient medically stable and metabolic issues resolved in the next 2-4 weeks.  Modified barium swallow, November 19, 2015, maintained on a regular diet.  Physical and occupational therapy ongoing.  The patient was admitted for a comprehensive rehab program.  PAST MEDICAL HISTORY:  See discharge diagnoses.  SOCIAL HISTORY:  Lives alone.  Reported to be independent prior to admission.  FUNCTIONAL STATUS:  Upon admission to [facility], minimal assist 50 feet 4-wheeled rolling walker; moderate assist, sit to stand; min-to-mod assist, activities of daily living.  PHYSICAL EXAMINATION:  VITAL SIGNS:  Blood pressure 164/68, pulse 92, temperature 97, respirations 18. GENERAL:  This was an alert male, no acute distress.  He provides name and age. LUNGS:  Clear to auscultation without wheeze. CARDIAC:  Regular rhythm and murmur. ABDOMEN:  Soft, nontender.  Good bowel sounds. NEUROLOGIC:  Sensation intact to light touch.  Grossly graded strength  4- to 5/5.  REHABILITATION HOSPITAL COURSE:  The patient was admitted to Inpatient Rehab Services with therapies initiated on a 3-hour daily basis, consisting of physical therapy, occupational therapy and rehabilitation nursing.  The following issues were addressed during the patient's rehabilitation stay.  Pertaining to Mr. Hinderliter's debilitation,  acute encephalopathy secondary to DKA with NSTEMI, he would follow up with Cardiology Services for planned cardiac catheterization in 2-4 weeks. Subcutaneous heparin for DVT prophylaxis.  No bleeding episodes.  Pain management with the use of hydrocodone as needed.  He remained on Aricept for some baseline dementia.  Chronic kidney disease, stage III, baseline 2.00.  Renal Service followup.  Demadex initiated.  Blood pressures controlled and monitored.  He would follow up with his primary MD.  Diabetes mellitus, peripheral neuropathy.  Hemoglobin A1c of 10. The patient on insulin therapy as directed with full diabetic teaching. He continued on Crestor for hyperlipidemia.  Thyroid supplement for hypothyroidism with TSH 2.954.  Acute-on-chronic anemia, 8.3, iron supplement.  Persistent leukocytosis of 12, urine study negative. Chemistries unremarkable.  Chest x-ray, negative, findings of left pubic rami fracture.  Orthopedic Service followup, weightbearing as tolerated. Conservative care.  He would see Dr. Erlinda Hong in the outpatient office in 2 weeks.  Bouts of hypokalemia resolved with potassium supplement.  The patient received weekly collaborative interdisciplinary team conferences to discuss estimated length of stay, family teaching, any barriers to his discharge.  Sit-to-stand with supervision rolling walker, supervision for standing balance, ambulating 95 feet x2 supervision with slow speed, rest breaks as required with energy conservation techniques. He could gather his belongings for activities of daily living and homemaking anticipation was made for discharge to home however family did not feel they could provide the necessary assistance and skilled nursing facility was requested. Bed became available 12/04/2015 and discharged taking place  DISCHARGE MEDICATIONS:  Included: 1. Norvasc 10 mg p.o. daily. 2. Aspirin 81 mg p.o. daily. 3. Coreg 25 mg p.o. b.i.d. 4. Aricept 10 mg p.o. at  bedtime. 5. Ferrous sulfate 325 mg p.o. b.i.d. 6. Folic acid 1 mg p.o. daily. 7. Hydralazine 50 mg p.o. every 8 hours. 8. Insulin NovoLog 9 units t.i.d. and Lantus insulin 15 units daily. 9. Synthroid 25 mcg p.o. daily. 10.Singulair 10 mg p.o. at bedtime. 11.Protonix 40 mg p.o. daily. 12.Crestor 20 mg p.o. daily. 13.Sodium bicarbonate 1300 mg p.o. b.i.d. 14.Brilinta 90 mg p.o. b.i.d. 15.Demadex 20 mg p.o. b.i.d. 16.Vitamin C 250 mg p.o. b.i.d. 17.Hydrocodone 1 tablet p.o. every 6 hours as needed pain, dispense of     20 tablets.  DIET:  His diet was a diabetic diet.  FOLLOWUP:  The patient would follow up with Dr. Kela Millin, for planned cardiac catheterization; Dr. Eduard Roux, 2 weeks; Dr. Delice Lesch, as advised; Dr. Alvy Bimler, call for appointment; Dr. Corliss Parish, Renal Services, call for appointment; Dr. Kym Groom, medical Management.  SPECIAL INSTRUCTIONS:  No driving.  No alcohol.  No smoking.     Lauraine Rinne, P.A.   ______________________________ Delice Lesch, MD    DA/MEDQ  D:  12/03/2015  T:  12/04/2015  Job:  [id number]  cc:   Dr. Alvy Bimler Dr. Lisette Grinder, M.D. Laverda Page, MD Louis Meckel, M.D.

## 2015-12-07 ENCOUNTER — Non-Acute Institutional Stay (SKILLED_NURSING_FACILITY): Payer: MEDICARE | Admitting: Internal Medicine

## 2015-12-07 ENCOUNTER — Encounter: Payer: Self-pay | Admitting: Internal Medicine

## 2015-12-07 DIAGNOSIS — R2681 Unsteadiness on feet: Secondary | ICD-10-CM | POA: Diagnosis not present

## 2015-12-07 DIAGNOSIS — R3981 Functional urinary incontinence: Secondary | ICD-10-CM

## 2015-12-07 DIAGNOSIS — I214 Non-ST elevation (NSTEMI) myocardial infarction: Secondary | ICD-10-CM

## 2015-12-07 DIAGNOSIS — L89152 Pressure ulcer of sacral region, stage 2: Secondary | ICD-10-CM

## 2015-12-07 DIAGNOSIS — N183 Chronic kidney disease, stage 3 unspecified: Secondary | ICD-10-CM

## 2015-12-07 DIAGNOSIS — E1165 Type 2 diabetes mellitus with hyperglycemia: Secondary | ICD-10-CM

## 2015-12-07 DIAGNOSIS — M4712 Other spondylosis with myelopathy, cervical region: Secondary | ICD-10-CM | POA: Diagnosis not present

## 2015-12-07 DIAGNOSIS — R768 Other specified abnormal immunological findings in serum: Secondary | ICD-10-CM

## 2015-12-07 DIAGNOSIS — E1140 Type 2 diabetes mellitus with diabetic neuropathy, unspecified: Secondary | ICD-10-CM | POA: Diagnosis not present

## 2015-12-07 DIAGNOSIS — D72828 Other elevated white blood cell count: Secondary | ICD-10-CM | POA: Diagnosis not present

## 2015-12-07 DIAGNOSIS — D638 Anemia in other chronic diseases classified elsewhere: Secondary | ICD-10-CM

## 2015-12-07 DIAGNOSIS — R531 Weakness: Secondary | ICD-10-CM

## 2015-12-07 DIAGNOSIS — I10 Essential (primary) hypertension: Secondary | ICD-10-CM | POA: Diagnosis not present

## 2015-12-07 DIAGNOSIS — IMO0002 Reserved for concepts with insufficient information to code with codable children: Secondary | ICD-10-CM

## 2015-12-07 DIAGNOSIS — F0390 Unspecified dementia without behavioral disturbance: Secondary | ICD-10-CM

## 2015-12-07 DIAGNOSIS — S2243XS Multiple fractures of ribs, bilateral, sequela: Secondary | ICD-10-CM

## 2015-12-07 DIAGNOSIS — N281 Cyst of kidney, acquired: Secondary | ICD-10-CM | POA: Diagnosis not present

## 2015-12-07 DIAGNOSIS — S32592S Other specified fracture of left pubis, sequela: Secondary | ICD-10-CM

## 2015-12-07 DIAGNOSIS — E039 Hypothyroidism, unspecified: Secondary | ICD-10-CM

## 2015-12-07 DIAGNOSIS — E440 Moderate protein-calorie malnutrition: Secondary | ICD-10-CM | POA: Diagnosis not present

## 2015-12-07 DIAGNOSIS — D329 Benign neoplasm of meninges, unspecified: Secondary | ICD-10-CM

## 2015-12-07 DIAGNOSIS — K219 Gastro-esophageal reflux disease without esophagitis: Secondary | ICD-10-CM

## 2015-12-07 NOTE — Progress Notes (Signed)
LOCATION: [place]  PCP: [name], MD   Code Status: Full Code  Goals of care: Advanced Directive information Advanced Directives 11/23/2015  Does patient have an advance directive? Yes  Type of Advance Directive [place]  Does patient want to make changes to advanced directive? -  Copy of advanced directive(s) in chart? -       Extended Emergency Contact Information Primary Emergency Contact: [employer] [facility] Address: [address]          [place], [place] [address] [place] Phone: [phone] Mobile Phone: [phone] Relation: Significant other   Allergies  Allergen Reactions  . Penicillins Other (See Comments)    Unknown  . Tetracyclines & Related Other (See Comments)    Unknown    Chief Complaint  Patient presents with  . New Admit To SNF    New Admission Visit     HPI:  Patient is a 65 y.o. male seen today for short term rehabilitation post hospital admission from 11/15/15-11/23/15 and inpatient rehabilitation from 11/23/15-12/04/15. He was taken to the ED obtunded and with urinary incontinence. He was noted to have Diabetic ketoacidosis and acute on chronic renal failure. Acute CVA and seizure were ruled out. There was concern for meningioma and neurosurgery was consulted. Obstructive renal etiology was ruled out. He was seen by nephrology, IR and urology for right kidney cystic lesion. He received 1 u prbc in hospital for anemia. MRI c-spine showed cervical spondylosis with canal narrowing c4-5 through c7-t1.He has PMH of DM type 2, CKD stage 3, peripheral neuropathy, gerd, prior TIA among others. He is seen in his room today with his girlfriend and daughter in law present.   Review of Systems:  Constitutional: Negative for fever, chills, diaphoresis. Energy level is slowly returning.  HENT: Negative for headache, congestion, nasal discharge, difficulty swallowing.   Eyes: Negative for blurred  vision, double vision and discharge. he wears corrective glasses.  Respiratory: Negative for shortness of breath and wheezing. Positive for cough with yellow phlegm.  Cardiovascular: Negative for chest pain, palpitations, leg swelling.  Gastrointestinal: Negative for nausea, vomiting, abdominal pain. Positive for occasional heartburn. Last bowel movement was this morning with black colored stool.  Genitourinary: Negative for dysuria. Positive for urinary incontinence.   Musculoskeletal: Negative for fall in the facility. positive for back pain and extremity weakness mainly in left leg.  Skin: Negative for itching, rash.  Neurological: Negative for dizziness. He has history of neuropathy.  Psychiatric/Behavioral: Negative for depression   Past Medical History:  Diagnosis Date  . BPH (benign prostatic hyperplasia)   . Chronic kidney disease   . Depression   . Diabetes mellitus without complication ([place])   . GERD (gastroesophageal reflux disease)   . Hyperlipidemia   . Hypertension   . Hypothyroidism   . TIA (transient ischemic attack)    Past Surgical History:  Procedure Laterality Date  . APPENDECTOMY    . CHOLECYSTECTOMY    . CYSTOURETHROSCOPY    . POLYPECTOMY     Vocal cord polyp removed  . VASECTOMY     Social History:   reports that he quit smoking about 3 years ago. He has never used smokeless tobacco. He reports that he does not drink alcohol or use drugs.  Family History  Problem Relation Age of Onset  . CAD Mother   . Diabetes Mother   . Hyperlipidemia Mother   . Hypertension Mother   . CVA Mother   .  Alcohol abuse Father   . Diabetes Father   . Hyperlipidemia Father     Medications:   Medication List       Accurate as of 12/07/15  2:01 PM. Always use your most recent med list.          amLODipine 10 MG tablet Commonly known as:  NORVASC Take 10 mg by mouth daily.   aspirin 81 MG EC tablet Take 1 tablet (81 mg total) by mouth daily.     atorvastatin 80 MG tablet Commonly known as:  LIPITOR Take 80 mg by mouth daily.   carvedilol 25 MG tablet Commonly known as:  COREG Take 25 mg by mouth 2 (two) times daily with a meal.   donepezil 10 MG tablet Commonly known as:  ARICEPT Take 1 tablet (10 mg total) by mouth at bedtime.   ferrous sulfate 325 (65 FE) MG tablet Take 325 mg by mouth 2 (two) times daily.   folic acid 1 MG tablet Commonly known as:  FOLVITE Take 1 tablet (1 mg total) by mouth daily.   hydrALAZINE 50 MG tablet Commonly known as:  APRESOLINE Take 50 mg by mouth every 8 (eight) hours.   HYDROcodone-acetaminophen 5-325 MG tablet Commonly known as:  NORCO/VICODIN Take 1 tablet by mouth every 6 (six) hours as needed for moderate pain.   insulin aspart 100 UNIT/ML injection Commonly known as:  novoLOG Inject 0-9 Units into the skin 3 (three) times daily with meals.   insulin glargine 100 UNIT/ML injection Commonly known as:  LANTUS Inject 15 Units into the skin daily.   levothyroxine 25 MCG tablet Commonly known as:  SYNTHROID, LEVOTHROID Take 1 tablet (25 mcg total) by mouth daily before breakfast.   montelukast 10 MG tablet Commonly known as:  SINGULAIR Take 10 mg by mouth at bedtime.   pantoprazole 40 MG tablet Commonly known as:  PROTONIX Take 1 tablet (40 mg total) by mouth daily.   sodium bicarbonate 650 MG tablet Take 1,300 mg by mouth 2 (two) times daily.   ticagrelor 90 MG Tabs tablet Commonly known as:  BRILINTA Take 1 tablet (90 mg total) by mouth 2 (two) times daily.   torsemide 20 MG tablet Commonly known as:  DEMADEX Take 20 mg by mouth 2 (two) times daily.   vitamin C 250 MG tablet Commonly known as:  ASCORBIC ACID Take 250 mg by mouth 2 (two) times daily.       Immunizations: Immunization History  Administered Date(s) Administered  . Influenza,inj,Quad PF,36+ Mos 11/26/2015  . PPD Test 12/04/2015     Physical Exam: Vitals:   12/07/15 1223  BP: (!)  159/78  Pulse: 64  Resp: 16  Temp: 98.1 F (36.7 C)  TempSrc: Oral  SpO2: 95%  Weight: 193 lb (87.5 kg)  Height: 5' 11"  (1.803 m)   Body mass index is 26.92 kg/m.  General- elderly male, well built, in no acute distress Head- normocephalic, atraumatic Nose- no maxillary or frontal sinus tenderness, no nasal discharge Throat- moist mucus membrane Eyes- PERRLA, EOMI, no pallor, no icterus Neck- no cervical lymphadenopathy Cardiovascular- normal s1,s2, no murmur, no leg edema Respiratory- bilateral clear to auscultation, no wheeze, no rhonchi, no crackles, no use of accessory muscles Abdomen- bowel sounds present, soft, non tender, no guarding or rigidity, no CVA tenderness Musculoskeletal- able to move all 4 extremities, generalized weakness most prominent to LLE with strength 4/5 Neurological- alert and oriented to person, place and time Skin- warm and dry, preventative dressing  to his heels, easy bruising, stage 2 pressure ulcer to his sacrum Psychiatry- normal mood and affect    Labs reviewed: Basic Metabolic Panel:  Recent Labs  11/18/15 0404  11/23/15 0601  11/25/15 0744  11/29/15 0633 11/30/15 0503 12/01/15 0509 12/02/15 0636  NA 135  < > 138  < > 135  < > 138 137 137 137  K 3.8  < > 3.9  < > 3.9  < > 3.5 3.5 3.4* 4.1  CL 111  < > 109  < > 106  < > 107 106 105 104  CO2 16*  < > 19*  < > 18*  < > 23 21* 22 22  GLUCOSE 229*  < > 118*  < > 236*  < > 172* 222* 253* 235*  BUN 41*  < > 56*  < > 57*  < > 75* 81* 75* 80*  CREATININE 3.20*  < > 3.63*  < > 3.54*  < > 3.61* 3.69* 3.58* 4.00*  CALCIUM 8.5*  < > 8.2*  < > 8.4*  < > 8.4* 8.2* 8.5* 8.4*  MG 1.8  --  1.6*  --  1.6*  --   --   --   --   --   PHOS 5.1*  < >  --   < > 4.4  < > 4.5 4.5 4.4  --   < > = values in this interval not displayed. Liver Function Tests:  Recent Labs  11/15/15 1830 11/15/15 2328  11/23/15 1948  11/29/15 0633 11/30/15 0503 12/01/15 0509  AST 23 29  --  16  --   --   --   --   ALT  14* 14*  --  13*  --   --   --   --   ALKPHOS 94 82  --  76  --   --   --   --   BILITOT 0.6 0.3  --  0.4  --   --   --   --   PROT 7.2 6.9  --  5.7*  --   --   --   --   ALBUMIN 1.9* 1.8*  < > 1.6*  < > 1.7* 1.8* 1.7*  < > = values in this interval not displayed. No results for input(s): LIPASE, AMYLASE in the last 8760 hours.  Recent Labs  11/15/15 1830  AMMONIA 19   CBC:  Recent Labs  11/18/15 0404  11/23/15 1948  11/28/15 0813 11/29/15 0633 12/02/15 0636  WBC 11.3*  < > 10.4  < > 10.8* 11.2* 12.0*  NEUTROABS 6.4  --  5.1  --   --   --  5.9  HGB 7.7*  < > 7.3*  < > 9.0* 8.9* 8.3*  HCT 24.0*  < > 22.4*  < > 28.6* 28.2* 26.3*  MCV 87.0  < > 88.5  < > 91.1 90.7 92.0  PLT 337  < > 405*  < > 373 367 320  < > = values in this interval not displayed. Cardiac Enzymes:  Recent Labs  11/16/15 0630 11/16/15 0914 11/20/15 0326 11/24/15 0912  CKTOTAL  --   --  55  --   TROPONINI 7.46* 7.46*  --  0.25*   BNP: Invalid input(s): POCBNP CBG:  Recent Labs  12/03/15 2120 12/04/15 0627 12/04/15 1118  GLUCAP 186* 159* 209*    Radiological Exams: Dg Chest 2 View  Result Date: 11/24/2015 CLINICAL DATA:  Fluid overload, pain  left posterior chest region EXAM: CHEST  2 VIEW COMPARISON:  11/15/2015, 01/09/2013 FINDINGS: Mildly elevated left diaphragm. On the lateral view, there is hazy posterior lung opacification possibly related to an effusion, likely on the left side. Heart size is normal. Pulmonary vascularity within normal limits. No pneumothorax. IMPRESSION: Probable left-sided pleural effusion, best seen on lateral view with atelectasis or infiltrate at the left lung base. Electronically Signed   By: Donavan Foil M.D.   On: 11/24/2015 22:10   Mr Brain Wo Contrast  Result Date: 11/17/2015 CLINICAL DATA:  Progressive gait instability and speech disturbance. Obtunded. EXAM: MRI HEAD WITHOUT CONTRAST TECHNIQUE: Multiplanar, multiecho pulse sequences of the brain and  surrounding structures were obtained without intravenous contrast. COMPARISON:  Head CT 11/15/2015 and 01/09/2013 FINDINGS: Brain: Diffusion imaging does not show any acute or subacute infarction. The brainstem is normal. There are a few old small vessel cerebellar infarctions. Cerebral hemispheres show atrophy with mild chronic small-vessel ischemic changes of the deep white matter. There is an old infarction in the right caudate/ periventricular deep white matter with hemosiderin deposition. Ventricles are asymmetry with the right lateral ventricle being larger than the left, but this is felt to be largely normal variant, possibly with some contribution from the old periventricular infarction on the right. No evidence of intra-axial mass lesion, acute hemorrhage or extra-axial collection. The patient does show an extensive extra-axial mass along the planum sphenoidale with probable invasion of the cavernous sinuses and extension along the middle cranial fossae on the left medially. This is most likely represent an extensive meningioma. Postcontrast evaluation recommended when able. Vascular: Major vessels the base of the brain show flow. Skull and upper cervical spine: Chronic benign sclerotic focus in the inferior left frontal bone is unchanged and not significant. Hypercellular pattern of the clivus is correlated with the previous CT studies and is probably stable. This is probably a normal marrow pattern,, though it could possibly relate in some way to the meningioma. Sinuses/Orbits: Paranasal sinuses are clear. There are bilateral mastoid effusions. Other: None significant IMPRESSION: No evidence of acute infarction. Atrophy and chronic small-vessel ischemic changes. Ventricular asymmetry with the right lateral ventricle being more prominent is a chronic finding. Extensive extra-axial mass lesion along the floor of the anterior cranial fossa/ planum sphenoidale region most consistent with meningioma. This may  involve the cavernous sinuses and does extend along the medial aspect of the middle cranial fossa on the left. Postcontrast evaluation suggested when able. Indeterminate marrow pattern of the clivus. This can be re-evaluated after contrast administration. Electronically Signed   By: Nelson Chimes M.D.   On: 11/17/2015 07:27   Mr Cervical Spine Wo Contrast  Result Date: 11/17/2015 CLINICAL DATA:  Acute onset of altered mental status. Found unresponsive on the ground. EXAM: MRI CERVICAL SPINE WITHOUT CONTRAST TECHNIQUE: Multiplanar, multisequence MR imaging of the cervical spine was performed. No intravenous contrast was administered. COMPARISON:  MRI brain same day FINDINGS: The study suffers from considerable motion degradation. Alignment: Straightening of the normal cervical lordosis. Vertebrae: Abnormal marrow pattern of the clivus as discussed at brain imaging. This could represent benign hypercellular marrow, but re-evaluation after contrast administration is suggested, particularly given the finding of the skullbase meningioma at brain imaging. No marrow or focal bone lesion seen in the cervical region proper. Cord: No primary cord lesion. Spinal stenosis C4-5 causes some deformity of the cord common Tower. Posterior Fossa, vertebral arteries, paraspinal tissues: Few old small vessel cerebellar infarctions. Vessels are patent. Disc levels:  Foramen magnum and C1-2:  Wide patency. C2-3:  Wide patency. C3-4: Mild facet degeneration. Mild disc bulge. No significant stenosis. C4-5: Suspicion of ossification of the posterior longitudinal ligament. Spondylosis with endplate osteophytes and bulging disc material. Effacement of the subarachnoid space surrounding the cord without evidence of true cord compression. Detail is somewhat limited, but AP diameter of the canal probably measures 7.5 mm. There is bilateral foraminal stenosis. C5-6: Suspicion of ossification of the posterior longitudinal ligament. Spondylosis  with endplate osteophytes and protruding disc material. Effacement of the subarachnoid space anteriorly. AP diameter of the canal 8 mm. Bilateral foraminal stenosis could affect the C6 nerve roots. C6-7: Suspicion of 0PLL. Spondylosis. Effacement of the ventral subarachnoid space. No cord compression. Foraminal narrowing left more than right. C7-T1: Suspicion of 0PLL. Spondylosis. Narrowing of the ventral subarachnoid space but no cord compression. No foraminal stenosis. IMPRESSION: Motion degraded exam. Suspicion of ossification of the posterior longitudinal ligament from C4 through C7. This is not definite and could represent changes associated with spondylosis. In combination with spondylosis, there is canal narrowing from C4-5 through C7-T1, most pronounced at C4-5, C5-6 and C6-7. AP diameter of canal probably measures between 7 and 9 mm throughout that region. There is effacement of the subarachnoid space surrounding the cord in that region. Foraminal stenosis at C4-5, C5-6 and C6-7. Re- demonstration of indeterminate marrow pattern of the clivus. See above discussion. This should be re-evaluated at postcontrast brain imaging as previously discussed. Electronically Signed   By: Nelson Chimes M.D.   On: 11/17/2015 07:35   Mr Abdomen Wo Contrast  Result Date: 11/21/2015 CLINICAL DATA:  65 year old male inpatient with renal failure and indeterminate cystic right renal mass on ultrasound, presenting for further characterization. EXAM: MRI ABDOMEN WITHOUT CONTRAST TECHNIQUE: Multiplanar multisequence MR imaging was performed without the administration of intravenous contrast. COMPARISON:  11/19/2015 renal sonogram and 12/15/2014 unenhanced CT abdomen/ pelvis. FINDINGS: Study is significantly motion degraded. Lower chest: Moderate left pleural effusion. Hepatobiliary: Normal liver size and configuration. There is prominent iron deposition throughout the liver. No liver mass. Cholecystectomy. Bile ducts are within  expected post cholecystectomy limits with mild central intrahepatic biliary ductal dilatation and common bile duct diameter 8 mm. No choledocholithiasis. Pancreas: No pancreatic duct dilation. There is a unilocular lobulated 2.0 x 1.1 x 1.1 cm cystic pancreatic lesion in the pancreatic head (series 7/ image 33). No additional pancreatic lesions. No evidence of pancreas divisum. Spleen: Normal size. No mass. There is prominent iron deposition throughout the spleen. Adrenals/Urinary Tract: No discrete adrenal nodules. No hydronephrosis. There is a 3.2 x 2.7 x 2.8 cm renal cortical mass in the upper right kidney (series 6/ image 66), which demonstrates heterogeneous T1 hyperintensity and mixed T2 signal intensity, with apparent 1.8 cm posterior mural nodule. There are similar appearing partially exophytic complex renal cysts in both kidneys, measuring 3.4 x 3.1 cm in the posterior lower right kidney (series 7/image 51) and 2.8 x 2.4 cm in the lateral interpolar left kidney (series 7/image 43), both demonstrating T1 hyperintensity and fluid debris levels on T2 imaging. Additional subcentimeter T2 hyperintense simple appearing renal cysts are present in both kidneys . Stomach/Bowel: Grossly normal stomach. Visualized small and large bowel is normal caliber, with no bowel wall thickening. Vascular/Lymphatic: There is apparent contrast material within the blood pool, possibly from recent administration of contrast given the provided history of renal failure (IV contrast was not administered on the scan). Atherosclerotic nonaneurysmal abdominal aorta. Retrocaval adenopathy measuring up to 1.8 cm (  series 7/ image 41), increased from 1.4 cm on 12/15/2014. Left para-aortic adenopathy measuring up to 02.0 cm (series 7/image 48), increased from 1.7 cm on 12/15/2014. Other: No abdominal ascites or focal fluid collection. Musculoskeletal: No aggressive appearing focal osseous lesions. IMPRESSION: 1. Limited motion degraded  noncontrast MRI. 2. Indeterminate complex hemorrhagic/proteinaceous 3.2 cm renal cortical mass in the upper right kidney with apparent 1.8 cm mural nodule, cannot exclude renal cell carcinoma. Renal mass protocol MRI or CT of the abdomen without and with IV contrast is necessary for further characterization, when clinically feasible. 3. Additional indeterminate hemorrhagic/proteinaceous renal cortical masses in the posterior lower right kidney and lateral interpolar left kidney. 4. Retroperitoneal lymphadenopathy has increased mildly since 12/15/2014, and is suspicious for a lymphoproliferative disorder. Recommend correlation with clinical history and laboratory evaluation for possible lymphoproliferative disorder. PET-CT may be useful for further characterization and potential biopsy planning. 5. Unilocular 2.0 cm cystic pancreatic lesion in the pancreatic head without main pancreatic duct dilation, incompletely characterized on this noncontrast study. Pancreas protocol MRI abdomen without and with IV contrast is recommended in 6 months. This recommendation follows ACR consensus guidelines: Management of Incidental Pancreatic Cysts: A White Paper of the ACR Incidental Findings Committee. [place] 8546;[id number]. 6. Moderate left pleural effusion. 7. Prominent iron deposition throughout the liver and spleen, probably transfusion related. Electronically Signed   By: Ilona Sorrel M.D.   On: 11/21/2015 13:01   US Renal  Result Date: 11/19/2015 CLINICAL DATA:  Acute renal insufficiency EXAM: RENAL / URINARY TRACT ULTRASOUND COMPLETE COMPARISON:  None. FINDINGS: Right Kidney: Length: 12.2 cm. Echogenicity is increased. Renal cortical thickness is normal. No perinephric fluid or hydronephrosis visualized. There is a cystic mass arising from the upper pole of the right kidney measuring 2.5 x 2.8 x 2.2 cm. Within this cystic mass, there is a solid-appearing focus measuring 1.4 x 1.2 x 1.2 cm. There is no  sonographically demonstrable calculus or ureterectasis. Left Kidney: Length: 11.6 cm. Echogenicity is increased. Renal cortical thickness is normal. No perinephric fluid or hydronephrosis visualized. There is a cyst arising from the left kidney measuring 3.0 x 2.6 x 2.8 cm. No sonographically demonstrable calculus or ureterectasis. Bladder: Appears normal for degree of bladder distention. IMPRESSION: Kidneys are echogenic consistent with medical renal disease. No obstructing focus identified on either side. There is a cystic lesion arising from the upper pole of the right kidney which contains a solid focus. This appearance raises concern for potential renal neoplasm with a cystic component. Further evaluation with pre and post contrast MRI or CT should be considered. MRI is preferred in younger patients (due to lack of ionizing radiation) and for evaluating calcified lesion(s). Given the elevated creatinine, MRI may be a better choice for further assessment if there is no contraindication to MR imaging. There is a simple cyst in the left kidney. These results will be called to the ordering clinician or representative by the Radiologist Assistant, and communication documented in the PACS or zVision Dashboard. Electronically Signed   By: Lowella Grip III M.D.   On: 11/19/2015 14:57   Dg Chest Portable 1 View  Result Date: 11/15/2015 CLINICAL DATA:  Altered mental status.  The patient was found down. EXAM: PORTABLE CHEST 1 VIEW COMPARISON:  01/09/2013 FINDINGS: The heart size and mediastinal contours are within normal limits. Both lungs are clear. The visualized skeletal structures are unremarkable. IMPRESSION: Normal exam. Electronically Signed   By: Lorriane Shire M.D.   On: 11/15/2015 19:11  Dg Bone Survey Met  Addendum Date: 11/26/2015   ADDENDUM REPORT: 11/26/2015 18:37 ADDENDUM: Bilateral twelfth rib fractures noted. Electronically Signed   By: Nolon Nations M.D.   On: 11/26/2015 18:37    Result Date: 11/26/2015 CLINICAL DATA:  Elevated serum immunoglobulin free light chains. Pt could not stand or roll onto his side. Best obtainable images due to pt condition. EXAM: METASTATIC BONE SURVEY COMPARISON:  Chest x-ray 11/24/2015, abdominal film 09/16/2015 FINDINGS: Images of the axial and appendicular skeleton are performed. There are acute fractures of the left fourth and fifth ribs. Heart size is normal. There is dense opacity in the left lower lobe and left pleural effusion. Evaluation of the spine is limited by limited patient mobility. Degenerative changes are identified in the mid cervical spine. There is no acute fracture identified. Degenerative changes are identified throughout the thoracic and lumbar spine. There is vertebral body height loss at L3, likely chronic based on its appearance. Deformity of the left superior pubic ramus and possibly left inferior pubic ramus consistent with fracture. No suspicious lytic or blastic lesions are identified. There is dense atherosclerotic calcification of the imaged vessels. Incidental note of subcutaneous edema. Surgical clips are noted in the right upper quadrant of the abdomen. IMPRESSION: 1. No suspicious lytic or blastic lesions. 2. Acute fractures of the left fourth and fifth ribs. 3. Acute fractures of the left superior and possibly inferior pubic ramus. 4. Atherosclerotic disease.  Subcutaneous edema. Electronically Signed: By: Nolon Nations M.D. On: 11/26/2015 15:29   Dg Swallowing Func-speech Pathology  Result Date: 11/19/2015 Objective Swallowing Evaluation: Type of Study: MBS-Modified Barium Swallow Study Patient Details Name: BEJAMIN HACKBART MRN: [id number] Date of Birth: 1950-11-10 Today's Date: 11/19/2015 Time: SLP Start Time (ACUTE ONLY): 1110-SLP Stop Time (ACUTE ONLY): 1132 SLP Time Calculation (min) (ACUTE ONLY): 22 min Past Medical History: Past Medical History: Diagnosis Date . BPH (benign prostatic hyperplasia)  . Chronic  kidney disease  . Depression  . Diabetes mellitus without complication ([place])  . GERD (gastroesophageal reflux disease)  . Hyperlipidemia  . Hypertension  . Hypothyroidism  . TIA (transient ischemic attack)  Past Surgical History: Past Surgical History: Procedure Laterality Date . APPENDECTOMY   . CHOLECYSTECTOMY   . CYSTOURETHROSCOPY   . POLYPECTOMY    Vocal cord polyp removed . VASECTOMY   HPI: 65 yo male with hx uncontrolled DM, CKD 3, GERD, HTN, previous TIA, early dementia presented 10/30 with 1 week hx progressive weakness then found this am by his girlfriend/caregiver with AMS, unable to speak or get up. In ER was noted to have blood glucose >800, AKI with Scr 3.5, metabolic acidosis, HTN with SBP 200. He was admitted by [facility] with DKA/HHNK but there was some concern for CVA and MRI was ordered. In MRI pt had episode of emesis, study was aborted.  Subjective: alert Assessment / Plan / Recommendation CHL IP CLINICAL IMPRESSIONS 11/19/2015 Therapy Diagnosis Moderate pharyngeal phase dysphagia;Mild cervical esophageal phase dysphagia Clinical Impression Pt demonstrates a mild to moderate oropharyngeal dysphagia with mild motor and sensory deficits. The pts swallow is slightly delayed, combined with mildly decreased mobility of the hyolaryngeal complex. This results in penetration before, during and after the swallow, sometimes aspiration if the bolus is large. Sensation is absent in the laryngeal vestibule and delayed in the subglottis. Mild to moderate residuals with solids and liquids also result in penetration events post swallow. Positional changes were ineffective and, with a chin tuck, actually blocked the passage to the bolus,  likely due to appearance of bony protrusions on the cervical esophagus at the level of the UES that impede bolus transit. This may also impact hyolaryngeal mobility. Best strategies to reduce aspiration risk are two swallows and intermittent throat clearing, following solids with  liquids and taking small sips, no straws. Offered verbal and written instruction to pt. Will f/u for further training with a regular diet and thin liquids.  Impact on safety and function Moderate aspiration risk   CHL IP TREATMENT RECOMMENDATION 11/19/2015 Treatment Recommendations Therapy as outlined in treatment plan below   Prognosis 11/19/2015 Prognosis for Safe Diet Advancement Good Barriers to Reach Goals -- Barriers/Prognosis Comment -- CHL IP DIET RECOMMENDATION 11/19/2015 SLP Diet Recommendations Regular solids;Thin liquid Liquid Administration via Cup;No straw Medication Administration Whole meds with puree Compensations Slow rate;Small sips/bites;Follow solids with liquid;Multiple dry swallows after each bite/sip;Clear throat intermittently Postural Changes Seated upright at 90 degrees   CHL IP OTHER RECOMMENDATIONS 11/19/2015 Recommended Consults -- Oral Care Recommendations Oral care BID Other Recommendations --   CHL IP FOLLOW UP RECOMMENDATIONS 11/19/2015 Follow up Recommendations Inpatient Rehab   CHL IP FREQUENCY AND DURATION 11/19/2015 Speech Therapy Frequency (ACUTE ONLY) min 2x/week Treatment Duration 2 weeks      CHL IP ORAL PHASE 11/19/2015 Oral Phase WFL Oral - Pudding Teaspoon -- Oral - Pudding Cup -- Oral - Honey Teaspoon -- Oral - Honey Cup -- Oral - Nectar Teaspoon -- Oral - Nectar Cup -- Oral - Nectar Straw -- Oral - Thin Teaspoon -- Oral - Thin Cup -- Oral - Thin Straw -- Oral - Puree -- Oral - Mech Soft -- Oral - Regular -- Oral - Multi-Consistency -- Oral - Pill -- Oral Phase - Comment --  CHL IP PHARYNGEAL PHASE 11/19/2015 Pharyngeal Phase Impaired Pharyngeal- Pudding Teaspoon -- Pharyngeal -- Pharyngeal- Pudding Cup -- Pharyngeal -- Pharyngeal- Honey Teaspoon -- Pharyngeal -- Pharyngeal- Honey Cup -- Pharyngeal -- Pharyngeal- Nectar Teaspoon -- Pharyngeal -- Pharyngeal- Nectar Cup -- Pharyngeal -- Pharyngeal- Nectar Straw -- Pharyngeal -- Pharyngeal- Thin Teaspoon -- Pharyngeal --  Pharyngeal- Thin Cup Delayed swallow initiation-pyriform sinuses;Reduced laryngeal elevation;Reduced anterior laryngeal mobility;Pharyngeal residue - valleculae;Pharyngeal residue - pyriform;Penetration/Aspiration before swallow;Penetration/Aspiration during swallow;Penetration/Apiration after swallow;Trace aspiration;Compensatory strategies attempted (with notebox) Pharyngeal Material does not enter airway;Material enters airway, remains ABOVE vocal cords and not ejected out;Material enters airway, CONTACTS cords and not ejected out;Material enters airway, passes BELOW cords then ejected out;Material enters airway, passes BELOW cords without attempt by patient to eject out (silent aspiration) Pharyngeal- Thin Straw Delayed swallow initiation-pyriform sinuses;Reduced laryngeal elevation;Reduced anterior laryngeal mobility;Pharyngeal residue - valleculae;Pharyngeal residue - pyriform;Penetration/Aspiration before swallow;Penetration/Aspiration during swallow;Penetration/Apiration after swallow;Trace aspiration;Compensatory strategies attempted (with notebox) Pharyngeal Material enters airway, passes BELOW cords without attempt by patient to eject out (silent aspiration);Material enters airway, passes BELOW cords then ejected out Pharyngeal- Puree Reduced laryngeal elevation;Reduced anterior laryngeal mobility;Pharyngeal residue - valleculae;Pharyngeal residue - pyriform Pharyngeal Material does not enter airway Pharyngeal- Mechanical Soft -- Pharyngeal -- Pharyngeal- Regular Reduced laryngeal elevation;Reduced anterior laryngeal mobility;Pharyngeal residue - valleculae;Pharyngeal residue - pyriform Pharyngeal -- Pharyngeal- Multi-consistency -- Pharyngeal -- Pharyngeal- Pill -- Pharyngeal -- Pharyngeal Comment Delayed swallow initiation-pyriform sinuses;Reduced laryngeal elevation;Reduced anterior laryngeal mobility;Pharyngeal residue - valleculae;Pharyngeal residue - pyriform;Penetration/Aspiration before  swallow;Penetration/Aspiration during swallow;Penetration/Apiration after swallow;Trace aspiration;Compensatory strategies attempted (with notebox)  CHL IP CERVICAL ESOPHAGEAL PHASE 11/19/2015 Cervical Esophageal Phase Impaired Pudding Teaspoon -- Pudding Cup -- Honey Teaspoon -- Honey Cup -- Nectar Teaspoon -- Nectar Cup -- Nectar Straw -- Thin Teaspoon -- Thin Cup --  Thin Straw -- Puree -- Mechanical Soft -- Regular -- Multi-consistency -- Pill -- Cervical Esophageal Comment With a chin tuck, thin liquids would not pass the cervical esophagus, likely due to bony protrusion on anterior cervical spine CHL IP GO 08/21/2015 Functional Assessment Tool Used Western Aphasia Battery- REvised, clinical judgment Functional Limitations Spoken language expressive Swallow Current Status [phone]) (None) Swallow Goal Status (O3500) (None) Swallow Discharge Status [phone]) (None) Motor Speech Current Status [phone]) (None) Motor Speech Goal Status [phone]) (None) Motor Speech Goal Status (E9381) (None) Spoken Language Comprehension Current Status [phone]) (None) Spoken Language Comprehension Goal Status (W2585) (None) Spoken Language Comprehension Discharge Status [phone]) (None) Spoken Language Expression Current Status (U2353) CJ Spoken Language Expression Goal Status (I1443) CI Spoken Language Expression Discharge Status [phone]) (None) Attention Current Status (Q6761) (None) Attention Goal Status (P5093) (None) Attention Discharge Status (O6712) (None) Memory Current Status (W5809) (None) Memory Goal Status (X8338) (None) Memory Discharge Status (S5053) (None) Voice Current Status (Z7673) (None) Voice Goal Status (A1937) (None) Voice Discharge Status (T0240) (None) Other Speech-Language Pathology Functional Limitation [phone]) (None) Other Speech-Language Pathology Functional Limitation Goal Status (G9924) (None) Other Speech-Language Pathology Functional Limitation Discharge Status [phone]) (None) Herbie Baltimore, MA CCC-SLP [phone]  DeBlois, Katherene Ponto 11/19/2015, 11:57 AM              Ct Head Code Stroke Wo Contrast`  Result Date: 11/15/2015 CLINICAL DATA:  Code stroke. Altered mental status. Not using RIGHT side. EXAM: CT HEAD WITHOUT CONTRAST TECHNIQUE: Contiguous axial images were obtained from the base of the skull through the vertex without intravenous contrast. COMPARISON:  CT head 01/09/2013. FINDINGS: Brain: Advanced atrophy with chronic microvascular ischemic change. No visible hemorrhage, mass lesion, hydrocephalus, or extra-axial fluid. Vascular: No hyperdense vessel or parenchymal calcification. Advanced vascular calcification in the carotid siphon and distal vertebral arteries. No features suggestive of large vessel occlusion. Chronic periventricular deep white matter infarct, RIGHT parietal lobe. Skull: Normal. Negative for fracture or focal lesion. Sinuses/Orbits: No acute finding. Other: None ASPECTS ([place] Stroke Program Early CT Score) - Ganglionic level infarction (caudate, lentiform nuclei, internal capsule, insula, M1-M3 cortex): 7 - Supraganglionic infarction (M4-M6 cortex): 3 Total score (0-10 with 10 being normal): 10 IMPRESSION: 1. Atrophy and small vessel disease. No acute intracranial findings are evident. 2. ASPECTS is 10. These results were called by telephone at the time of interpretation on 11/15/2015 at 7:25 pm to Dr. Leonard Schwartz , who verbally acknowledged these results. Electronically Signed   By: Staci Righter M.D.   On: 11/15/2015 19:30    Assessment/Plan  Unsteady gait With lower extremity weakness. Will have him work with physical therapy and occupational therapy team to help with gait training and muscle strengthening exercises.fall precautions. Skin care. Encourage to be out of bed.   Generalized weakness From deconditioning and his unsteady gait along with medical co-morbidities. Will have patient work with PT/OT as tolerated to regain strength and restore function.  Fall  precautions are in place.  Leukocytosis Afebrile. Monitor wbc and temp curve. Likely reactive leukocytosis  Protein calorie malnutrition Low albumin level and patient mentions about weight loss. RD consult to assess further. Monitor weekly weight  DM type 2 with neuropathy Lab Results  Component Value Date   HGBA1C 10.0 (H) 11/16/2015   a1c is suggestive of poorly controlled diabetes. Monitor cbg. Continue lantus 15 u daily with humalog 9 u tid with meals. Continue aspirin and statin  Stage 2 sacral pressure ulcer Provide wound care, pressure ulcer prophylaxis. Add decubivite  to promote wound healing.  NSTEMI Echocardiogram EF 50-55%. Seen by cardiology in hospital. Will need f/u with cardiology in 1-2 week for possible angiography. Continue coreg 25 mg bid and brilinta 90 mg bid with aspirin 81 mg daily. Continue atorvastatin   Cervical spondylosis with myelopathy Continue norco 5-325 mg q6h prn pain. Will have patient work with PT/OT as tolerated to regain strength and restore function. Fall precautions are in place. He has LLE weakness and urinary incontinence as well. Consider neurosurgery referral given his symptom for evaluation.  Urinary incontinence Per patient and family, this is a new complaint that started this admission. Has stage 2 pressure ulcer. Will need skin care and frequent diaper change to prevent skin dermatitis and moisture related skin damage. Will get urology referral to assess further.  Right kidney cystic lesion Seen by nephrology, urology and IR at the hospital. Plan is for renal ultrasound vs MRI with contrast in 3-6 months for re-evaluation.   Elevated kappa/ lambda light chains Bone survey negative for lytic and blastic lesions. Seen by oncology inpaitent and plan is for outpatient follow up 12/15/15  Dementia without behavioral disturbance Continue aricept 10 mg daily. Provide supportive care.   HTN Monitor BP reading. Currently on coreg 25 mg bid,  norvasc 10 mg daily, demadex 20 mg bid and hydralazine 50 mg tid.   Anemia of chronic disease S/p 2 u prbc transfusion in hospital. Received aranesp and feraheme in hospital. Monitor cbc. Continue ferrous sulfate 325 mg bid and vitamin c.  Possible meningioma MRI brain reviewed by neurosurgery. Plan is for MRI brain in 6 months for re-evaluation. Monitor clinically.  Rib fracture Continue norco prn for pain management and monitor  Left pubic ramus fracture To provide pain management. Gait training with PT and safety awareness with OT. Orthopedic follow up in 2 weeks  ckd stage 3 On sodium bicarbonate 1300 mg bid  Hypothyroidism Continue synthroid  GERD Continue PPI   Goals of care: short term rehabilitation   Labs/tests ordered: cbc, cmp  Family/ staff Communication: reviewed care plan with patient, his family and nursing supervisor    Blanchie Serve, MD Internal Medicine [place], [place] [address] Cell Phone (Monday-Friday 8 am - 5 pm): [phone] On Call: [phone] and follow prompts after 5 pm and on weekends Office Phone: [phone] Office Fax: [phone]

## 2015-12-08 LAB — BASIC METABOLIC PANEL
BUN: 70 mg/dL — AB (ref 4–21)
CREATININE: 4.5 mg/dL — AB (ref 0.6–1.3)
GLUCOSE: 140 mg/dL
POTASSIUM: 3.8 mmol/L (ref 3.4–5.3)
Sodium: 139 mmol/L (ref 137–147)

## 2015-12-08 LAB — CBC AND DIFFERENTIAL
HCT: 24 % — AB (ref 41–53)
HEMOGLOBIN: 7.5 g/dL — AB (ref 13.5–17.5)
Platelets: 279 10*3/uL (ref 150–399)
WBC: 7.9 10*3/mL

## 2015-12-08 LAB — HEPATIC FUNCTION PANEL
ALT: 9 U/L — AB (ref 10–40)
AST: 10 U/L — AB (ref 14–40)
Alkaline Phosphatase: 107 U/L (ref 25–125)
Bilirubin, Total: 0.3 mg/dL

## 2015-12-09 ENCOUNTER — Non-Acute Institutional Stay (SKILLED_NURSING_FACILITY): Payer: MEDICARE | Admitting: Family

## 2015-12-09 DIAGNOSIS — N184 Chronic kidney disease, stage 4 (severe): Secondary | ICD-10-CM

## 2015-12-09 DIAGNOSIS — R600 Localized edema: Secondary | ICD-10-CM | POA: Diagnosis not present

## 2015-12-09 DIAGNOSIS — D638 Anemia in other chronic diseases classified elsewhere: Secondary | ICD-10-CM | POA: Diagnosis not present

## 2015-12-09 DIAGNOSIS — E440 Moderate protein-calorie malnutrition: Secondary | ICD-10-CM

## 2015-12-09 NOTE — Progress Notes (Signed)
Location:  [facility] Room Number: 105 Place of Service:  SNF [phone]) Provider:  Ashani Pumphrey  FNP-C   Kym Groom Guy Begin, MD  Patient Care Team: [name], MD as PCP - General [facility])  Extended Emergency Contact Information Primary Emergency Contact: [facility] Address: [address]          [place], [place] [address] [place] Phone: [phone] Mobile Phone: [phone] Relation: Significant other  Code Status: Full Code  Goals of care: Advanced Directive information Advanced Directives 11/23/2015  Does Patient Have a Medical Advance Directive? Yes  Type of Advance Directive [place]  Does patient want to make changes to medical advance directive? -  Copy of [place] in Chart? -     Chief Complaint  Patient presents with  . Acute Visit    abnormal lab results     HPI:  Pt is a 65 y.o. male seen today at [facility]  for an acute visit for evaluation of abnormal lab results. He has a significant medical history of MI,  CKD stage 3, HTN, Type 2 DM, BPH among other conditions. He is seen in his room today. His recent lab results showed worsening renal functions CR 4.45, BUN 70, CA 7.9, TP 5.7, Alb 1.97, Hgb 7.5, HCT 24.3 ( 12/08/2015). He reports episode of nausea prior to visit but was given antiemetic with relief. He denies any fever, chills or abdominal pain. Facility Nurse reports no new concerns.     Past Medical History:  Diagnosis Date  . BPH (benign prostatic hyperplasia)   . Chronic kidney disease   . Depression   . Diabetes mellitus without complication ([place])   . GERD (gastroesophageal reflux disease)   . Hyperlipidemia   . Hypertension   . Hypothyroidism   . TIA (transient ischemic attack)    Past Surgical History:  Procedure Laterality Date  . APPENDECTOMY    . CHOLECYSTECTOMY    . CYSTOURETHROSCOPY    .  POLYPECTOMY     Vocal cord polyp removed  . VASECTOMY      Allergies  Allergen Reactions  . Penicillins Other (See Comments)    Unknown  . Tetracyclines & Related Other (See Comments)    Unknown      Medication List       Accurate as of 12/09/15  6:54 PM. Always use your most recent med list.          amLODipine 10 MG tablet Commonly known as:  NORVASC Take 10 mg by mouth daily.   aspirin 81 MG EC tablet Take 1 tablet (81 mg total) by mouth daily.   atorvastatin 80 MG tablet Commonly known as:  LIPITOR Take 80 mg by mouth daily.   carvedilol 25 MG tablet Commonly known as:  COREG Take 25 mg by mouth 2 (two) times daily with a meal.   donepezil 10 MG tablet Commonly known as:  ARICEPT Take 1 tablet (10 mg total) by mouth at bedtime.   ferrous sulfate 325 (65 FE) MG tablet Take 325 mg by mouth 2 (two) times daily.   folic acid 1 MG tablet Commonly known as:  FOLVITE Take 1 tablet (1 mg total) by mouth daily.   hydrALAZINE 50 MG tablet Commonly known as:  APRESOLINE Take 50 mg by mouth every 8 (eight) hours.   HYDROcodone-acetaminophen 5-325 MG tablet Commonly known as:  NORCO/VICODIN Take 1  tablet by mouth every 6 (six) hours as needed for moderate pain.   insulin glargine 100 UNIT/ML injection Commonly known as:  LANTUS Inject 15 Units into the skin daily.   insulin lispro 100 UNIT/ML injection Commonly known as:  HUMALOG Inject 9 Units into the skin 3 (three) times daily with meals.   levothyroxine 25 MCG tablet Commonly known as:  SYNTHROID, LEVOTHROID Take 1 tablet (25 mcg total) by mouth daily before breakfast.   montelukast 10 MG tablet Commonly known as:  SINGULAIR Take 10 mg by mouth at bedtime.   pantoprazole 40 MG tablet Commonly known as:  PROTONIX Take 1 tablet (40 mg total) by mouth daily.   sodium bicarbonate 650 MG tablet Take 1,300 mg by mouth 2 (two) times daily.   ticagrelor 90 MG Tabs tablet Commonly known as:   BRILINTA Take 1 tablet (90 mg total) by mouth 2 (two) times daily.   torsemide 20 MG tablet Commonly known as:  DEMADEX Take 20 mg by mouth 2 (two) times daily.   vitamin C 250 MG tablet Commonly known as:  ASCORBIC ACID Take 250 mg by mouth 2 (two) times daily.       Review of Systems  Constitutional: Negative for activity change, appetite change, chills, fatigue and fever.  HENT: Negative for congestion, rhinorrhea, sinus pain, sinus pressure, sneezing and sore throat.   Eyes: Negative.   Respiratory: Negative for cough, chest tightness and wheezing.   Gastrointestinal: Negative for abdominal distention, abdominal pain, constipation, diarrhea and vomiting.       Nausea prior to visit but resolved with meds.   Genitourinary: Negative for dysuria, flank pain, frequency and urgency.  Musculoskeletal: Positive for gait problem.  Skin: Negative for color change, pallor and rash.       Right heel and sacral ulcers.   Neurological: Negative for dizziness, seizures, syncope, light-headedness and headaches.  Hematological: Does not bruise/bleed easily.  Psychiatric/Behavioral: Negative for agitation, confusion, hallucinations and sleep disturbance. The patient is not nervous/anxious.     Immunization History  Administered Date(s) Administered  . Influenza,inj,Quad PF,36+ Mos 11/26/2015  . PPD Test 12/04/2015   Pertinent  Health Maintenance Due  Topic Date Due  . FOOT EXAM  07/29/1959  . OPHTHALMOLOGY EXAM  07/29/1959  . URINE MICROALBUMIN  07/29/1959  . COLONOSCOPY  07/29/1999  . PNA vac Low Risk Adult (1 of 2 - PCV13) 07/29/2014  . HEMOGLOBIN A1C  05/16/2016  . INFLUENZA VACCINE  Completed      Vitals:   12/09/15 0930  BP: (!) 137/57  Pulse: 73  Resp: 18  Temp: 98.4 F (36.9 C)  SpO2: 97%  Weight: 207 lb (93.9 kg)  Height: 5\' 11"  (1.803 m)   Body mass index is 28.87 kg/m. Physical Exam  Constitutional: He is oriented to person, place, and time. He appears  well-developed and well-nourished. No distress.  HENT:  Head: Normocephalic.  Mouth/Throat: Oropharynx is clear and moist. No oropharyngeal exudate.  Eyes: Conjunctivae and EOM are normal. Pupils are equal, round, and reactive to light. Right eye exhibits no discharge. Left eye exhibits no discharge. No scleral icterus.  Neck: Normal range of motion. No JVD present. No thyromegaly present.  Cardiovascular: Normal rate, regular rhythm, normal heart sounds and intact distal pulses.  Exam reveals no gallop and no friction rub.   No murmur heard. Pulmonary/Chest: Effort normal and breath sounds normal. No respiratory distress. He has no wheezes. He has no rales.  Abdominal: Soft. Bowel sounds are normal.  He exhibits no distension. There is no tenderness. There is no rebound and no guarding.  Musculoskeletal: He exhibits no tenderness or deformity.  Unsteady. Moves x 4 extremities. 1-2 + pitting edema to lower extremities.   Lymphadenopathy:    He has no cervical adenopathy.  Neurological: He is oriented to person, place, and time.  Skin: Skin is warm and dry. No rash noted. No erythema. No pallor.  Right heel and sacral wound drsg dry, clean and intact. Surrounding skin tissues without any signs of infections.   Psychiatric: He has a normal mood and affect.    Labs reviewed:  Recent Labs  11/18/15 0404  11/23/15 0601  11/25/15 0744  11/29/15 [id number] 11/30/15 0503 12/01/15 0509 12/02/15 0636 12/08/15  NA 135  < > 138  < > 135  < > 138 137 137 137 139  K 3.8  < > 3.9  < > 3.9  < > 3.5 3.5 3.4* 4.1 3.8  CL 111  < > 109  < > 106  < > 107 106 105 104  --   CO2 16*  < > 19*  < > 18*  < > 23 21* 22 22  --   GLUCOSE 229*  < > 118*  < > 236*  < > 172* 222* 253* 235*  --   BUN 41*  < > 56*  < > 57*  < > 75* 81* 75* 80* 70*  CREATININE 3.20*  < > 3.63*  < > 3.54*  < > 3.61* 3.69* 3.58* 4.00* 4.5*  CALCIUM 8.5*  < > 8.2*  < > 8.4*  < > 8.4* 8.2* 8.5* 8.4*  --   MG 1.8  --  1.6*  --  1.6*  --   --    --   --   --   --   PHOS 5.1*  < >  --   < > 4.4  < > 4.5 4.5 4.4  --   --   < > = values in this interval not displayed.  Recent Labs  11/15/15 1830 11/15/15 2328  11/23/15 1948  11/29/15 [id number] 11/30/15 0503 12/01/15 0509 12/08/15  AST 23 29  --  16  --   --   --   --  10*  ALT 14* 14*  --  13*  --   --   --   --  9*  ALKPHOS 94 82  --  76  --   --   --   --  107  BILITOT 0.6 0.3  --  0.4  --   --   --   --   --   PROT 7.2 6.9  --  5.7*  --   --   --   --   --   ALBUMIN 1.9* 1.8*  < > 1.6*  < > 1.7* 1.8* 1.7*  --   < > = values in this interval not displayed.  Recent Labs  11/18/15 0404  11/23/15 1948  11/28/15 0813 11/29/15 [id number] 12/02/15 0636 12/08/15  WBC 11.3*  < > 10.4  < > 10.8* 11.2* 12.0* 7.9  NEUTROABS 6.4  --  5.1  --   --   --  5.9  --   HGB 7.7*  < > 7.3*  < > 9.0* 8.9* 8.3* 7.5*  HCT 24.0*  < > 22.4*  < > 28.6* 28.2* 26.3* 24*  MCV 87.0  < > 88.5  < > 91.1 90.7 92.0  --  PLT 337  < > 405*  < > 373 367 320 279  < > = values in this interval not displayed. Lab Results  Component Value Date   TSH 2.954 11/16/2015   Lab Results  Component Value Date   HGBA1C 10.0 (H) 11/16/2015   Lab Results  Component Value Date   CHOL 129 11/16/2015   HDL 38 (L) 11/16/2015   LDLCALC 73 11/16/2015   TRIG 89 11/16/2015   CHOLHDL 3.4 11/16/2015    Assessment/Plan 1. Chronic kidney disease (CKD), stage IV (severe) (HCC) CR 4.45 ( 12/08/2015) previous was 4.00 Will refer to Nephrology ASAP for evaluation.   2. Moderate protein-calorie malnutrition (HCC) TP 5.7, Alb 1.97 ( 12/08/2015). RD consult for protein supplements. BMP 12/16/2015.  3. Localized edema Bilateral 1-2 + edema. Continue on Torsemide 20 mg tablet twice daily. Apply bilateral knee high Ted hose on in the morning and off at bedtime. Monitor weight.   4. Anemia of chronic disease Hgb 7.5 ( 12/08/2015). Asymptomatic. Possible due to CKD stage 4. Continue to monitor CBC. Recheck CBC 12/16/2015. Refer to  Nephrology ASAP   5. Hypocalcemia Ca 7.9 ( 12/08/2015). Possible due to CKD. RD consult for supplement. Continue to monitor BMP.     Family/ staff Communication: Reviewed plan of care with patient and facility Nurse supervisor.   Labs/tests ordered: CBC, BMP 12/16/2015.

## 2015-12-14 ENCOUNTER — Non-Acute Institutional Stay (SKILLED_NURSING_FACILITY): Payer: MEDICARE | Admitting: Family

## 2015-12-14 DIAGNOSIS — G4700 Insomnia, unspecified: Secondary | ICD-10-CM | POA: Diagnosis not present

## 2015-12-14 DIAGNOSIS — IMO0002 Reserved for concepts with insufficient information to code with codable children: Secondary | ICD-10-CM

## 2015-12-14 DIAGNOSIS — R630 Anorexia: Secondary | ICD-10-CM

## 2015-12-14 DIAGNOSIS — E1142 Type 2 diabetes mellitus with diabetic polyneuropathy: Secondary | ICD-10-CM

## 2015-12-14 DIAGNOSIS — E1165 Type 2 diabetes mellitus with hyperglycemia: Secondary | ICD-10-CM | POA: Diagnosis not present

## 2015-12-14 MED ORDER — MELATONIN 3 MG PO TABS: 3.0000 mg | ORAL_TABLET | Freq: Every day | ORAL | 0 refills | Status: AC

## 2015-12-14 MED ORDER — INSULIN LISPRO 100 UNIT/ML ~~LOC~~ SOLN: 5.0000 [IU] | Freq: Three times a day (TID) | SUBCUTANEOUS | 11 refills | Status: AC

## 2015-12-14 NOTE — Progress Notes (Signed)
Location:  [facility] Room Number: 105  Place of Service:  Nursing [phone]) Provider:Dinah Ngetich FNP-C   Olmedo, Guy Begin, MD  Patient Care Team: [name], MD as PCP - General [facility] Medicine)  Extended Emergency Contact Information Primary Emergency Contact: [facility] Address: [address]          [place], [place] [address] [place] Phone: [phone] Mobile Phone: [phone] Relation: Significant other  Code Status:  Full Code  Goals of care: Advanced Directive information Advanced Directives 11/23/2015  Does Patient Have a Medical Advance Directive? Yes  Type of Advance Directive [place]  Does patient want to make changes to medical advance directive? -  Copy of [place] in Chart? -     Chief Complaint  Patient presents with  . Acute Visit    insomnia     HPI:  Pt is a 65 y.o. male seen today at [facility] for an acute visit for evaluation of inability to sleep at night. He has a medical history of HTN,type 2 DM, CKD stage 4 among other conditions. He complains of not able to sleep at night requesting for medication to help him sleep. He continues to have frequent stool but foamed. Facility Nurse reports patient had had poor appetite will eat at times and sometimes doesn't. He has occasional nausea episodes. Encouraged to take antiemetic. He is currently on scheduled Humalog worrisome for hypoglycemia with his poor appetite. Plan to change Humalog to SSI with meals.   CBG's ranging in the 80's-200's.   Past Medical History:  Diagnosis Date  . BPH (benign prostatic hyperplasia)   . Chronic kidney disease   . Depression   . Diabetes mellitus without complication ([place])   . GERD (gastroesophageal reflux disease)   . Hyperlipidemia   . Hypertension   . Hypothyroidism   . TIA (transient ischemic attack)    Past Surgical History:    Procedure Laterality Date  . APPENDECTOMY    . CHOLECYSTECTOMY    . CYSTOURETHROSCOPY    . POLYPECTOMY     Vocal cord polyp removed  . VASECTOMY      Allergies  Allergen Reactions  . Penicillins Other (See Comments)    Unknown  . Tetracyclines & Related Other (See Comments)    Unknown      Medication List       Accurate as of 12/14/15  2:42 PM. Always use your most recent med list.          amLODipine 10 MG tablet Commonly known as:  NORVASC Take 10 mg by mouth daily.   aspirin 81 MG EC tablet Take 1 tablet (81 mg total) by mouth daily.   atorvastatin 80 MG tablet Commonly known as:  LIPITOR Take 80 mg by mouth daily.   carvedilol 25 MG tablet Commonly known as:  COREG Take 25 mg by mouth 2 (two) times daily with a meal.   donepezil 10 MG tablet Commonly known as:  ARICEPT Take 1 tablet (10 mg total) by mouth at bedtime.   ferrous sulfate 325 (65 FE) MG tablet Take 325 mg by mouth 2 (two) times daily.   folic acid 1 MG tablet Commonly known as:  FOLVITE Take 1 tablet (1 mg total) by mouth daily.   hydrALAZINE 50 MG tablet Commonly known as:  APRESOLINE Take 50 mg by mouth every 8 (eight) hours.   HYDROcodone-acetaminophen 5-325  MG tablet Commonly known as:  NORCO/VICODIN Take 1 tablet by mouth every 6 (six) hours as needed for moderate pain.   insulin glargine 100 UNIT/ML injection Commonly known as:  LANTUS Inject 15 Units into the skin daily.   insulin lispro 100 UNIT/ML injection Commonly known as:  HUMALOG Inject 9 Units into the skin 3 (three) times daily with meals.   levothyroxine 25 MCG tablet Commonly known as:  SYNTHROID, LEVOTHROID Take 1 tablet (25 mcg total) by mouth daily before breakfast.   montelukast 10 MG tablet Commonly known as:  SINGULAIR Take 10 mg by mouth at bedtime.   pantoprazole 40 MG tablet Commonly known as:  PROTONIX Take 1 tablet (40 mg total) by mouth daily.   sodium bicarbonate 650 MG tablet Take 1,300  mg by mouth 2 (two) times daily.   ticagrelor 90 MG Tabs tablet Commonly known as:  BRILINTA Take 1 tablet (90 mg total) by mouth 2 (two) times daily.   torsemide 20 MG tablet Commonly known as:  DEMADEX Take 20 mg by mouth 2 (two) times daily.   vitamin C 250 MG tablet Commonly known as:  ASCORBIC ACID Take 250 mg by mouth 2 (two) times daily.       Review of Systems  Constitutional: Negative for activity change, appetite change, chills, fatigue and fever.  HENT: Negative for congestion, rhinorrhea, sinus pain, sinus pressure, sneezing and sore throat.   Eyes: Negative.   Respiratory: Negative for cough, chest tightness and wheezing.   Gastrointestinal: Negative for abdominal distention, abdominal pain, constipation, diarrhea and vomiting.        Nausea at times.   Endocrine: Negative for polydipsia, polyphagia and polyuria.  Genitourinary: Negative for dysuria, flank pain, frequency and urgency.  Musculoskeletal: Positive for gait problem.  Skin: Negative for color change, pallor and rash.       Right heel and sacral ulcers.   Neurological: Negative for dizziness, seizures, syncope, light-headedness and headaches.  Hematological: Does not bruise/bleed easily.  Psychiatric/Behavioral: Negative for agitation, confusion, hallucinations and sleep disturbance. The patient is not nervous/anxious.     Immunization History  Administered Date(s) Administered  . Influenza,inj,Quad PF,36+ Mos 11/26/2015  . PPD Test 12/04/2015   Pertinent  Health Maintenance Due  Topic Date Due  . FOOT EXAM  07/29/1959  . OPHTHALMOLOGY EXAM  07/29/1959  . URINE MICROALBUMIN  07/29/1959  . COLONOSCOPY  07/29/1999  . PNA vac Low Risk Adult (1 of 2 - PCV13) 07/29/2014  . HEMOGLOBIN A1C  05/16/2016  . INFLUENZA VACCINE  Completed      Vitals:   12/14/15 1400  BP: (!) 154/64  Pulse: 77  Resp: 18  Temp: 97.6 F (36.4 C)  SpO2: 96%  Weight: 210 lb 9.6 oz (95.5 kg)  Height: 5\' 11"  (1.803 m)     Body mass index is 29.37 kg/m. Physical Exam  Constitutional: He is oriented to person, place, and time. He appears well-developed and well-nourished. No distress.  HENT:  Head: Normocephalic.  Mouth/Throat: Oropharynx is clear and moist. No oropharyngeal exudate.  Eyes: Conjunctivae and EOM are normal. Pupils are equal, round, and reactive to light. Right eye exhibits no discharge. Left eye exhibits no discharge. No scleral icterus.  Neck: Normal range of motion. No JVD present. No thyromegaly present.  Cardiovascular: Normal rate, regular rhythm, normal heart sounds and intact distal pulses.  Exam reveals no gallop and no friction rub.   No murmur heard. Pulmonary/Chest: Effort normal and breath sounds normal. No respiratory  distress. He has no wheezes. He has no rales.  Abdominal: Soft. Bowel sounds are normal. He exhibits no distension. There is no tenderness. There is no rebound and no guarding.  Genitourinary:  Genitourinary Comments: Continent   Musculoskeletal: He exhibits no tenderness or deformity.  Unsteady. Moves x 4 extremities. 1+ pitting edema to lower extremities.   Lymphadenopathy:    He has no cervical adenopathy.  Neurological: He is oriented to person, place, and time.  Skin: Skin is warm and dry. No rash noted. No erythema. No pallor.  Right heel and sacral wound drsg dry, clean and intact.  Psychiatric: He has a normal mood and affect.    Labs reviewed:  Recent Labs  11/18/15 0404  11/23/15 0601  11/25/15 0744  11/29/15 [id number] 11/30/15 0503 12/01/15 0509 12/02/15 0636 12/08/15  NA 135  < > 138  < > 135  < > 138 137 137 137 139  K 3.8  < > 3.9  < > 3.9  < > 3.5 3.5 3.4* 4.1 3.8  CL 111  < > 109  < > 106  < > 107 106 105 104  --   CO2 16*  < > 19*  < > 18*  < > 23 21* 22 22  --   GLUCOSE 229*  < > 118*  < > 236*  < > 172* 222* 253* 235*  --   BUN 41*  < > 56*  < > 57*  < > 75* 81* 75* 80* 70*  CREATININE 3.20*  < > 3.63*  < > 3.54*  < > 3.61* 3.69*  3.58* 4.00* 4.5*  CALCIUM 8.5*  < > 8.2*  < > 8.4*  < > 8.4* 8.2* 8.5* 8.4*  --   MG 1.8  --  1.6*  --  1.6*  --   --   --   --   --   --   PHOS 5.1*  < >  --   < > 4.4  < > 4.5 4.5 4.4  --   --   < > = values in this interval not displayed.  Recent Labs  11/15/15 1830 11/15/15 2328  11/23/15 1948  11/29/15 [id number] 11/30/15 0503 12/01/15 0509 12/08/15  AST 23 29  --  16  --   --   --   --  10*  ALT 14* 14*  --  13*  --   --   --   --  9*  ALKPHOS 94 82  --  76  --   --   --   --  107  BILITOT 0.6 0.3  --  0.4  --   --   --   --   --   PROT 7.2 6.9  --  5.7*  --   --   --   --   --   ALBUMIN 1.9* 1.8*  < > 1.6*  < > 1.7* 1.8* 1.7*  --   < > = values in this interval not displayed.  Recent Labs  11/18/15 0404  11/23/15 1948  11/28/15 0813 11/29/15 [id number] 12/02/15 0636 12/08/15  WBC 11.3*  < > 10.4  < > 10.8* 11.2* 12.0* 7.9  NEUTROABS 6.4  --  5.1  --   --   --  5.9  --   HGB 7.7*  < > 7.3*  < > 9.0* 8.9* 8.3* 7.5*  HCT 24.0*  < > 22.4*  < > 28.6* 28.2* 26.3* 24*  MCV 87.0  < > 88.5  < > 91.1 90.7 92.0  --   PLT 337  < > 405*  < > 373 367 320 279  < > = values in this interval not displayed. Lab Results  Component Value Date   TSH 2.954 11/16/2015   Lab Results  Component Value Date   HGBA1C 10.0 (H) 11/16/2015   Lab Results  Component Value Date   CHOL 129 11/16/2015   HDL 38 (L) 11/16/2015   LDLCALC 73 11/16/2015   TRIG 89 11/16/2015   CHOLHDL 3.4 11/16/2015   Assessment/Plan 1. Insomnia, unspecified type Start Melatonin 3 mg Tablet at bedtime. Continue to monitor.   2. Uncontrolled type 2 diabetes mellitus with peripheral neuropathy (HCC) CBG's ranging in the 80's-200's. Has had diminished appetite. Will discontinued current  Humalog then start Humalog per sliding scale three times daily with meals for CBG's < 150 =0 units; 150 -250 = 5 units; 251-300= 8 units; 301-350 = 10 units ; > 350 notify Provider.   3. Poor appetit Encourage oral and fluid intake.  Continue to monitor.     Family/ staff Communication: Reviewed plan of care with patient and facility Nurse supervisor.   Labs/tests ordered:  None

## 2015-12-15 ENCOUNTER — Inpatient Hospital Stay: Payer: PRIVATE HEALTH INSURANCE | Admitting: Hematology and Oncology

## 2015-12-16 ENCOUNTER — Inpatient Hospital Stay (HOSPITAL_COMMUNITY)
Admission: EM | Admit: 2015-12-16 | Discharge: 2015-12-18 | DRG: 378 | Disposition: A | Discharge status: Skilled Nursing Facility | Payer: MEDICARE | Attending: Internal Medicine | Admitting: Internal Medicine

## 2015-12-16 ENCOUNTER — Encounter (HOSPITAL_COMMUNITY): Payer: Self-pay | Admitting: Emergency Medicine

## 2015-12-16 ENCOUNTER — Emergency Department (HOSPITAL_COMMUNITY): Payer: MEDICARE

## 2015-12-16 ENCOUNTER — Telehealth: Payer: Self-pay | Admitting: Hematology and Oncology

## 2015-12-16 ENCOUNTER — Non-Acute Institutional Stay (SKILLED_NURSING_FACILITY): Payer: MEDICARE | Admitting: Family

## 2015-12-16 ENCOUNTER — Inpatient Hospital Stay (HOSPITAL_COMMUNITY): Payer: MEDICARE

## 2015-12-16 DIAGNOSIS — Z7902 Long term (current) use of antithrombotics/antiplatelets: Secondary | ICD-10-CM | POA: Diagnosis not present

## 2015-12-16 DIAGNOSIS — E119 Type 2 diabetes mellitus without complications: Secondary | ICD-10-CM

## 2015-12-16 DIAGNOSIS — Z823 Family history of stroke: Secondary | ICD-10-CM

## 2015-12-16 DIAGNOSIS — N184 Chronic kidney disease, stage 4 (severe): Secondary | ICD-10-CM | POA: Diagnosis present

## 2015-12-16 DIAGNOSIS — E785 Hyperlipidemia, unspecified: Secondary | ICD-10-CM | POA: Diagnosis present

## 2015-12-16 DIAGNOSIS — Z87891 Personal history of nicotine dependence: Secondary | ICD-10-CM | POA: Diagnosis not present

## 2015-12-16 DIAGNOSIS — L89611 Pressure ulcer of right heel, stage 1: Secondary | ICD-10-CM | POA: Diagnosis present

## 2015-12-16 DIAGNOSIS — Z7982 Long term (current) use of aspirin: Secondary | ICD-10-CM

## 2015-12-16 DIAGNOSIS — E872 Acidosis: Secondary | ICD-10-CM | POA: Diagnosis present

## 2015-12-16 DIAGNOSIS — L89621 Pressure ulcer of left heel, stage 1: Secondary | ICD-10-CM | POA: Diagnosis present

## 2015-12-16 DIAGNOSIS — R7989 Other specified abnormal findings of blood chemistry: Secondary | ICD-10-CM

## 2015-12-16 DIAGNOSIS — D329 Benign neoplasm of meninges, unspecified: Secondary | ICD-10-CM | POA: Diagnosis present

## 2015-12-16 DIAGNOSIS — Z794 Long term (current) use of insulin: Secondary | ICD-10-CM | POA: Diagnosis not present

## 2015-12-16 DIAGNOSIS — T502X5A Adverse effect of carbonic-anhydrase inhibitors, benzothiadiazides and other diuretics, initial encounter: Secondary | ICD-10-CM | POA: Diagnosis present

## 2015-12-16 DIAGNOSIS — R195 Other fecal abnormalities: Secondary | ICD-10-CM | POA: Diagnosis present

## 2015-12-16 DIAGNOSIS — L89141 Pressure ulcer of left lower back, stage 1: Secondary | ICD-10-CM | POA: Diagnosis present

## 2015-12-16 DIAGNOSIS — D62 Acute posthemorrhagic anemia: Secondary | ICD-10-CM | POA: Diagnosis present

## 2015-12-16 DIAGNOSIS — F329 Major depressive disorder, single episode, unspecified: Secondary | ICD-10-CM | POA: Diagnosis present

## 2015-12-16 DIAGNOSIS — K219 Gastro-esophageal reflux disease without esophagitis: Secondary | ICD-10-CM | POA: Diagnosis present

## 2015-12-16 DIAGNOSIS — E11621 Type 2 diabetes mellitus with foot ulcer: Secondary | ICD-10-CM | POA: Diagnosis present

## 2015-12-16 DIAGNOSIS — N179 Acute kidney failure, unspecified: Secondary | ICD-10-CM | POA: Diagnosis present

## 2015-12-16 DIAGNOSIS — F0390 Unspecified dementia without behavioral disturbance: Secondary | ICD-10-CM | POA: Diagnosis present

## 2015-12-16 DIAGNOSIS — K253 Acute gastric ulcer without hemorrhage or perforation: Secondary | ICD-10-CM | POA: Diagnosis not present

## 2015-12-16 DIAGNOSIS — D638 Anemia in other chronic diseases classified elsewhere: Secondary | ICD-10-CM | POA: Diagnosis present

## 2015-12-16 DIAGNOSIS — Z833 Family history of diabetes mellitus: Secondary | ICD-10-CM

## 2015-12-16 DIAGNOSIS — IMO0001 Reserved for inherently not codable concepts without codable children: Secondary | ICD-10-CM

## 2015-12-16 DIAGNOSIS — E039 Hypothyroidism, unspecified: Secondary | ICD-10-CM | POA: Diagnosis present

## 2015-12-16 DIAGNOSIS — I252 Old myocardial infarction: Secondary | ICD-10-CM

## 2015-12-16 DIAGNOSIS — L89131 Pressure ulcer of right lower back, stage 1: Secondary | ICD-10-CM | POA: Diagnosis present

## 2015-12-16 DIAGNOSIS — K921 Melena: Secondary | ICD-10-CM | POA: Diagnosis not present

## 2015-12-16 DIAGNOSIS — E1122 Type 2 diabetes mellitus with diabetic chronic kidney disease: Secondary | ICD-10-CM | POA: Diagnosis present

## 2015-12-16 DIAGNOSIS — Z79899 Other long term (current) drug therapy: Secondary | ICD-10-CM

## 2015-12-16 DIAGNOSIS — K922 Gastrointestinal hemorrhage, unspecified: Secondary | ICD-10-CM | POA: Diagnosis present

## 2015-12-16 DIAGNOSIS — I2510 Atherosclerotic heart disease of native coronary artery without angina pectoris: Secondary | ICD-10-CM | POA: Diagnosis present

## 2015-12-16 DIAGNOSIS — Z8673 Personal history of transient ischemic attack (TIA), and cerebral infarction without residual deficits: Secondary | ICD-10-CM

## 2015-12-16 DIAGNOSIS — S32592D Other specified fracture of left pubis, subsequent encounter for fracture with routine healing: Secondary | ICD-10-CM

## 2015-12-16 DIAGNOSIS — K250 Acute gastric ulcer with hemorrhage: Secondary | ICD-10-CM | POA: Diagnosis present

## 2015-12-16 DIAGNOSIS — Z8249 Family history of ischemic heart disease and other diseases of the circulatory system: Secondary | ICD-10-CM

## 2015-12-16 DIAGNOSIS — F0150 Vascular dementia without behavioral disturbance: Secondary | ICD-10-CM

## 2015-12-16 DIAGNOSIS — Z9049 Acquired absence of other specified parts of digestive tract: Secondary | ICD-10-CM

## 2015-12-16 DIAGNOSIS — I129 Hypertensive chronic kidney disease with stage 1 through stage 4 chronic kidney disease, or unspecified chronic kidney disease: Secondary | ICD-10-CM | POA: Diagnosis present

## 2015-12-16 DIAGNOSIS — D509 Iron deficiency anemia, unspecified: Secondary | ICD-10-CM | POA: Diagnosis not present

## 2015-12-16 DIAGNOSIS — N400 Enlarged prostate without lower urinary tract symptoms: Secondary | ICD-10-CM | POA: Diagnosis present

## 2015-12-16 LAB — URINALYSIS, ROUTINE W REFLEX MICROSCOPIC
Bilirubin Urine: NEGATIVE
GLUCOSE, UA: 100 mg/dL — AB
Hgb urine dipstick: NEGATIVE
KETONES UR: NEGATIVE mg/dL
LEUKOCYTES UA: NEGATIVE
NITRITE: NEGATIVE
Specific Gravity, Urine: 1.014 (ref 1.005–1.030)
pH: 6.5 (ref 5.0–8.0)

## 2015-12-16 LAB — CBC WITH DIFFERENTIAL/PLATELET
BASOS PCT: 0 %
Basophils Absolute: 0 10*3/uL (ref 0.0–0.1)
EOS PCT: 3 %
Eosinophils Absolute: 0.3 10*3/uL (ref 0.0–0.7)
HEMATOCRIT: 21.3 % — AB (ref 39.0–52.0)
Hemoglobin: 6.7 g/dL — CL (ref 13.0–17.0)
Lymphocytes Relative: 40 %
Lymphs Abs: 3.3 10*3/uL (ref 0.7–4.0)
MCH: 28.6 pg (ref 26.0–34.0)
MCHC: 31.5 g/dL (ref 30.0–36.0)
MCV: 91 fL (ref 78.0–100.0)
MONOS PCT: 18 %
Monocytes Absolute: 1.5 10*3/uL — ABNORMAL HIGH (ref 0.1–1.0)
Neutro Abs: 3.2 10*3/uL (ref 1.7–7.7)
Neutrophils Relative %: 39 %
Platelets: 349 10*3/uL (ref 150–400)
RBC: 2.34 MIL/uL — AB (ref 4.22–5.81)
RDW: 15.6 % — ABNORMAL HIGH (ref 11.5–15.5)
WBC: 8.3 10*3/uL (ref 4.0–10.5)

## 2015-12-16 LAB — GLUCOSE, CAPILLARY
Glucose-Capillary: 106 mg/dL — ABNORMAL HIGH (ref 65–99)
Glucose-Capillary: 106 mg/dL — ABNORMAL HIGH (ref 65–99)

## 2015-12-16 LAB — CBC AND DIFFERENTIAL
HCT: 20 % — AB (ref 41–53)
Hemoglobin: 6.2 g/dL — AB (ref 13.5–17.5)
Platelets: 289 10*3/uL (ref 150–399)
WBC: 6.8 10^3/mL

## 2015-12-16 LAB — BASIC METABOLIC PANEL
ANION GAP: 9 (ref 5–15)
BUN: 57 mg/dL — AB (ref 4–21)
BUN: 62 mg/dL — AB (ref 6–20)
CO2: 24 mmol/L (ref 22–32)
Calcium: 8.2 mg/dL — ABNORMAL LOW (ref 8.9–10.3)
Chloride: 105 mmol/L (ref 101–111)
Creatinine, Ser: 4.54 mg/dL — ABNORMAL HIGH (ref 0.61–1.24)
Creatinine: 4.2 mg/dL — AB (ref 0.6–1.3)
GFR calc Af Amer: 14 mL/min — ABNORMAL LOW (ref >60)
GFR calc non Af Amer: 12 mL/min — ABNORMAL LOW (ref >60)
GLUCOSE: 186 mg/dL
GLUCOSE: 156 mg/dL — AB (ref 65–99)
POTASSIUM: 4 mmol/L (ref 3.4–5.3)
POTASSIUM: 3.7 mmol/L (ref 3.5–5.1)
SODIUM: 141 mmol/L (ref 137–147)
Sodium: 138 mmol/L (ref 135–145)

## 2015-12-16 LAB — POC OCCULT BLOOD, ED: Fecal Occult Bld: POSITIVE — AB

## 2015-12-16 LAB — ABO/RH: ABO/RH(D): A POS

## 2015-12-16 LAB — HEPATIC FUNCTION PANEL
ALK PHOS: 91 U/L (ref 38–126)
ALT: 13 U/L — AB (ref 17–63)
AST: 15 U/L (ref 15–41)
Albumin: 2.1 g/dL — ABNORMAL LOW (ref 3.5–5.0)
BILIRUBIN TOTAL: 0.8 mg/dL (ref 0.3–1.2)
Total Protein: 6.6 g/dL (ref 6.5–8.1)

## 2015-12-16 LAB — URINE MICROSCOPIC-ADD ON
Bacteria, UA: NONE SEEN
RBC / HPF: NONE SEEN RBC/hpf (ref 0–5)
SQUAMOUS EPITHELIAL / LPF: NONE SEEN

## 2015-12-16 LAB — PREPARE RBC (CROSSMATCH)

## 2015-12-16 LAB — PROTIME-INR
INR: 1.37
Prothrombin Time: 17 seconds — ABNORMAL HIGH (ref 11.4–15.2)

## 2015-12-16 MED ORDER — INSULIN ASPART 100 UNIT/ML ~~LOC~~ SOLN
0.0000 [IU] | SUBCUTANEOUS | Status: DC
  Administered 2015-12-17: 1 [IU] via SUBCUTANEOUS
  Administered 2015-12-17: 2 [IU] via SUBCUTANEOUS
  Administered 2015-12-18 (×2): 1 [IU] via SUBCUTANEOUS

## 2015-12-16 MED ORDER — SODIUM CHLORIDE 0.9 % IV SOLN
8.0000 mg/h | INTRAVENOUS | Status: DC
  Administered 2015-12-16 – 2015-12-18 (×4): 8 mg/h via INTRAVENOUS
  Filled 2015-12-16 (×8): qty 80

## 2015-12-16 MED ORDER — FERROUS SULFATE 325 (65 FE) MG PO TABS
325.0000 mg | ORAL_TABLET | Freq: Two times a day (BID) | ORAL | Status: DC
  Administered 2015-12-17 – 2015-12-18 (×3): 325 mg via ORAL
  Filled 2015-12-16 (×3): qty 1

## 2015-12-16 MED ORDER — SODIUM CHLORIDE 0.9 % IV SOLN
Freq: Once | INTRAVENOUS | Status: AC
  Administered 2015-12-17: 12:00:00 via INTRAVENOUS

## 2015-12-16 MED ORDER — ONDANSETRON HCL 4 MG/2ML IJ SOLN
4.0000 mg | Freq: Four times a day (QID) | INTRAMUSCULAR | Status: DC | PRN
  Administered 2015-12-16: 4 mg via INTRAVENOUS
  Filled 2015-12-16: qty 2

## 2015-12-16 MED ORDER — CARVEDILOL 25 MG PO TABS
25.0000 mg | ORAL_TABLET | Freq: Two times a day (BID) | ORAL | Status: DC
  Administered 2015-12-18: 25 mg via ORAL
  Filled 2015-12-16: qty 1

## 2015-12-16 MED ORDER — FOLIC ACID 1 MG PO TABS
1.0000 mg | ORAL_TABLET | Freq: Every day | ORAL | Status: DC
  Administered 2015-12-17 – 2015-12-18 (×2): 1 mg via ORAL
  Filled 2015-12-16 (×2): qty 1

## 2015-12-16 MED ORDER — MONTELUKAST SODIUM 10 MG PO TABS
10.0000 mg | ORAL_TABLET | Freq: Every day | ORAL | Status: DC
  Administered 2015-12-17: 10 mg via ORAL
  Filled 2015-12-16: qty 1

## 2015-12-16 MED ORDER — PANTOPRAZOLE SODIUM 40 MG IV SOLR: 40.0000 mg | Freq: Two times a day (BID) | INTRAVENOUS | Status: DC

## 2015-12-16 MED ORDER — ATORVASTATIN CALCIUM 40 MG PO TABS
80.0000 mg | ORAL_TABLET | Freq: Every day | ORAL | Status: DC
  Administered 2015-12-17 – 2015-12-18 (×2): 80 mg via ORAL
  Filled 2015-12-16 (×2): qty 2

## 2015-12-16 MED ORDER — SODIUM CHLORIDE 0.9% FLUSH
3.0000 mL | Freq: Two times a day (BID) | INTRAVENOUS | Status: DC
  Administered 2015-12-16 – 2015-12-18 (×4): 3 mL via INTRAVENOUS

## 2015-12-16 MED ORDER — SODIUM BICARBONATE 650 MG PO TABS
1300.0000 mg | ORAL_TABLET | Freq: Two times a day (BID) | ORAL | Status: DC
  Administered 2015-12-17 – 2015-12-18 (×3): 1300 mg via ORAL
  Filled 2015-12-16 (×5): qty 2

## 2015-12-16 MED ORDER — HYDRALAZINE HCL 20 MG/ML IJ SOLN: 10.0000 mg | Freq: Four times a day (QID) | INTRAMUSCULAR | Status: DC | PRN

## 2015-12-16 MED ORDER — SODIUM CHLORIDE 0.9 % IV SOLN
80.0000 mg | Freq: Once | INTRAVENOUS | Status: AC
  Administered 2015-12-16: 80 mg via INTRAVENOUS
  Filled 2015-12-16: qty 80

## 2015-12-16 MED ORDER — LEVOTHYROXINE SODIUM 25 MCG PO TABS
25.0000 ug | ORAL_TABLET | Freq: Every day | ORAL | Status: DC
  Administered 2015-12-17 – 2015-12-18 (×2): 25 ug via ORAL
  Filled 2015-12-16 (×2): qty 1

## 2015-12-16 MED ORDER — DONEPEZIL HCL 10 MG PO TABS
10.0000 mg | ORAL_TABLET | Freq: Every day | ORAL | Status: DC
  Administered 2015-12-17: 10 mg via ORAL
  Filled 2015-12-16 (×2): qty 1

## 2015-12-16 NOTE — ED Notes (Signed)
Maylon Cos, RN attempted IV ultrasound x2, Unsuccessful. IV team consult placed.

## 2015-12-16 NOTE — Progress Notes (Signed)
Location:  [facility] Room Number: 105 Place of Service:  SNF [id number]) Provider:  Tiawanna Luchsinger FNP-C   Kym Groom Guy Begin, MD  Patient Care Team: [name], MD as PCP - General [facility] Medicine)  Extended Emergency Contact Information Primary Emergency Contact: [facility] Address: [address]          [place], [place] [address] [place] Phone: [phone] Mobile Phone: [phone] Relation: Significant other  Code Status: Full Code  Goals of care: Advanced Directive information Advanced Directives 11/23/2015  Does Patient Have a Medical Advance Directive? Yes  Type of Advance Directive [place]  Does patient want to make changes to medical advance directive? -  Copy of [place] in Chart? -     Chief Complaint  Patient presents with  . Acute Visit    abnormal lab results     HPI:  Pt is a 65 y.o. male seen today at [facility]  for an acute visit for evaluation of abnormal lab results. He has a medical history of HTN, Type 2 DM, CKD stage 4, BPH, Hypothyroidism, Depression among other conditions. He is seen in his room today. He states has loose dark stool. Facility Nurse reports stool [place] positive for blood. His stat CBC results showed Hgb 6.2, HCT 19 ( 12/16/2015).  He has worked well with PT/OT for ROM, exercise, gait stability and muscle strengthening. Of note he was here for short term rehabilitation post hospital admission from 11/15/15-11/23/15 and inpatient rehabilitation from 11/23/15-12/04/15 for Diabetic ketoacidosis and acute on chronic renal failure.He was also seen by neurosurgery for meningioma concern.Obstructive renal etiology was ruled out. He was seen by nephrology, IR and urology for right kidney cystic lesion. He received 1 unit of  PRBC for anemia. MRI C-spine showed cervical spondylosis with canal narrowing C4-5 through C7-T1. He  denies any headache, dizziness or shortness of breath.Discussed with patient plan to send to ER for evaluation of possible GI bleed . Patient agrees and verbalized understanding. He request to come back for Rehab post hospital visit.  Past Medical History:  Diagnosis Date  . BPH (benign prostatic hyperplasia)   . Chronic kidney disease   . Depression   . Diabetes mellitus without complication ([place])   . GERD (gastroesophageal reflux disease)   . Hyperlipidemia   . Hypertension   . Hypothyroidism   . TIA (transient ischemic attack)    Past Surgical History:  Procedure Laterality Date  . APPENDECTOMY    . CHOLECYSTECTOMY    . CYSTOURETHROSCOPY    . POLYPECTOMY     Vocal cord polyp removed  . VASECTOMY      Allergies  Allergen Reactions  . Penicillins Other (See Comments)    Unknown  . Tetracyclines & Related Other (See Comments)    Unknown      Medication List       Accurate as of 12/16/15  2:29 PM. Always use your most recent med list.          amLODipine 10 MG tablet Commonly known as:  NORVASC Take 10 mg by mouth daily.   aspirin 81 MG EC tablet Take 1 tablet (81 mg total) by mouth daily.   atorvastatin 80 MG tablet Commonly known as:  LIPITOR Take 80 mg by mouth daily.   carvedilol 25 MG tablet Commonly known as:  COREG Take 25 mg by mouth 2 (two) times daily with  a meal.   donepezil 10 MG tablet Commonly known as:  ARICEPT Take 1 tablet (10 mg total) by mouth at bedtime.   ferrous sulfate 325 (65 FE) MG tablet Take 325 mg by mouth 2 (two) times daily.   folic acid 1 MG tablet Commonly known as:  FOLVITE Take 1 tablet (1 mg total) by mouth daily.   hydrALAZINE 50 MG tablet Commonly known as:  APRESOLINE Take 50 mg by mouth every 8 (eight) hours.   HYDROcodone-acetaminophen 5-325 MG tablet Commonly known as:  NORCO/VICODIN Take 1 tablet by mouth every 6 (six) hours as needed for moderate pain.   insulin glargine 100 UNIT/ML  injection Commonly known as:  LANTUS Inject 15 Units into the skin daily.   insulin lispro 100 UNIT/ML injection Commonly known as:  HUMALOG Inject 0.05-0.1 mLs (5-10 Units total) into the skin 3 (three) times daily before meals. Per sliding scale < 150 = 0 units; 150-250 = 5 units; 251-300 = 8 units; 301-350 = 10 units and > 350 notify provider.   levothyroxine 25 MCG tablet Commonly known as:  SYNTHROID, LEVOTHROID Take 1 tablet (25 mcg total) by mouth daily before breakfast.   Melatonin 3 MG Tabs Take 1 tablet (3 mg total) by mouth at bedtime.   montelukast 10 MG tablet Commonly known as:  SINGULAIR Take 10 mg by mouth at bedtime.   pantoprazole 40 MG tablet Commonly known as:  PROTONIX Take 1 tablet (40 mg total) by mouth daily.   sodium bicarbonate 650 MG tablet Take 1,300 mg by mouth 2 (two) times daily.   ticagrelor 90 MG Tabs tablet Commonly known as:  BRILINTA Take 1 tablet (90 mg total) by mouth 2 (two) times daily.   torsemide 20 MG tablet Commonly known as:  DEMADEX Take 20 mg by mouth 2 (two) times daily.   vitamin C 250 MG tablet Commonly known as:  ASCORBIC ACID Take 250 mg by mouth 2 (two) times daily.       Review of Systems  Constitutional: Negative for activity change, appetite change, chills, fatigue and fever.  HENT: Negative for congestion, rhinorrhea, sinus pain, sinus pressure, sneezing and sore throat.   Eyes: Negative.   Respiratory: Negative for cough, chest tightness and wheezing.   Gastrointestinal: Negative for abdominal distention, abdominal pain, constipation and vomiting.        Loose dark stool   Endocrine: Negative for polydipsia, polyphagia and polyuria.  Genitourinary: Negative for dysuria, flank pain, frequency and urgency.  Musculoskeletal: Positive for gait problem.  Skin: Negative for color change, pallor and rash.       Right heel and sacral ulcers.   Neurological: Negative for dizziness, seizures, syncope,  light-headedness and headaches.  Hematological: Does not bruise/bleed easily.  Psychiatric/Behavioral: Negative for agitation, confusion, hallucinations and sleep disturbance. The patient is not nervous/anxious.     Immunization History  Administered Date(s) Administered  . Influenza,inj,Quad PF,36+ Mos 11/26/2015  . PPD Test 12/04/2015   Pertinent  Health Maintenance Due  Topic Date Due  . FOOT EXAM  07/29/1959  . OPHTHALMOLOGY EXAM  07/29/1959  . URINE MICROALBUMIN  07/29/1959  . COLONOSCOPY  07/29/1999  . PNA vac Low Risk Adult (1 of 2 - PCV13) 07/29/2014  . HEMOGLOBIN A1C  05/16/2016  . INFLUENZA VACCINE  Completed      Vitals:   12/16/15 1418  BP: (!) 149/58  Pulse: (!) 102  Resp: 18  Temp: 98.4 F (36.9 C)  SpO2: 97%  Weight: 210  lb 9.6 oz (95.5 kg)  Height: 5\' 11"  (1.803 m)   Body mass index is 29.37 kg/m. Physical Exam  Constitutional: He is oriented to person, place, and time. He appears well-developed and well-nourished. No distress.  HENT:  Head: Normocephalic.  Mouth/Throat: Oropharynx is clear and moist. No oropharyngeal exudate.  Eyes: Conjunctivae and EOM are normal. Pupils are equal, round, and reactive to light. Right eye exhibits no discharge. Left eye exhibits no discharge. No scleral icterus.  Neck: Normal range of motion. No JVD present. No thyromegaly present.  Cardiovascular: Normal rate, regular rhythm, normal heart sounds and intact distal pulses.  Exam reveals no gallop and no friction rub.   No murmur heard. Pulmonary/Chest: Effort normal and breath sounds normal. No respiratory distress. He has no wheezes. He has no rales.  Abdominal: Soft. Bowel sounds are normal. He exhibits no distension. There is no tenderness. There is no rebound and no guarding.  Genitourinary:  Genitourinary Comments: Continent   Musculoskeletal: He exhibits no tenderness or deformity.  Unsteady. 1+ pitting edema to lower extremities.   Lymphadenopathy:    He has  no cervical adenopathy.  Neurological: He is oriented to person, place, and time.  Skin: Skin is warm and dry. No rash noted. No erythema. No pallor.  Right heel and sacral wound drsg dry, clean and intact.  Psychiatric: He has a normal mood and affect.    Labs reviewed:  Recent Labs  11/18/15 0404  11/23/15 0601  11/25/15 0744  11/29/15 [id number] 11/30/15 0503 12/01/15 0509 12/02/15 0636 12/08/15  NA 135  < > 138  < > 135  < > 138 137 137 137 139  K 3.8  < > 3.9  < > 3.9  < > 3.5 3.5 3.4* 4.1 3.8  CL 111  < > 109  < > 106  < > 107 106 105 104  --   CO2 16*  < > 19*  < > 18*  < > 23 21* 22 22  --   GLUCOSE 229*  < > 118*  < > 236*  < > 172* 222* 253* 235*  --   BUN 41*  < > 56*  < > 57*  < > 75* 81* 75* 80* 70*  CREATININE 3.20*  < > 3.63*  < > 3.54*  < > 3.61* 3.69* 3.58* 4.00* 4.5*  CALCIUM 8.5*  < > 8.2*  < > 8.4*  < > 8.4* 8.2* 8.5* 8.4*  --   MG 1.8  --  1.6*  --  1.6*  --   --   --   --   --   --   PHOS 5.1*  < >  --   < > 4.4  < > 4.5 4.5 4.4  --   --   < > = values in this interval not displayed.  Recent Labs  11/15/15 1830 11/15/15 2328  11/23/15 1948  11/29/15 [id number] 11/30/15 0503 12/01/15 0509 12/08/15  AST 23 29  --  16  --   --   --   --  10*  ALT 14* 14*  --  13*  --   --   --   --  9*  ALKPHOS 94 82  --  76  --   --   --   --  107  BILITOT 0.6 0.3  --  0.4  --   --   --   --   --   PROT 7.2 6.9  --  5.7*  --   --   --   --   --   ALBUMIN 1.9* 1.8*  < > 1.6*  < > 1.7* 1.8* 1.7*  --   < > = values in this interval not displayed.  Recent Labs  11/18/15 0404  11/23/15 1948  11/28/15 0813 11/29/15 [id number] 12/02/15 0636 12/08/15  WBC 11.3*  < > 10.4  < > 10.8* 11.2* 12.0* 7.9  NEUTROABS 6.4  --  5.1  --   --   --  5.9  --   HGB 7.7*  < > 7.3*  < > 9.0* 8.9* 8.3* 7.5*  HCT 24.0*  < > 22.4*  < > 28.6* 28.2* 26.3* 24*  MCV 87.0  < > 88.5  < > 91.1 90.7 92.0  --   PLT 337  < > 405*  < > 373 367 320 279  < > = values in this interval not displayed. Lab Results   Component Value Date   TSH 2.954 11/16/2015   Lab Results  Component Value Date   HGBA1C 10.0 (H) 11/16/2015   Lab Results  Component Value Date   CHOL 129 11/16/2015   HDL 38 (L) 11/16/2015   LDLCALC 73 11/16/2015   TRIG 89 11/16/2015   CHOLHDL 3.4 11/16/2015   Assessment/Plan 1. Iron deficiency anemia, unspecified iron deficiency anemia type Hgb 6.2, HCT 19 ( 12/16/2015) previous Hgb 7.5 ( 12/08/2015. Will Send to ER for evaluation for possible GI bleed. Stool guaiac positive.   2. Stool guaiac positive Reports loose dark stool. Send to ER for evaluation for possible GI bleed.    Family/ staff Communication: Reviewed plan of care with patient and facility Nurse supervisor.    Labs/tests ordered: None send to ER

## 2015-12-16 NOTE — ED Provider Notes (Signed)
I spoke with Dr Fuller Plan, GI, who agrees with plan. His team will follow up in the morning. He requests a call if the patients status changes   Dillon Schmidt, MD 12/16/15 [phone]

## 2015-12-16 NOTE — ED Provider Notes (Signed)
[facility] DEPT Provider Note   CSN: [id number] Arrival date & time: 12/16/15  1509     History   Chief Complaint Chief Complaint  Patient presents with  . Rectal Bleeding    pt low hgb    HPI Dillon Thomas is a 65 y.o. male.  HPI Patient presents to emergency department with complaints of 4-5 days of tarry stool and concern for falling blood counts.  He is currently in a rehabilitation center where he is [place] after an 11 day stay for diabetic ketoacidosis.  He also had a recent stroke in October 2017.  He is on Brilinta.  He reports many years ago he had a lower GI bleed when he lived in [place].  His never had an ulcer or an upper GI bleed.  He feels nauseated at this time.  He has not vomited.  He reports mild exertional shortness of breath at this time without cough.   Past Medical History:  Diagnosis Date  . BPH (benign prostatic hyperplasia)   . Chronic kidney disease   . Depression   . Diabetes mellitus without complication ([place])   . GERD (gastroesophageal reflux disease)   . Hyperlipidemia   . Hypertension   . Hypothyroidism   . TIA (transient ischemic attack)     Patient Active Problem List   Diagnosis Date Noted  . Hypokalemia   . Fracture   . Elevated serum immunoglobulin free light chains   . Renovascular hypertension   . Closed fracture of multiple pubic rami, left, initial encounter ([place])   . Fluid overload   . DM type 2 with diabetic peripheral neuropathy ([place])   . Left sided chest pain   . Sleep disturbance   . Hypoalbuminemia due to protein-calorie malnutrition ([place])   . Pleural effusion   . Essential hypertension   . Poorly controlled type 2 diabetes mellitus with peripheral neuropathy ([place])   . Rib pain on left side   . Debility 11/23/2015  . Debilitated 11/23/2015  . Anemia of chronic disease   . Renal mass   . Hyperlipidemia   . Acute blood loss anemia   . Benign essential HTN   . Diabetic ketoacidosis with coma associated  with type 2 diabetes mellitus ([place])   . Hemangioma   . History of TIA (transient ischemic attack)   . Hyperglycemia   . Hypothyroidism   . Lymphocytosis   . NSTEMI (non-ST elevated myocardial infarction) ([place])   . Spondylosis of cervical region without myelopathy or radiculopathy   . Uncontrolled type 2 diabetes mellitus with peripheral neuropathy ([place])   . Skin tear of left elbow without complication   . Pressure injury of skin 11/16/2015  . Obtunded   . Right sided weakness   . Altered mental status 11/15/2015  . Acute metabolic encephalopathy [id number]  . Chronic kidney disease (CKD), stage IV (severe) ([place]) 11/15/2015  . Anemia 11/15/2015  . Hyponatremia 11/15/2015  . Hyperosmolar (nonketotic) coma ([place]) 11/15/2015  . Elevated troponin 11/15/2015  . Tachycardia 11/15/2015  . Accelerated hypertension 11/15/2015    Past Surgical History:  Procedure Laterality Date  . APPENDECTOMY    . CHOLECYSTECTOMY    . CYSTOURETHROSCOPY    . POLYPECTOMY     Vocal cord polyp removed  . VASECTOMY         Home Medications    Prior to Admission medications   Medication Sig Start Date End Date Taking? Authorizing Provider  amLODipine (NORVASC) 10 MG tablet Take 10 mg  by mouth daily.    Historical Provider, MD  aspirin EC 81 MG EC tablet Take 1 tablet (81 mg total) by mouth daily. 11/24/15   Maryann Mikhail, DO  atorvastatin (LIPITOR) 80 MG tablet Take 80 mg by mouth daily.    Historical Provider, MD  carvedilol (COREG) 25 MG tablet Take 25 mg by mouth 2 (two) times daily with a meal.    Historical Provider, MD  donepezil (ARICEPT) 10 MG tablet Take 1 tablet (10 mg total) by mouth at bedtime. 12/03/15   Lavon Paganini Angiulli, PA-C  ferrous sulfate 325 (65 FE) MG tablet Take 325 mg by mouth 2 (two) times daily.    Historical Provider, MD  folic acid (FOLVITE) 1 MG tablet Take 1 tablet (1 mg total) by mouth daily. 11/24/15   Maryann Mikhail, DO  hydrALAZINE (APRESOLINE) 50 MG tablet Take 50 mg  by mouth every 8 (eight) hours.    Historical Provider, MD  HYDROcodone-acetaminophen (NORCO/VICODIN) 5-325 MG tablet Take 1 tablet by mouth every 6 (six) hours as needed for moderate pain. 12/03/15   Lavon Paganini Angiulli, PA-C  insulin glargine (LANTUS) 100 UNIT/ML injection Inject 15 Units into the skin daily.    Historical Provider, MD  insulin lispro (HUMALOG) 100 UNIT/ML injection Inject 0.05-0.1 mLs (5-10 Units total) into the skin 3 (three) times daily before meals. Per sliding scale < 150 = 0 units; 150-250 = 5 units; 251-300 = 8 units; 301-350 = 10 units and > 350 notify provider. 12/14/15   Dinah C Ngetich, NP  levothyroxine (SYNTHROID, LEVOTHROID) 25 MCG tablet Take 1 tablet (25 mcg total) by mouth daily before breakfast. 11/24/15   Maryann Mikhail, DO  Melatonin 3 MG TABS Take 1 tablet (3 mg total) by mouth at bedtime. 12/14/15   Dinah C Ngetich, NP  montelukast (SINGULAIR) 10 MG tablet Take 10 mg by mouth at bedtime.    Historical Provider, MD  pantoprazole (PROTONIX) 40 MG tablet Take 1 tablet (40 mg total) by mouth daily. 11/24/15   Maryann Mikhail, DO  sodium bicarbonate 650 MG tablet Take 1,300 mg by mouth 2 (two) times daily.    Historical Provider, MD  ticagrelor (BRILINTA) 90 MG TABS tablet Take 1 tablet (90 mg total) by mouth 2 (two) times daily. 11/23/15   Maryann Mikhail, DO  torsemide (DEMADEX) 20 MG tablet Take 20 mg by mouth 2 (two) times daily.    Historical Provider, MD  vitamin C (ASCORBIC ACID) 250 MG tablet Take 250 mg by mouth 2 (two) times daily.    Historical Provider, MD    Family History Family History  Problem Relation Age of Onset  . CAD Mother   . Diabetes Mother   . Hyperlipidemia Mother   . Hypertension Mother   . CVA Mother   . Alcohol abuse Father   . Diabetes Father   . Hyperlipidemia Father     Social History Social History  Substance Use Topics  . Smoking status: Former Smoker    Quit date: 03/31/2012  . Smokeless tobacco: Never Used  . Alcohol  use No     Allergies   Penicillins and Tetracyclines & related   Review of Systems Review of Systems  All other systems reviewed and are negative.    Physical Exam Updated Vital Signs BP 139/65 (BP Location: Left Arm)   Pulse 62   Temp 97.4 F (36.3 C) (Oral)   Resp 17   Ht 5\' 11"  (1.803 m)   Wt 210 lb (95.3  kg)   SpO2 96%   BMI 29.29 kg/m   Physical Exam  Constitutional: He is oriented to person, place, and time. He appears well-developed and well-nourished.  HENT:  Head: Normocephalic and atraumatic.  Eyes: EOM are normal.  Neck: Normal range of motion.  Cardiovascular: Normal rate, regular rhythm, normal heart sounds and intact distal pulses.   Pulmonary/Chest: Effort normal and breath sounds normal. No respiratory distress.  Abdominal: Soft. He exhibits no distension. There is no tenderness.  Genitourinary:  Genitourinary Comments: Black stool on examination.  Grossly Hemoccult positive  Musculoskeletal: Normal range of motion.  Neurological: He is alert and oriented to person, place, and time.  Skin: Skin is warm and dry. There is pallor.  Psychiatric: He has a normal mood and affect. Judgment normal.  Nursing note and vitals reviewed.    ED Treatments / Results  Labs (all labs ordered are listed, but only abnormal results are displayed) Labs Reviewed  CBC WITH DIFFERENTIAL/PLATELET - Abnormal; Notable for the following:       Result Value   RBC 2.34 (*)    Hemoglobin 6.7 (*)    HCT 21.3 (*)    RDW 15.6 (*)    All other components within normal limits  BASIC METABOLIC PANEL - Abnormal; Notable for the following:    Glucose, Bld 156 (*)    BUN 62 (*)    Creatinine, Ser 4.54 (*)    Calcium 8.2 (*)    GFR calc non Af Amer 12 (*)    GFR calc Af Amer 14 (*)    All other components within normal limits  PROTIME-INR - Abnormal; Notable for the following:    Prothrombin Time 17.0 (*)    All other components within normal limits  HEPATIC FUNCTION PANEL  - Abnormal; Notable for the following:    Albumin 2.1 (*)    ALT 13 (*)    Bilirubin, Direct <0.1 (*)    All other components within normal limits  POC OCCULT BLOOD, ED  TYPE AND SCREEN  ABO/RH  PREPARE RBC (CROSSMATCH)   Hemoglobin  Date Value Ref Range Status  12/16/2015 6.7 (LL) 13.0 - 17.0 g/dL Final    Comment:    REPEATED TO VERIFY CRITICAL RESULT CALLED TO, READ BACK BY AND VERIFIED WITH: LEONARD,S. AT 17:09 12/16/15 BY THOMPSON,N.   12/08/2015 7.5 (A) 13.5 - 17.5 g/dL Final  12/02/2015 8.3 (L) 13.0 - 17.0 g/dL Final  11/29/2015 8.9 (L) 13.0 - 17.0 g/dL Final   HGB  Date Value Ref Range Status  01/09/2013 12.6 (L) 13.0 - 18.0 g/dL Final   BUN  Date Value Ref Range Status  12/16/2015 62 (H) 6 - 20 mg/dL Final  12/08/2015 70 (A) 4 - 21 mg/dL Final  12/02/2015 80 (H) 6 - 20 mg/dL Final  12/01/2015 75 (H) 6 - 20 mg/dL Final  11/30/2015 81 (H) 6 - 20 mg/dL Final  01/11/2013 27 (H) 7 - 18 mg/dL Final  01/10/2013 36 (H) 7 - 18 mg/dL Final  01/09/2013 45 (H) 7 - 18 mg/dL Final   Creatinine  Date Value Ref Range Status  12/08/2015 4.5 (A) 0.6 - 1.3 mg/dL Final  01/11/2013 1.51 (H) 0.60 - 1.30 mg/dL Final  01/10/2013 1.98 (H) 0.60 - 1.30 mg/dL Final  01/09/2013 2.36 (H) 0.60 - 1.30 mg/dL Final   Creatinine, Ser  Date Value Ref Range Status  12/16/2015 4.54 (H) 0.61 - 1.24 mg/dL Final  12/02/2015 4.00 (H) 0.61 - 1.24 mg/dL Final  12/01/2015 3.58 (H) 0.61 - 1.24 mg/dL Final  11/30/2015 3.69 (H) 0.61 - 1.24 mg/dL Final      EKG  EKG Interpretation None       Radiology No results found.  Procedures Procedures (including critical care time)   ++++++++++++++++++++++++++++++++++++++++++++++++++++  CRITICAL CARE Performed by: Hoy Morn Total critical care time: 33 minutes Critical care time was exclusive of separately billable procedures and treating other patients. Critical care was necessary to treat or prevent imminent or life-threatening  deterioration. Critical care was time spent personally by me on the following activities: development of treatment plan with patient and/or surrogate as well as nursing, discussions with consultants, evaluation of patient's response to treatment, examination of patient, obtaining history from patient or surrogate, ordering and performing treatments and interventions, ordering and review of laboratory studies, ordering and review of radiographic studies, pulse oximetry and re-evaluation of patient's condition.   ++++++++++++++++++++++++++++++++++++++++++   Medications Ordered in ED Medications  pantoprazole (PROTONIX) 80 mg in sodium chloride 0.9 % 100 mL IVPB (not administered)  pantoprazole (PROTONIX) 80 mg in sodium chloride 0.9 % 250 mL (0.32 mg/mL) infusion (not administered)  pantoprazole (PROTONIX) injection 40 mg (not administered)  0.9 %  sodium chloride infusion (not administered)     Initial Impression / Assessment and Plan / ED Course  I have reviewed the triage vital signs and the nursing notes.  Pertinent labs & imaging results that were available during my care of the patient were reviewed by me and considered in my medical decision making (see chart for details).  Clinical Course     Patient would likely upper GI bleed given nausea and melena on examination.  Hemoglobin following.  Hemoglobin today is 6.7.  Patient be transfused 2 units of blood.  Protonix bolus and Protonix drip initiated.  I'll speak with the on-call gastroenterology will need to evaluate the patient tomorrow for likely endoscopy.  Antiplatelet agents will need to be held.  Admission to the hospitalist service.  Patient's girlfriend Santiago Glad updated as well as the patient.  Currently with baseline renal insufficiency    Final Clinical Impressions(s) / ED Diagnoses   Final diagnoses:  Upper GI bleed    New Prescriptions New Prescriptions   No medications on file     Jola Schmidt, MD 12/16/15  1731

## 2015-12-16 NOTE — Telephone Encounter (Signed)
Lt vm regarding scheduling appt per note from [facility] (staff message)

## 2015-12-16 NOTE — ED Notes (Signed)
Attempted IV x1 unsuccessful. Maylon Cos, RN at bedside w/ ultrasound.

## 2015-12-16 NOTE — ED Triage Notes (Signed)
Per EMS, pt here from [facility].  Pt c/o rectal bleeding x 1 week.  Dark stools.  Blood work done  Hb 6.2, HCT 19.  Pt was in ICU x 3 weeks ago for cbg 800.  A/O x 4.  cbg 211, Vitals 130/56, hr 62, resp 16, 95% ra

## 2015-12-16 NOTE — H&P (Signed)
History and Physical  Dillon Thomas [id number] DOB: [birth year] DOA: 12/16/2015  Referring physician: EDP PCP: [name], MD   Chief Complaint: dark stool 4-5 days, drop of hgb  HPI: Dillon Thomas is a 65 y.o. male  With h/o insulin dependent 28ms, HTN, CAD s/p NSTEMI, CKDIV, meningioma, dementia,  sent from SNF due to above complaints. He denies abdominal pain to EDP, but report intermittent mid abdominal pain in the last few weeks, no fever, no vomiting, no sob, no chest pain, no dizziness. Report baseline intermittent mild lower extremity edema  ED course: vital stable, FOBT + blood, kub no acute abdomen, hgb 6.7, plt 349, inr 1.37, cr 4.5, ua no acute findings, prbc ordered by EDP, [facility] consulted  By EDP who recommended keep patient npo, hospitalist to admit, gi will seen in am but will put patient on list to see tonight if emergency.   Of note: He was hospitalized from 10/29 to 11/6 for confusion with elevated blood sugar, no DKA per last discharge note, he also developed NSTEMI was treated with heparin drip briefly and discharged on brilinta. He was discharged to inpatient rehab then SNF   Review of Systems:  Detail per HPI, Review of systems are otherwise negative  Past Medical History:  Diagnosis Date  . BPH (benign prostatic hyperplasia)   . Chronic kidney disease   . Depression   . Diabetes mellitus without complication ([place])   . GERD (gastroesophageal reflux disease)   . Hyperlipidemia   . Hypertension   . Hypothyroidism   . TIA (transient ischemic attack)    Past Surgical History:  Procedure Laterality Date  . APPENDECTOMY    . CHOLECYSTECTOMY    . CYSTOURETHROSCOPY    . POLYPECTOMY     Vocal cord polyp removed  . VASECTOMY     Social History:  reports that he quit smoking about 3 years ago. He has never used smokeless tobacco. He reports that he does not drink alcohol or use drugs. Patient lives at SNF & is able to participate in activities of  daily living with assistance, walks with a walker  Allergies  Allergen Reactions  . Penicillins Other (See Comments)    Unknown  . Tetracyclines & Related Other (See Comments)    Unknown    Family History  Problem Relation Age of Onset  . CAD Mother   . Diabetes Mother   . Hyperlipidemia Mother   . Hypertension Mother   . CVA Mother   . Alcohol abuse Father   . Diabetes Father   . Hyperlipidemia Father       Prior to Admission medications   Medication Sig Start Date End Date Taking? Authorizing Provider  amLODipine (NORVASC) 10 MG tablet Take 10 mg by mouth daily.    Historical Provider, MD  aspirin EC 81 MG EC tablet Take 1 tablet (81 mg total) by mouth daily. 11/24/15   Maryann Mikhail, DO  atorvastatin (LIPITOR) 80 MG tablet Take 80 mg by mouth daily.    Historical Provider, MD  carvedilol (COREG) 25 MG tablet Take 25 mg by mouth 2 (two) times daily with a meal.    Historical Provider, MD  donepezil (ARICEPT) 10 MG tablet Take 1 tablet (10 mg total) by mouth at bedtime. 12/03/15   Lavon Paganini Angiulli, PA-C  ferrous sulfate 325 (65 FE) MG tablet Take 325 mg by mouth 2 (two) times daily.    Historical Provider, MD  folic acid (FOLVITE) 1 MG tablet Take  1 tablet (1 mg total) by mouth daily. 11/24/15   Maryann Mikhail, DO  hydrALAZINE (APRESOLINE) 50 MG tablet Take 50 mg by mouth every 8 (eight) hours.    Historical Provider, MD  HYDROcodone-acetaminophen (NORCO/VICODIN) 5-325 MG tablet Take 1 tablet by mouth every 6 (six) hours as needed for moderate pain. 12/03/15   Lavon Paganini Angiulli, PA-C  insulin glargine (LANTUS) 100 UNIT/ML injection Inject 15 Units into the skin daily.    Historical Provider, MD  insulin lispro (HUMALOG) 100 UNIT/ML injection Inject 0.05-0.1 mLs (5-10 Units total) into the skin 3 (three) times daily before meals. Per sliding scale < 150 = 0 units; 150-250 = 5 units; 251-300 = 8 units; 301-350 = 10 units and > 350 notify provider. 12/14/15   Dinah C Ngetich, NP    levothyroxine (SYNTHROID, LEVOTHROID) 25 MCG tablet Take 1 tablet (25 mcg total) by mouth daily before breakfast. 11/24/15   Maryann Mikhail, DO  Melatonin 3 MG TABS Take 1 tablet (3 mg total) by mouth at bedtime. 12/14/15   Dinah C Ngetich, NP  montelukast (SINGULAIR) 10 MG tablet Take 10 mg by mouth at bedtime.    Historical Provider, MD  pantoprazole (PROTONIX) 40 MG tablet Take 1 tablet (40 mg total) by mouth daily. 11/24/15   Maryann Mikhail, DO  sodium bicarbonate 650 MG tablet Take 1,300 mg by mouth 2 (two) times daily.    Historical Provider, MD  ticagrelor (BRILINTA) 90 MG TABS tablet Take 1 tablet (90 mg total) by mouth 2 (two) times daily. 11/23/15   Maryann Mikhail, DO  torsemide (DEMADEX) 20 MG tablet Take 20 mg by mouth 2 (two) times daily.    Historical Provider, MD  vitamin C (ASCORBIC ACID) 250 MG tablet Take 250 mg by mouth 2 (two) times daily.    Historical Provider, MD    Physical Exam: BP 154/66   Pulse 69   Temp 97.4 F (36.3 C) (Oral)   Resp 18   Ht 5\' 11"  (1.803 m)   Wt 95.3 kg (210 lb)   SpO2 96%   BMI 29.29 kg/m   General:  Pale , chronically ill, NAD Eyes: PERRL ENT: unremarkable Neck: supple, no JVD Cardiovascular: RRR Respiratory: CTABL Abdomen: mild mid abdomen  Tenderness, no guarding, no rebound, soft/ND, positive bowel sounds Skin: no rash Musculoskeletal:  trace edema Psychiatric: calm/cooperative Neurologic: poor memory, likely baseline dementia          Labs on Admission:  Basic Metabolic Panel:  Recent Labs Lab 12/16/15 1638  NA 138  K 3.7  CL 105  CO2 24  GLUCOSE 156*  BUN 62*  CREATININE 4.54*  CALCIUM 8.2*   Liver Function Tests:  Recent Labs Lab 12/16/15 1638  AST 15  ALT 13*  ALKPHOS 91  BILITOT 0.8  PROT 6.6  ALBUMIN 2.1*   No results for input(s): LIPASE, AMYLASE in the last 168 hours. No results for input(s): AMMONIA in the last 168 hours. CBC:  Recent Labs Lab 12/16/15 1638  WBC 8.3  NEUTROABS 3.2   HGB 6.7*  HCT 21.3*  MCV 91.0  PLT 349   Cardiac Enzymes: No results for input(s): CKTOTAL, CKMB, CKMBINDEX, TROPONINI in the last 168 hours.  BNP (last 3 results)  Recent Labs  11/15/15 2157  BNP 597.3*    ProBNP (last 3 results) No results for input(s): PROBNP in the last 8760 hours.  CBG: No results for input(s): GLUCAP in the last 168 hours.  Radiological Exams on Admission: No  results found.    Assessment/Plan Present on Admission: **None**  GI bleed,  reported melana 3-4 days, with some nausea, mild mid abdominal pain, kub no acute findings Suspect upper GI bleed Continue ppi that is started from the ED GI consulted by EDP, npo per GI recommendations  Acute blood anemia with baseline  anemia of chronic disease Patient received iv iron in 11/2 and 11/6 and ESA on 11/10 during last hospitalization by nephrology FOBt negative on 11/8 He was started on DAPT with brilinta and ASA by cardiology for NSTEMI during last hospitalization hgb baseline 8-9, hgb on admission 6.7, total bilirubin wnl, FOBT positive on admission, UA no rbc. prbc transfusion   AKI on CKDIV With h/o htn, dm2, also found to have mspike on protein electrophorosis with elevated kappa/lamda ratio, negative skeletal survey. No hypercalcemia Cr 3.5 a few weeks ago, 4.5 on this admission, likely from gi bleed, ua no infection, no blood, will repeat Renal US to r/o obstruction, will hold diuretics, hopefully cr will improve with prbc transfusion  complex renal right renal cortical mass with mural nodule, concerning for renal cell carcinoma. Renal function precludes contrasted imaging stuies of any type. Per outpatient note fro m today, he was seen by IR and Urology recently  Insulin dependent DM2, recent a1c 10,  Due to npo status, will hold long acting insulin, put on ssi.  CAD , recent h/o NSTEMI , he was evaluated  By Dr Einar Gip during last hospitalization Will hold asa and brilinta due to  suspected upper gi bleed with acute drop of hgb  HTN: bp stable, continue coreg, hold norvasc and hydralazine,   Meningioma: Extensive extra-axial mass lesion along the floor of the anterior cranial fossa/ planum sphenoidale region most consistent with .   Dementia: likely close to baseline, continue home meds   debilitation: recent h/o nondisplaced pubic rami fracture was evaluated by ortho, will need to go back to snf at discharge  DVT prophylaxis: scd's  Consultants: [facility] called by EDP  Code Status: full , verified with HPOA ms karen Miller who states she currently has an illness not able to get to the hospital in the next 2-3 days, but she is available over the phone  Family Communication:  Patient  And HPOA Ms Gertie Baron over the phone  Disposition Plan: admit to med tele  Time spent: 53mins  Dillon Reine MD, PhD [facility] Pager [phone] If 7PM-7AM, please contact night-coverage at [email or web address], password [facility]

## 2015-12-17 ENCOUNTER — Encounter (HOSPITAL_COMMUNITY): Payer: Self-pay

## 2015-12-17 ENCOUNTER — Inpatient Hospital Stay (HOSPITAL_COMMUNITY): Payer: MEDICARE | Admitting: Certified Registered Nurse Anesthetist

## 2015-12-17 ENCOUNTER — Encounter (HOSPITAL_COMMUNITY)
Admission: EM | Disposition: A | Discharge status: Skilled Nursing Facility | Payer: Self-pay | Source: Home / Self Care | Attending: Internal Medicine

## 2015-12-17 DIAGNOSIS — K253 Acute gastric ulcer without hemorrhage or perforation: Secondary | ICD-10-CM

## 2015-12-17 DIAGNOSIS — D62 Acute posthemorrhagic anemia: Secondary | ICD-10-CM

## 2015-12-17 DIAGNOSIS — R195 Other fecal abnormalities: Secondary | ICD-10-CM

## 2015-12-17 DIAGNOSIS — D638 Anemia in other chronic diseases classified elsewhere: Secondary | ICD-10-CM

## 2015-12-17 DIAGNOSIS — Z7902 Long term (current) use of antithrombotics/antiplatelets: Secondary | ICD-10-CM

## 2015-12-17 DIAGNOSIS — N184 Chronic kidney disease, stage 4 (severe): Secondary | ICD-10-CM

## 2015-12-17 DIAGNOSIS — N179 Acute kidney failure, unspecified: Secondary | ICD-10-CM

## 2015-12-17 HISTORY — PX: ESOPHAGOGASTRODUODENOSCOPY (EGD) WITH PROPOFOL: SHX5813

## 2015-12-17 LAB — PROTIME-INR
INR: 1.4
Prothrombin Time: 17.3 s — ABNORMAL HIGH (ref 11.4–15.2)

## 2015-12-17 LAB — HEMOGLOBIN AND HEMATOCRIT, BLOOD
HEMATOCRIT: 25.7 % — AB (ref 39.0–52.0)
Hemoglobin: 8.1 g/dL — ABNORMAL LOW (ref 13.0–17.0)

## 2015-12-17 LAB — COMPREHENSIVE METABOLIC PANEL WITH GFR
ALT: 12 U/L — ABNORMAL LOW (ref 17–63)
AST: 15 U/L (ref 15–41)
Albumin: 2 g/dL — ABNORMAL LOW (ref 3.5–5.0)
Alkaline Phosphatase: 90 U/L (ref 38–126)
Anion gap: 10 (ref 5–15)
BUN: 63 mg/dL — ABNORMAL HIGH (ref 6–20)
CO2: 24 mmol/L (ref 22–32)
Calcium: 8.1 mg/dL — ABNORMAL LOW (ref 8.9–10.3)
Chloride: 105 mmol/L (ref 101–111)
Creatinine, Ser: 4.37 mg/dL — ABNORMAL HIGH (ref 0.61–1.24)
GFR calc Af Amer: 15 mL/min — ABNORMAL LOW (ref >60)
GFR calc non Af Amer: 13 mL/min — ABNORMAL LOW (ref >60)
Glucose, Bld: 100 mg/dL — ABNORMAL HIGH (ref 65–99)
Potassium: 3.7 mmol/L (ref 3.5–5.1)
Sodium: 139 mmol/L (ref 135–145)
Total Bilirubin: 1.3 mg/dL — ABNORMAL HIGH (ref 0.3–1.2)
Total Protein: 6.2 g/dL — ABNORMAL LOW (ref 6.5–8.1)

## 2015-12-17 LAB — GLUCOSE, CAPILLARY
Glucose-Capillary: 96 mg/dL (ref 65–99)
Glucose-Capillary: 98 mg/dL (ref 65–99)
Glucose-Capillary: 122 mg/dL — ABNORMAL HIGH (ref 65–99)
Glucose-Capillary: 197 mg/dL — ABNORMAL HIGH (ref 65–99)

## 2015-12-17 LAB — CBC
HCT: 23.4 % — ABNORMAL LOW (ref 39.0–52.0)
HEMOGLOBIN: 7.6 g/dL — AB (ref 13.0–17.0)
MCH: 29.8 pg (ref 26.0–34.0)
MCHC: 32.5 g/dL (ref 30.0–36.0)
MCV: 91.8 fL (ref 78.0–100.0)
Platelets: 313 10*3/uL (ref 150–400)
RBC: 2.55 MIL/uL — AB (ref 4.22–5.81)
RDW: 15.9 % — ABNORMAL HIGH (ref 11.5–15.5)
WBC: 7.6 10*3/uL (ref 4.0–10.5)

## 2015-12-17 LAB — TSH: TSH: 5.96 u[IU]/mL — ABNORMAL HIGH (ref 0.350–4.500)

## 2015-12-17 SURGERY — ESOPHAGOGASTRODUODENOSCOPY (EGD) WITH PROPOFOL
Anesthesia: Monitor Anesthesia Care

## 2015-12-17 MED ORDER — PROPOFOL 500 MG/50ML IV EMUL
INTRAVENOUS | Status: DC | PRN
  Administered 2015-12-17: 200 ug/kg/min via INTRAVENOUS

## 2015-12-17 MED ORDER — ONDANSETRON HCL 4 MG/2ML IJ SOLN
INTRAMUSCULAR | Status: AC
  Filled 2015-12-17: qty 2

## 2015-12-17 MED ORDER — PROPOFOL 10 MG/ML IV BOLUS
INTRAVENOUS | Status: AC
  Filled 2015-12-17: qty 40

## 2015-12-17 MED ORDER — SODIUM CHLORIDE 0.9 % IV SOLN
INTRAVENOUS | Status: DC
  Administered 2015-12-17: 15:00:00 via INTRAVENOUS

## 2015-12-17 MED ORDER — PRO-STAT SUGAR FREE PO LIQD
30.0000 mL | Freq: Two times a day (BID) | ORAL | Status: DC
  Administered 2015-12-18: 30 mL via ORAL
  Filled 2015-12-17: qty 30

## 2015-12-17 MED ORDER — PROPOFOL 10 MG/ML IV BOLUS
INTRAVENOUS | Status: DC | PRN
  Administered 2015-12-17: 20 mg via INTRAVENOUS

## 2015-12-17 MED ORDER — ONDANSETRON HCL 4 MG/2ML IJ SOLN
INTRAMUSCULAR | Status: DC | PRN
Start: 2015-12-17 — End: 2015-12-17
  Administered 2015-12-17: 4 mg via INTRAVENOUS

## 2015-12-17 MED ORDER — SODIUM CHLORIDE 0.9 % IV SOLN: INTRAVENOUS | Status: DC

## 2015-12-17 SURGICAL SUPPLY — 14 items

## 2015-12-17 NOTE — Transfer of Care (Signed)
Immediate Anesthesia Transfer of Care Note  Patient: Dillon Thomas  Procedure(s) Performed: Procedure(s): ESOPHAGOGASTRODUODENOSCOPY (EGD) WITH PROPOFOL (N/A)  Patient Location: PACU  Anesthesia Type:MAC  Level of Consciousness:  sedated, patient cooperative and responds to stimulation  Airway & Oxygen Therapy:Patient Spontanous Breathing and Patient connected to face mask oxgen  Post-op Assessment:  Report given to PACU RN and Post -op Vital signs reviewed and stable  Post vital signs:  Reviewed and stable  Last Vitals:  Vitals:   12/17/15 1133 12/17/15 1225  BP: (!) 172/57   Pulse: 74 70  Resp: 12 14  Temp:      Complications: No apparent anesthesia complications

## 2015-12-17 NOTE — Consult Note (Signed)
Referring Provider: Dr. Venora Maples Primary Care Physician:  [name], MD Primary Gastroenterologist:  Althia Forts  Reason for Consultation:  Anemia and heme positive stool on Brilinta  HPI: Dillon Thomas is a 65 y.o. male with h/o insulin dependent DM, HTN, CAD s/p NSTEMI, CKD stage IV, meningioma, ? Mild dementia, sent from SNF/rehab due to dark stools for 4-5 days and drop in Hgb. He was hospitalized from 10/29 to 11/6 for confusion with elevated blood sugar, no DKA per last discharge note.  He also developed an NSTEMI. was treated with heparin drip briefly, and discharged on brilinta. He was discharged to inpatient rehab then SNF where he has been having very dark stools for the past few days.  Says that he finally had the nurses look at it and it tested positive for blood.  Hgb was 6.7 grams (down from 7.5 grams 9 days prior) so was sent to the ED.  He denies abdominal pain.  Reports some nausea and dry heaves prior to admission but no vomiting.  Takes protonix daily but still occasionally gets heartburn/reflux symptoms.  Recently started on ferrous sulfate 325 mg BID as well.  ED course: vitals stable, FOBT+ blood, kub no acute abdomen, hgb 6.7 grams, plt 349, inr 1.37, cr 4.5, ua no acute findings, prbc ordered by EDP, [facility] consulted by EDP who recommended keep patient npo, hospitalist to admit, gi will seen in am but will put patient on list to see tonight if emergency.   Patient was transfused with 2 units PRBC's with only slight increase in Hgb to 7.6 grams.  No BM since coming to the hospital.  Is on PPI gtt.  Says that he had an EGD and colonoscopy in [facility] in [place] about 5-7 years ago.  Can't remember exactly but thinks that EGD was normal and maybe had some polyps on colonoscopy.   Past Medical History:  Diagnosis Date  . BPH (benign prostatic hyperplasia)   . Chronic kidney disease   . Depression   . Diabetes mellitus without complication ([place])   .  GERD (gastroesophageal reflux disease)   . Hyperlipidemia   . Hypertension   . Hypothyroidism   . TIA (transient ischemic attack)     Past Surgical History:  Procedure Laterality Date  . APPENDECTOMY    . CHOLECYSTECTOMY    . CYSTOURETHROSCOPY    . POLYPECTOMY     Vocal cord polyp removed  . VASECTOMY      Prior to Admission medications   Medication Sig Start Date End Date Taking? Authorizing Provider  amLODipine (NORVASC) 10 MG tablet Take 10 mg by mouth daily.   Yes Historical Provider, MD  aspirin EC 81 MG EC tablet Take 1 tablet (81 mg total) by mouth daily. 11/24/15  Yes Maryann Mikhail, DO  donepezil (ARICEPT) 10 MG tablet Take 1 tablet (10 mg total) by mouth at bedtime. 12/03/15  Yes Daniel J Angiulli, PA-C  ferrous sulfate 325 (65 FE) MG tablet Take 325 mg by mouth 2 (two) times daily.   Yes Historical Provider, MD  folic acid (FOLVITE) 1 MG tablet Take 1 tablet (1 mg total) by mouth daily. 11/24/15  Yes Maryann Mikhail, DO  hydrALAZINE (APRESOLINE) 50 MG tablet Take 50 mg by mouth every 8 (eight) hours.   Yes Historical Provider, MD  insulin glargine (LANTUS) 100 UNIT/ML injection Inject 15 Units into the skin daily.   Yes Historical Provider, MD  insulin lispro (HUMALOG) 100 UNIT/ML injection Inject 0.05-0.1 mLs (  5-10 Units total) into the skin 3 (three) times daily before meals. Per sliding scale < 150 = 0 units; 150-250 = 5 units; 251-300 = 8 units; 301-350 = 10 units and > 350 notify provider. 12/14/15  Yes Dinah C Ngetich, NP  levothyroxine (SYNTHROID, LEVOTHROID) 25 MCG tablet Take 1 tablet (25 mcg total) by mouth daily before breakfast. 11/24/15  Yes Maryann Mikhail, DO  Melatonin 3 MG TABS Take 1 tablet (3 mg total) by mouth at bedtime. 12/14/15  Yes Dinah C Ngetich, NP  montelukast (SINGULAIR) 10 MG tablet Take 10 mg by mouth at bedtime.   Yes Historical Provider, MD  Multiple Vitamins-Minerals (DECUBI-VITE PO) Take 1 tablet by mouth daily.   Yes Historical Provider, MD    pantoprazole (PROTONIX) 40 MG tablet Take 1 tablet (40 mg total) by mouth daily. 11/24/15  Yes Maryann Mikhail, DO  ticagrelor (BRILINTA) 90 MG TABS tablet Take 1 tablet (90 mg total) by mouth 2 (two) times daily. 11/23/15  Yes Maryann Mikhail, DO  torsemide (DEMADEX) 20 MG tablet Take 20 mg by mouth 2 (two) times daily.   Yes Historical Provider, MD    Current Facility-Administered Medications  Medication Dose Route Frequency Provider Last Rate Last Dose  . 0.9 %  sodium chloride infusion   Intravenous Once Jola Schmidt, MD      . atorvastatin (LIPITOR) tablet 80 mg  80 mg Oral Daily Florencia Reasons, MD   80 mg at 12/17/15 0917  . carvedilol (COREG) tablet 25 mg  25 mg Oral BID WC Gardiner Barefoot, NP      . donepezil (ARICEPT) tablet 10 mg  10 mg Oral QHS Florencia Reasons, MD      . ferrous sulfate tablet 325 mg  325 mg Oral BID Florencia Reasons, MD   325 mg at 12/17/15 0916  . folic acid (FOLVITE) tablet 1 mg  1 mg Oral Daily Florencia Reasons, MD   1 mg at 12/17/15 [id number]  . hydrALAZINE (APRESOLINE) injection 10 mg  10 mg Intravenous Q6H PRN Florencia Reasons, MD      . insulin aspart (novoLOG) injection 0-9 Units  0-9 Units Subcutaneous Q4H Florencia Reasons, MD      . levothyroxine (SYNTHROID, LEVOTHROID) tablet 25 mcg  25 mcg Oral QAC breakfast Florencia Reasons, MD   25 mcg at 12/17/15 0802  . montelukast (SINGULAIR) tablet 10 mg  10 mg Oral QHS Florencia Reasons, MD      . ondansetron [facility]) injection 4 mg  4 mg Intravenous Q6H PRN Gardiner Barefoot, NP   4 mg at 12/16/15 2232  . pantoprazole (PROTONIX) 80 mg in sodium chloride 0.9 % 250 mL (0.32 mg/mL) infusion  8 mg/hr Intravenous Continuous Jola Schmidt, MD 25 mL/hr at 12/17/15 0357 8 mg/hr at 12/17/15 0357  . [START ON 12/20/2015] pantoprazole (PROTONIX) injection 40 mg  40 mg Intravenous Q12H Jola Schmidt, MD      . sodium bicarbonate tablet 1,300 mg  1,300 mg Oral BID Florencia Reasons, MD   1,300 mg at 12/17/15 0916  . sodium chloride flush (NS) 0.9 % injection 3 mL  3 mL Intravenous Q12H Florencia Reasons, MD   3 mL  at 12/17/15 0918    Allergies as of 12/16/2015 - Review Complete 12/16/2015  Allergen Reaction Noted  . Penicillins Other (See Comments) 11/15/2015  . Tetracyclines & related Other (See Comments) 11/15/2015    Family History  Problem Relation Age of Onset  . CAD Mother   . Diabetes Mother   .  Hyperlipidemia Mother   . Hypertension Mother   . CVA Mother   . Alcohol abuse Father   . Diabetes Father   . Hyperlipidemia Father     Social History   Social History  . Marital status: Single    Spouse name: N/A  . Number of children: N/A  . Years of education: N/A   Occupational History  . Not on file.   Social History Main Topics  . Smoking status: Former Smoker    Quit date: 03/31/2012  . Smokeless tobacco: Never Used  . Alcohol use No  . Drug use: No  . Sexual activity: Not on file   Other Topics Concern  . Not on file   Social History Narrative  . No narrative on file    Review of Systems: Ten point ROS is O/W negative except as mentioned in HPI.  Physical Exam: Vital signs in last 24 hours: Temp:  [97.4 F (36.3 C)-98.4 F (36.9 C)] 97.6 F (36.4 C) (11/30 0348) Pulse Rate:  [56-102] 70 (11/30 0348) Resp:  [16-18] 18 (11/30 0348) BP: (133-154)/(54-72) 141/58 (11/30 0348) SpO2:  [93 %-100 %] 95 % (11/30 0348) Weight:  [210 lb (95.3 kg)-210 lb 9.6 oz (95.5 kg)] 210 lb (95.3 kg) (11/29 1542) Last BM Date: 12/16/15 General:  Alert, Well-developed, well-nourished, pleasant and cooperative in NAD Head:  Normocephalic and atraumatic. Eyes:  Sclera clear, no icterus.  Conjunctiva pink.  Left eye with some redness/popped vessels. Ears:  Normal auditory acuity. Mouth:  No deformity or lesions.   Lungs:  Clear throughout to auscultation.  No wheezes, crackles, or rhonchi.  Heart:  Regular rate and rhythm; no murmurs, clicks, rubs, or gallops. Abdomen:  Soft, non-distended.  BS present.  Non-tender. Rectal:  Deferred.  Heme positive by EDP.  Msk:  Symmetrical  without gross deformities. Pulses:  Normal pulses noted. Extremities:  Without clubbing or edema. Neurologic:  Alert and oriented x 4;  grossly normal neurologically. Skin:  Intact without significant lesions or rashes. Psych:  Alert and cooperative. Normal mood and affect.  Intake/Output from previous day: 11/29 0701 - 11/30 0700 In: 1057.1 [I.V.:287.1; Blood:670; IV Piggyback:100] Out: -    Lab Results:  Recent Labs  12/16/15 1638 12/17/15 0556  WBC 8.3 7.6  HGB 6.7* 7.6*  HCT 21.3* 23.4*  PLT 349 313   BMET  Recent Labs  12/16/15 1638 12/17/15 0556  NA 138 139  K 3.7 3.7  CL 105 105  CO2 24 24  GLUCOSE 156* 100*  BUN 62* 63*  CREATININE 4.54* 4.37*  CALCIUM 8.2* 8.1*   LFT  Recent Labs  12/16/15 1638 12/17/15 0556  PROT 6.6 6.2*  ALBUMIN 2.1* 2.0*  AST 15 15  ALT 13* 12*  ALKPHOS 91 90  BILITOT 0.8 1.3*  BILIDIR <0.1*  --   IBILI NOT CALCULATED  --    PT/INR  Recent Labs  12/16/15 1638 12/17/15 0556  LABPROT 17.0* 17.3*  INR 1.37 1.40   Studies/Results: US Renal  Addendum Date: 12/16/2015   ADDENDUM REPORT: 12/16/2015 21:56 ADDENDUM: Should be noted a stable left-sided pleural effusion is noted as well. Electronically Signed   By: Inez Catalina M.D.   On: 12/16/2015 21:56   Result Date: 12/16/2015 CLINICAL DATA:  Elevated creatinine EXAM: RENAL / URINARY TRACT ULTRASOUND COMPLETE COMPARISON:  11/19/2015, 11/21/2015 FINDINGS: Right Kidney: Length: 12.3 cm. There is a cystic lesion in the upper pole of the right kidney again identified and stable from the recent  exam. This is been evaluated on recent MRI with evidence of a mural nodule. The mural nodule is not well appreciated on today's exam and may have represented focal thrombus which has resolved. Additionally lower pole cysts are noted. The largest of these measures 1.3 cm. These are also stable from the prior MR and ultrasound. Very mild fullness of the collecting system is seen. Relative  increased echogenicity of the kidney is noted. Left Kidney: Length: 12.5 cm. 2.6 cm cyst is noted within the left kidney also similar to that seen on prior ultrasound and MR. relative increased echogenicity of the kidneys noted. Bladder: Appears normal for degree of bladder distention. IMPRESSION: Bilateral cystic lesions are again identified similar to that seen on prior MRI and ultrasound. The only significant difference is the absence of the mural nodule seen in the upper pole cystic lesion on the right. This may have represented thrombus which has resolved. Increased echogenicity consistent with medical renal disease. Electronically Signed: By: Inez Catalina M.D. On: 12/16/2015 21:43   Dg Abdomen Acute W/chest  Result Date: 12/16/2015 CLINICAL DATA:  Tarry stools for several days EXAM: DG ABDOMEN ACUTE W/ 1V CHEST COMPARISON:  11/26/2015. FINDINGS: Cardiac shadow is stable. The lungs are well aerated bilaterally. Mild interstitial changes are noted without focal infiltrate or sizable effusion. Scattered large and small bowel gas is noted. No free air is seen. No abnormal mass or abnormal calcifications are noted. Degenerative changes of the lumbar spine are seen. Postsurgical changes are noted. IMPRESSION: Chronic interstitial changes within the lungs. No acute abnormality in the abdomen. Electronically Signed   By: Inez Catalina M.D.   On: 12/16/2015 18:29   IMPRESSION:  -65 year old male who was recently started on Brilinta for NSTEMI now presenting with dark/black heme positive stools and a slight drop in Hgb.  Has received 2 units PRBC's with mild increase in Hgb. -Anemia of chronic disease:  Hgb not over 8 recently.  Likely due at least in part to CKD.  PLAN: -Planning for EGD this AM to rule out source of bleeding due to need for ongoing antiplatelet medication. -Continue PPI gtt for now, which can be changed pending EGD findings. -Monitor Hgb and transfuse prn.   ZEHR, JESSICA D.   12/17/2015, 10:09 AM Pager number [phone]  GI ATTENDING  History, laboratories, x-rays reviewed. Patient personally seen and examined. Agree with H&P as outlined above. Very complicated Patient with chronic anemia. Slight drift in hemoglobin from baseline. Most likely cause for anemia is chronic renal disease. Heme positive stools have been dark reported. Recently placed on Brilinta. Has been on PPI. We will perform upper endoscopy to exclude any significant abnormality.The nature of the procedure, as well as the risks, benefits, and alternatives were carefully and thoroughly reviewed with the patient. Ample time for discussion and questions allowed. The patient understood, was satisfied, and agreed to proceed. If so, recommendations to follow. If not, recommend transfusing to clinically desired hemoglobin level per primary service. Also, he may need Epogen. Will leave to primary service. Hopefully these being followed by renal. Discussed with patient.  Docia Chuck. Geri [place]., M.D. [facility] Division of Gastroenterology

## 2015-12-17 NOTE — Op Note (Signed)
[facility] Patient Name: Dillon Thomas Procedure Date: 12/17/2015 MRN: [id number] Attending MD: Docia Chuck. Henrene Pastor , MD Date of Birth: 16-Nov-1950 CSN: [id number] Age: 65 Admit Type: Inpatient Procedure:                Upper GI endoscopy Indications:              Heme positive stool. Reported dark stools.Drift in                            hemoglobin Providers:                Docia Chuck. Henrene Pastor, MD, Cleda Daub, RN, Elspeth Cho Tech., Technician, Virgia Land, CRNA Referring MD:             [facility] Medicines:                Monitored Anesthesia Care Complications:            No immediate complications. Estimated Blood Loss:     Estimated blood loss: none. Procedure:                Pre-Anesthesia Assessment:                           - Prior to the procedure, a History and Physical                            was performed, and patient medications and                            allergies were reviewed. The patient's tolerance of                            previous anesthesia was also reviewed. The risks                            and benefits of the procedure and the sedation                            options and risks were discussed with the patient.                            All questions were answered, and informed consent                            was obtained. Prior Anticoagulants: The patient has                            taken antiplatelet medication, last dose was day of                            procedure. ASA Grade Assessment: III - A patient  with severe systemic disease. After reviewing the                            risks and benefits, the patient was deemed in                            satisfactory condition to undergo the procedure.                           After obtaining informed consent, the endoscope was                            passed under direct vision. Throughout the                procedure, the patient's blood pressure, pulse, and                            oxygen saturations were monitored continuously. The                            was introduced through the mouth, and advanced to                            the second part of duodenum. The upper GI endoscopy                            was accomplished without difficulty. The patient                            tolerated the procedure well. Scope In: Scope Out: Findings:      The esophagus was normal.      The stomach was normal, except for a few tiny antral erosions and a       sliding hiatal hernia.      The examined duodenum was normal.      The cardia and gastric fundus were normal on retroflexion.      There was no blood or bleeding. Impression:               - Normal EGD save small erosionswhich could account                            for heme-positive stool.                           - RECTAL EXAM REVEALED DARK BROWN/IRON STAINED                            STOOL, but no true melena                           - Anemia of chronic disease Moderate Sedation:      none Recommendation:           1. Continue PPI as long as he is on Brilinta and/or  aspirin/NSAID                           2. Transfused clinically desired hemoglobin                           3. Needs to establish with nephrology in                            [place]. May need Epogen                           4. No further GI workup planned. Will sign off.                            Resume previous diet. Procedure Code(s):        --- Professional ---                           [phone], Esophagogastroduodenoscopy, flexible,                            transoral; diagnostic, including collection of                            specimen(s) by brushing or washing, when performed                            (separate procedure) Diagnosis Code(s):        --- Professional ---                           K31.89, Other  diseases of stomach and duodenum                           R19.5, Other fecal abnormalities CPT copyright 2016 American Medical Association. All rights reserved. The codes documented in this report are preliminary and upon coder review may  be revised to meet current compliance requirements. Docia Chuck. Henrene Pastor, MD 12/17/2015 12:33:17 PM This report has been signed electronically. Number of Addenda: 0

## 2015-12-17 NOTE — Anesthesia Preprocedure Evaluation (Addendum)
Anesthesia Evaluation  Patient identified by MRN, date of birth, ID band  Reviewed: Allergy & Precautions, NPO status , Patient's Chart, lab work & pertinent test results  History of Anesthesia Complications (+) PROLONGED EMERGENCE  Airway Mallampati: II  TM Distance: >3 FB     Dental   Pulmonary former smoker,    breath sounds clear to auscultation       Cardiovascular hypertension, + Past MI   Rhythm:Regular Rate:Normal     Neuro/Psych    GI/Hepatic Neg liver ROS, GERD  ,  Endo/Other  diabetes  Renal/GU Renal disease     Musculoskeletal   Abdominal   Peds  Hematology   Anesthesia Other Findings   Reproductive/Obstetrics                            Anesthesia Physical Anesthesia Plan  ASA: III  Anesthesia Plan: MAC   Post-op Pain Management:    Induction: Intravenous  Airway Management Planned: Simple Face Mask  Additional Equipment:   Intra-op Plan:   Post-operative Plan:   Informed Consent: I have reviewed the patients History and Physical, chart, labs and discussed the procedure including the risks, benefits and alternatives for the proposed anesthesia with the patient or authorized representative who has indicated his/her understanding and acceptance.   Dental advisory given  Plan Discussed with: CRNA and Anesthesiologist  Anesthesia Plan Comments:         Anesthesia Quick Evaluation

## 2015-12-17 NOTE — Anesthesia Postprocedure Evaluation (Signed)
Anesthesia Post Note  Patient: Dillon Thomas  Procedure(s) Performed: Procedure(s) (LRB): ESOPHAGOGASTRODUODENOSCOPY (EGD) WITH PROPOFOL (N/A)  Patient location during evaluation: PACU Anesthesia Type: General Level of consciousness: awake Vital Signs Assessment: post-procedure vital signs reviewed and stable Cardiovascular status: stable Anesthetic complications: no    Last Vitals:  Vitals:   12/17/15 1250 12/17/15 1319  BP:  133/64  Pulse: 73 70  Resp: 14 16  Temp:  36.6 C    Last Pain:  Vitals:   12/17/15 1319  TempSrc: Oral  PainSc:                  EDWARDS,[name]

## 2015-12-17 NOTE — NC FL2 (Signed)
[place] LEVEL OF CARE SCREENING TOOL     IDENTIFICATION  Patient Name: Dillon Thomas Birthdate: [birth year] Sex: male Admission Date (Current Location): 12/16/2015  [facility] and [place] Number:  Herbalist and Address:  [facility],  [place] [address], [place]      Provider Number: [id number]  Attending Physician Name and Address:  Annita Brod, MD  Relative Name and Phone Number:       Current Level of Care: Hospital Recommended Level of Care: [place] Prior Approval Number:    Date Approved/Denied:   PASRR Number: [id number]  Discharge Plan: SNF    Current Diagnoses: Patient Active Problem List   Diagnosis Date Noted  . Upper GI bleed 12/16/2015  . GI bleed 12/16/2015  . Hypokalemia   . Fracture   . Elevated serum immunoglobulin free light chains   . Renovascular hypertension   . Closed fracture of multiple pubic rami, left, initial encounter ([place])   . Fluid overload   . DM type 2 with diabetic peripheral neuropathy ([place])   . Left sided chest pain   . Sleep disturbance   . Hypoalbuminemia due to protein-calorie malnutrition ([place])   . Pleural effusion   . Essential hypertension   . Poorly controlled type 2 diabetes mellitus with peripheral neuropathy ([place])   . Rib pain on left side   . Debility 11/23/2015  . Debilitated 11/23/2015  . Anemia of chronic disease   . Renal mass   . Hyperlipidemia   . Acute blood loss anemia   . Benign essential HTN   . Diabetic ketoacidosis with coma associated with type 2 diabetes mellitus ([place])   . Hemangioma   . History of TIA (transient ischemic attack)   . Hyperglycemia   . Hypothyroidism   . Lymphocytosis   . NSTEMI (non-ST elevated myocardial infarction) ([place])   . Spondylosis of cervical region without myelopathy or radiculopathy   . Uncontrolled type 2 diabetes mellitus with peripheral neuropathy ([place])   . Skin tear of left elbow without  complication   . Pressure injury of skin 11/16/2015  . Obtunded   . Right sided weakness   . Altered mental status 11/15/2015  . Acute metabolic encephalopathy [id number]  . AKI (acute kidney injury) ([place]) 11/15/2015  . Chronic kidney disease (CKD), stage IV (severe) ([place]) 11/15/2015  . Anemia 11/15/2015  . Hyponatremia 11/15/2015  . Insulin dependent diabetes mellitus ([place]) 11/15/2015  . Elevated troponin 11/15/2015  . Tachycardia 11/15/2015  . Accelerated hypertension 11/15/2015    Orientation RESPIRATION BLADDER Height & Weight     Self, Time, Situation, Place  Normal Continent Weight: 210 lb (95.3 kg) Height:  5\' 11"  (180.3 cm)  BEHAVIORAL SYMPTOMS/MOOD NEUROLOGICAL BOWEL NUTRITION STATUS      Continent Diet (carb modified)  AMBULATORY STATUS COMMUNICATION OF NEEDS Skin   Limited Assist Verbally PU Stage and Appropriate Care PU Stage 1 Dressing: No Dressing (Stage I -  Intact skin with non-blanchable redness of a localized area usually over a bony prominence) PU Stage 2 Dressing: Daily (Stage II -  Partial thickness loss of dermis presenting as a shallow open ulcer with a red, pink wound bed without slough.)                   Personal Care Assistance Level of Assistance  Bathing, Dressing, Feeding Bathing Assistance: Limited assistance Feeding assistance: Independent Dressing Assistance: Limited assistance     Functional Limitations Info  Sight, Hearing, Speech Sight Info: Adequate Hearing Info: Adequate Speech Info: Adequate    SPECIAL CARE FACTORS FREQUENCY  PT (By licensed PT), OT (By licensed OT), Blood pressure Blood Pressure Frequency: 2x weekly   PT Frequency: 5x/week OT Frequency: 5x/week            Contractures Contractures Info: Not present    Additional Factors Info  Code Status, Allergies Code Status Info: Full Code Allergies Info:  Penicillins, Tetracyclines & Related           Current Medications (12/17/2015):  This is the current  hospital active medication list Current Facility-Administered Medications  Medication Dose Route Frequency Provider Last Rate Last Dose  . 0.9 %  sodium chloride infusion   Intravenous Continuous Laban Emperor Zehr, PA-C      . [MAR Hold] atorvastatin (LIPITOR) tablet 80 mg  80 mg Oral Daily Florencia Reasons, MD   80 mg at 12/17/15 0917  . [MAR Hold] carvedilol (COREG) tablet 25 mg  25 mg Oral BID WC Gardiner Barefoot, NP      . Doug Sou Hold] donepezil (ARICEPT) tablet 10 mg  10 mg Oral QHS Florencia Reasons, MD      . Doug Sou Hold] ferrous sulfate tablet 325 mg  325 mg Oral BID Florencia Reasons, MD   325 mg at 12/17/15 0916  . [MAR Hold] folic acid (FOLVITE) tablet 1 mg  1 mg Oral Daily Florencia Reasons, MD   1 mg at 12/17/15 0916  . [MAR Hold] hydrALAZINE (APRESOLINE) injection 10 mg  10 mg Intravenous Q6H PRN Florencia Reasons, MD      . Doug Sou Hold] insulin aspart (novoLOG) injection 0-9 Units  0-9 Units Subcutaneous Q4H Florencia Reasons, MD      . Doug Sou Hold] levothyroxine (SYNTHROID, LEVOTHROID) tablet 25 mcg  25 mcg Oral QAC breakfast Florencia Reasons, MD   25 mcg at 12/17/15 0802  . [MAR Hold] montelukast (SINGULAIR) tablet 10 mg  10 mg Oral QHS Florencia Reasons, MD      . Doug Sou Hold] ondansetron [facility]) injection 4 mg  4 mg Intravenous Q6H PRN Gardiner Barefoot, NP   4 mg at 12/16/15 2232  . pantoprazole (PROTONIX) 80 mg in sodium chloride 0.9 % 250 mL (0.32 mg/mL) infusion  8 mg/hr Intravenous Continuous Jola Schmidt, MD 25 mL/hr at 12/17/15 0357 8 mg/hr at 12/17/15 0357  . [MAR Hold] pantoprazole (PROTONIX) injection 40 mg  40 mg Intravenous Q12H Jola Schmidt, MD      . Doug Sou Hold] sodium bicarbonate tablet 1,300 mg  1,300 mg Oral BID Florencia Reasons, MD   1,300 mg at 12/17/15 0916  . [MAR Hold] sodium chloride flush (NS) 0.9 % injection 3 mL  3 mL Intravenous Q12H Florencia Reasons, MD   3 mL at 12/17/15 [id number]   Facility-Administered Medications Ordered in Other Encounters  Medication Dose Route Frequency Provider Last Rate Last Dose  . ondansetron (ZOFRAN) injection   Intravenous  Anesthesia Intra-op Maxwell Caul, CRNA   4 mg at 12/17/15 1206  . propofol (DIPRIVAN) 10 mg/mL bolus/IV push    Anesthesia Intra-op Maxwell Caul, CRNA   20 mg at 12/17/15 1211  . propofol (DIPRIVAN) 500 MG/50ML infusion    Continuous PRN Maxwell Caul, CRNA   Stopped at 12/17/15 1218     Discharge Medications: Please see discharge summary for a list of discharge medications.  Relevant Imaging Results:  Relevant Lab Results:   Additional Information SSN: [id number], Evie Lacks,  LCSW

## 2015-12-17 NOTE — Progress Notes (Signed)
Initial Nutrition Assessment  DOCUMENTATION CODES:   Not applicable  INTERVENTION:   Prostat SF- BID which provides 100kcal and 15g protein per serving.   NUTRITION DIAGNOSIS:   Increased nutrient needs related to wound healing as evidenced by increased estimated needs from protein.  GOAL:   Patient will meet greater than or equal to 90% of their needs  MONITOR:   PO intake, Supplement acceptance, wounds, and Labs (BUN)  REASON FOR ASSESSMENT:   Malnutrition Screening Tool    ASSESSMENT:   65 y.o. male with h/o insulin dependent DM, HTN, CAD s/p NSTEMI, CKD stage IV, meningioma, ? Mild dementia, sent from SNF/rehab due to dark stools for 4-5 days and drop in Hgb. He was hospitalized from 10/29 to 11/6 for confusion with elevated blood sugar, no DKA per last discharge note.  He also developed an NSTEMI. was treated with heparin drip briefly, and discharged on brilinta. He was discharged to inpatient rehab then SNF where he has been having very dark stools for the past few days.  Says that he finally had the nurses look at it and it tested positive for blood.  Hgb was 6.7 grams (down from 7.5 grams 9 days prior) so was sent to the ED.  He denies abdominal pain.  Reports some nausea and dry heaves prior to admission but no vomiting.  Takes protonix daily but still occasionally gets heartburn/reflux symptoms.  Recently started on ferrous sulfate 325 mg BID as well.   Met with pt in room today. Pt reports good appetite and eating 100% meals. Pt reports that his appetite was poor for 2 weeks pta and that he had been eating ~50% meals. Patient resides in SNF. Pt has multiple pressure ulcers that he claims wont heal. Pt reports that he weighed 230lbs about 8 months ago and that he has lost a lot of wt. Pt has many fluctuations in weights per chart, pt has lost 13lbs(6%) in the past month if wts accurate. This would be considered significant. No previous wt history so unsure as to the true  amount of wt loss. Pt would prefer prostats over Ensure/Glucerna. He has had the prostats before. Will order Prostats to encourage wound healing; can decrease to once a day if BUN increases.      Medications reviewed and include: ferrous sulfate, folic acid, insulin, synthroid, protonix, zofran   Labs reviewed: BUN 63(H), creat 4.37(H), Ca 8.1(L) adj 9.7 wnl, Alb 2.0(L), ALT 12(L), Tbili 1.3(H) Hgb 7.6(L), Hct 23.4(L) BG-156 11/29, AIC 10.0 10/30  Nutrition-Focused physical exam completed. Findings are no fat depletion, mild muscle depletion, and no edema.   Diet Order:  Diet Carb Modified Fluid consistency: Thin; Room service appropriate? Yes  Skin:   Stage I PU L heel, Stage II PU sacrum   Last BM:  11/29  Height:   Ht Readings from Last 1 Encounters:  12/16/15 _0  (1.803 m)    Weight:   Wt Readings from Last 1 Encounters:  12/16/15 210 lb (95.3 kg)    Ideal Body Weight:  78.1 kg  BMI:  Body mass index is 29.29 kg/m.  Estimated Nutritional Needs:   Kcal:  2000-2300kcal/day   Protein:  114-134g/day   Fluid:  >2.0L/day   EDUCATION NEEDS:   No education needs identified at this time  Koleen Distance, RD, LDN

## 2015-12-17 NOTE — Progress Notes (Signed)
Dillon NOTE  Dillon Thomas [id number] DOB: [birth year] DOA: 12/16/2015 PCP: Dillon Castle, MD  HPI/Recap of past 24 hours:  Patient is a 65 year old male past mental history of diabetes mellitus and stage IV chronic kidney disease and CAD who was discharged from the hospitalist service less than a month ago with hyperglycemia and complicated by a non-STEMI and patient was discharged to a skilled nursing facility on Brilinta.  Patient's labs were continued to be followed noting a creatinine of 3.6 and hemoglobin of 9 on 11/11. Subsequent labs over the next few days noted a downward trend in his hemoglobin and an increase in his creatinine. On 11/29, patient had blood work done which noted a hemoglobin of 6.7 and a creatinine of 4.54. Patient was sent over to the emergency room which confirmed these labs. Emergency room he is also noted to be Hemoccult positive. He was ordered 2 units of packed red blood cells. Gastroenterology was consulted.  This morning, patient's creatinine down to 4.37 and hemoglobin up to 7.6. Seen by GI and taken for endoscopy. Patient was found abnormal EGD save for some small erosions which would account for heme positive stool and rectal exam noting dark brown iron stain stool, but no true melena. It was felt that this may be more anemia of chronic disease. It was recommended that as long as he is on aspirin or NSAIDs, continue a PPI. They also recommended him establishing with nephrology as he might need Epogen in the future.   Patient seen before endoscopy. His main complaint was feeling hungry. He also has some discomfort from his back side from a stage I decubitus ulcer and some stage I heel ulcers present on admission from prolonged sitting.  He was quite hungry  Assessment/Plan: Active Problems:   AKI (acute kidney injury) ([place]) on Chronic kidney disease (CKD), stage IV (severe) ([place]): Suspect the acute kidney injury may be from some slow blood loss. Will  try some gentle hydration. Monitor hemoglobin   Insulin dependent diabetes mellitus (Dillon Thomas): Monitor CBGs now that he is resume by mouth   Acute blood loss anemia from gastric erosions in the setting of Anemia of chronic disease: Okay to restart Brilinta.  PPI. Okay to eat CAD with recent non-STEMI: Okay to restart Brilinta   Code Status: Full code  Family Communication: Left message with family   Disposition Plan: Anticipate return back to skilled nursing tomorrow as long as creatinine somewhat improved and hemoglobin stable    Consultants:  [place]-gastroenterology   Procedures:  EGD: Normal EGD, several erosions noted   Antimicrobials:  None   DVT prophylaxis:  SCDs   Objective: Vitals:   12/17/15 1230 12/17/15 1240 12/17/15 1250 12/17/15 1319  BP: (!) 118/50 (!) 151/56  133/64  Pulse: 71 74 73 70  Resp: 14 14 14 16   Temp: 97 F (36.1 C)   97.8 F (36.6 C)  TempSrc: Oral   Oral  SpO2: 100% 100% 98% 94%  Weight:      Height:        Intake/Output Summary (Last 24 hours) at 12/17/15 1433 Last data filed at 12/17/15 1400  Gross per 24 hour  Intake          1257.08 ml  Output              300 ml  Net           957.08 ml   Filed Weights   12/16/15 1519 12/16/15 1542  Weight: 95.3 kg (210 lb) 95.3 kg (210 lb)    Exam:   General:  Alert and oriented 3, no acute distress   Cardiovascular: Regular rate and rhythm, S1-S2   Respiratory: Clear to auscultation bilaterally   Abdomen: Soft, nontender, nondistended, positive bowel sounds   Musculoskeletal: No clubbing or cyanosis, trace edema   Skin: Patient has a stage I decubitus on his backside, also on his heels. Present on admission  Psychiatry: Patient is appropriate, no evidence of psychoses    Data Reviewed: CBC:  Recent Labs Lab 12/16/15 1638 12/17/15 0556  WBC 8.3 7.6  NEUTROABS 3.2  --   HGB 6.7* 7.6*  HCT 21.3* 23.4*  MCV 91.0 91.8  PLT 349 [id number]   Basic Metabolic Panel:  Recent  Labs Lab 12/16/15 1638 12/17/15 0556  NA 138 139  K 3.7 3.7  CL 105 105  CO2 24 24  GLUCOSE 156* 100*  BUN 62* 63*  CREATININE 4.54* 4.37*  CALCIUM 8.2* 8.1*   GFR: Estimated Creatinine Clearance: 19.6 mL/min (by C-G formula based on SCr of 4.37 mg/dL (H)). Liver Function Tests:  Recent Labs Lab 12/16/15 1638 12/17/15 0556  AST 15 15  ALT 13* 12*  ALKPHOS 91 90  BILITOT 0.8 1.3*  PROT 6.6 6.2*  ALBUMIN 2.1* 2.0*   No results for input(s): LIPASE, AMYLASE in the last 168 hours. No results for input(s): AMMONIA in the last 168 hours. Coagulation Profile:  Recent Labs Lab 12/16/15 1638 12/17/15 0556  INR 1.37 1.40   Cardiac Enzymes: No results for input(s): CKTOTAL, CKMB, CKMBINDEX, TROPONINI in the last 168 hours. BNP (last 3 results) No results for input(s): PROBNP in the last 8760 hours. HbA1C: No results for input(s): HGBA1C in the last 72 hours. CBG:  Recent Labs Lab 12/16/15 2138 12/16/15 2357 12/17/15 0400 12/17/15 0751  GLUCAP 106* 106* 96 98   Lipid Profile: No results for input(s): CHOL, HDL, LDLCALC, TRIG, CHOLHDL, LDLDIRECT in the last 72 hours. Thyroid Function Tests:  Recent Labs  12/17/15 0556  TSH 5.960*   Anemia Panel: No results for input(s): VITAMINB12, FOLATE, FERRITIN, TIBC, IRON, RETICCTPCT in the last 72 hours. Urine analysis:    Component Value Date/Time   COLORURINE YELLOW 12/16/2015 1750   APPEARANCEUR CLEAR 12/16/2015 1750   APPEARANCEUR Clear 01/09/2013 1858   LABSPEC 1.014 12/16/2015 1750   LABSPEC 1.015 01/09/2013 1858   PHURINE 6.5 12/16/2015 1750   GLUCOSEU 100 (A) 12/16/2015 1750   GLUCOSEU >=500 01/09/2013 1858   HGBUR NEGATIVE 12/16/2015 1750   BILIRUBINUR NEGATIVE 12/16/2015 1750   BILIRUBINUR Negative 01/09/2013 1858   KETONESUR NEGATIVE 12/16/2015 1750   PROTEINUR >300 (A) 12/16/2015 1750   NITRITE NEGATIVE 12/16/2015 1750   LEUKOCYTESUR NEGATIVE 12/16/2015 1750   LEUKOCYTESUR Negative 01/09/2013  1858   Sepsis Labs: @LABRCNTIP (procalcitonin:4,lacticidven:4)  )No results found for this or any previous visit (from the past 240 hour(s)).    Studies: US Renal  Addendum Date: 12/16/2015   ADDENDUM REPORT: 12/16/2015 21:56 ADDENDUM: Should be noted a stable left-sided pleural effusion is noted as well. Electronically Signed   By: [place] Catalina M.D.   On: 12/16/2015 21:56   Result Date: 12/16/2015 CLINICAL DATA:  Elevated creatinine EXAM: RENAL / URINARY TRACT ULTRASOUND COMPLETE COMPARISON:  11/19/2015, 11/21/2015 FINDINGS: Right Kidney: Length: 12.3 cm. There is a cystic lesion in the upper pole of the right kidney again identified and stable from the recent exam. This is been evaluated on recent MRI with evidence of  a mural nodule. The mural nodule is not well appreciated on today's exam and may have represented focal thrombus which has resolved. Additionally lower pole cysts are noted. The largest of these measures 1.3 cm. These are also stable from the prior MR and ultrasound. Very mild fullness of the collecting system is seen. Relative increased echogenicity of the kidney is noted. Left Kidney: Length: 12.5 cm. 2.6 cm cyst is noted within the left kidney also similar to that seen on prior ultrasound and MR. relative increased echogenicity of the kidneys noted. Bladder: Appears normal for degree of bladder distention. IMPRESSION: Bilateral cystic lesions are again identified similar to that seen on prior MRI and ultrasound. The only significant difference is the absence of the mural nodule seen in the upper pole cystic lesion on the right. This may have represented thrombus which has resolved. Increased echogenicity consistent with medical renal disease. Electronically Signed: By: [place] Catalina M.D. On: 12/16/2015 21:43   Dg Abdomen Acute W/chest  Result Date: 12/16/2015 CLINICAL DATA:  Tarry stools for several days EXAM: DG ABDOMEN ACUTE W/ 1V CHEST COMPARISON:  11/26/2015. FINDINGS:  Cardiac shadow is stable. The lungs are well aerated bilaterally. Mild interstitial changes are noted without focal infiltrate or sizable effusion. Scattered large and small bowel gas is noted. No free air is seen. No abnormal mass or abnormal calcifications are noted. Degenerative changes of the lumbar spine are seen. Postsurgical changes are noted. IMPRESSION: Chronic interstitial changes within the lungs. No acute abnormality in the abdomen. Electronically Signed   By: [place] Catalina M.D.   On: 12/16/2015 18:29    Scheduled Meds: . atorvastatin  80 mg Oral Daily  . carvedilol  25 mg Oral BID WC  . donepezil  10 mg Oral QHS  . ferrous sulfate  325 mg Oral BID  . folic acid  1 mg Oral Daily  . insulin aspart  0-9 Units Subcutaneous Q4H  . levothyroxine  25 mcg Oral QAC breakfast  . montelukast  10 mg Oral QHS  . [START ON 12/20/2015] pantoprazole  40 mg Intravenous Q12H  . sodium bicarbonate  1,300 mg Oral BID  . sodium chloride flush  3 mL Intravenous Q12H    Continuous Infusions: . sodium chloride    . pantoprozole (PROTONIX) infusion 8 mg/hr (12/17/15 1412)     LOS: 1 day    Annita Brod, MD [facility] Pager [phone]  If 7PM-7AM, please contact night-coverage [email or web address] Password [facility] 12/17/2015, 2:33 PM

## 2015-12-17 NOTE — Progress Notes (Signed)
Notified MD Baltazar Najjar numerous times about several pauses, MD gave new orders.

## 2015-12-17 NOTE — Clinical Social Work Note (Signed)
Clinical Social Work Assessment  Patient Details  Name: Dillon Thomas MRN: [id number] Date of Birth: [birth year]  Date of referral:  12/17/15               Reason for consult:  Discharge Planning (admitted from SNF: [facility])                Permission sought to share information with:  Case Manager, Customer service manager, Family Supports Permission granted to share information::  Yes, Verbal Permission Granted  Name::        Agency::  [facility]  Relationship::  Santiago Glad  (significant other)  Contact Information:     Housing/Transportation Living arrangements for the past 2 months:  [place] of Information:  Medical Team, Tourist information centre manager, Spouse Patient Interpreter Needed:  None Criminal Activity/Legal Involvement Pertinent to Current Situation/Hospitalization:  No - Comment as needed Significant Relationships:  Adult Children, Other Family Members, Significant Other Lives with:  Facility Resident Do you feel safe going back to the place where you live?  Yes Need for family participation in patient care:  Yes (Comment)  Care giving concerns:  Patient currently in a procedure and unable to complete assessment.  Call placed to patient's significant other Santiago Glad to discuss care concerns and needs.  Patient was admitted to [facility] 11/17, after he was unable to return home once completing CIR.  Santiago Glad reports patient was doing well, getting stronger, however this has been a set back.  Santiago Glad reports at this time patient still cannot return home as he is not ready and plan will be for him to return. She is working with [facility] to hold his bed and once medically stable would want to return to SNF.   Social Worker assessment / plan:  LCSW completed assessment with family due to patient being procedure.  LCSW explained role and reason for consult.  Plan will be for patient to return to SNF once medically stable. Significant other and his daughter are  involved in care and will assist patient to returning. LCSW available and will continue to follow patient while in the hospital.  Employment status:  Disabled (Comment on whether or not currently receiving Disability), Retired Forensic scientist:  Medicare PT Recommendations:  [place] / Referral to community resources:  [place]  Patient/Family's Response to care:  Agreeable with disposition  Patient/Family's Understanding of and Emotional Response to Diagnosis, Current Treatment, and Prognosis:  Santiago Glad seems to understand and be involved in course of treatment. She was unaware of his procedure today, but optimistic that they will be able to identify the source of his continued bleeds.  Emotional Assessment Appearance:  Appears stated age Attitude/Demeanor/Rapport:  Unable to Assess Affect (typically observed):  Unable to Assess Orientation:  Oriented to Self, Oriented to Place, Oriented to  Time, Oriented to Situation Alcohol / Substance use:  Not Applicable Psych involvement (Current and /or in the community):  No (Comment)  Discharge Needs  Concerns to be addressed:  No discharge needs identified Readmission within the last 30 days:  No Current discharge risk:  None Barriers to Discharge:  No Barriers Identified, Continued Medical Work up   [place], LCSW 12/17/2015, 12:19 PM

## 2015-12-18 ENCOUNTER — Encounter (HOSPITAL_COMMUNITY): Payer: Self-pay | Admitting: Internal Medicine

## 2015-12-18 DIAGNOSIS — E039 Hypothyroidism, unspecified: Secondary | ICD-10-CM

## 2015-12-18 LAB — BASIC METABOLIC PANEL
Anion gap: 8 (ref 5–15)
BUN: 59 mg/dL — ABNORMAL HIGH (ref 6–20)
CALCIUM: 7.6 mg/dL — AB (ref 8.9–10.3)
CHLORIDE: 105 mmol/L (ref 101–111)
CO2: 26 mmol/L (ref 22–32)
CREATININE: 4.38 mg/dL — AB (ref 0.61–1.24)
GFR, EST AFRICAN AMERICAN: 15 mL/min — AB (ref >60)
GFR, EST NON AFRICAN AMERICAN: 13 mL/min — AB (ref >60)
Glucose, Bld: 120 mg/dL — ABNORMAL HIGH (ref 65–99)
Potassium: 3.7 mmol/L (ref 3.5–5.1)
SODIUM: 139 mmol/L (ref 135–145)

## 2015-12-18 LAB — TYPE AND SCREEN
BLOOD PRODUCT EXPIRATION DATE: 201712062359
BLOOD PRODUCT EXPIRATION DATE: 201712092359
ISSUE DATE / TIME: 201711292210
ISSUE DATE / TIME: 201711300114
UNIT TYPE AND RH: 6200
Unit Type and Rh: 6200

## 2015-12-18 LAB — GLUCOSE, CAPILLARY
GLUCOSE-CAPILLARY: 124 mg/dL — AB (ref 65–99)
Glucose-Capillary: 120 mg/dL — ABNORMAL HIGH (ref 65–99)
Glucose-Capillary: 120 mg/dL — ABNORMAL HIGH (ref 65–99)
Glucose-Capillary: 129 mg/dL — ABNORMAL HIGH (ref 65–99)

## 2015-12-18 LAB — HEMOGLOBIN AND HEMATOCRIT, BLOOD
HEMATOCRIT: 24.7 % — AB (ref 39.0–52.0)
HEMOGLOBIN: 7.7 g/dL — AB (ref 13.0–17.0)

## 2015-12-18 MED ORDER — SODIUM BICARBONATE 650 MG PO TABS: 1300.0000 mg | ORAL_TABLET | Freq: Two times a day (BID) | ORAL | 2 refills | Status: AC

## 2015-12-18 MED ORDER — ZOLPIDEM TARTRATE 5 MG PO TABS
5.0000 mg | ORAL_TABLET | Freq: Once | ORAL | Status: AC
  Administered 2015-12-18: 5 mg via ORAL
  Filled 2015-12-18: qty 1

## 2015-12-18 MED ORDER — ATORVASTATIN CALCIUM 80 MG PO TABS: 80.0000 mg | ORAL_TABLET | Freq: Every day | ORAL | 2 refills | Status: AC

## 2015-12-18 MED ORDER — ZOLPIDEM TARTRATE 5 MG PO TABS: 5.0000 mg | ORAL_TABLET | Freq: Every evening | ORAL | 0 refills | Status: DC | PRN

## 2015-12-18 MED ORDER — CARVEDILOL 25 MG PO TABS: 25.0000 mg | ORAL_TABLET | Freq: Two times a day (BID) | ORAL | 2 refills | Status: AC

## 2015-12-18 MED ORDER — CITALOPRAM HYDROBROMIDE 10 MG PO TABS: 10.0000 mg | ORAL_TABLET | Freq: Every day | ORAL | 0 refills | Status: AC

## 2015-12-18 MED ORDER — LOPERAMIDE HCL 2 MG PO CAPS
2.0000 mg | ORAL_CAPSULE | Freq: Two times a day (BID) | ORAL | Status: DC
  Administered 2015-12-18: 2 mg via ORAL
  Filled 2015-12-18: qty 1

## 2015-12-18 MED ORDER — PRO-STAT SUGAR FREE PO LIQD: 30.0000 mL | Freq: Two times a day (BID) | ORAL | 0 refills | Status: AC

## 2015-12-18 MED ORDER — PANTOPRAZOLE SODIUM 40 MG IV SOLR
40.0000 mg | Freq: Two times a day (BID) | INTRAVENOUS | Status: DC
  Administered 2015-12-18: 40 mg via INTRAVENOUS
  Filled 2015-12-18: qty 40

## 2015-12-18 NOTE — Discharge Summary (Addendum)
Discharge Summary  Dillon Thomas NKN:[id number] DOB: [birth year]  PCP: [name], MD  Admit date: 12/16/2015 Discharge date: 12/18/2015  Time spent: 35 minutes   Recommendations for Outpatient Follow-up:  1. Patient will follow up with [facility] in the next 2-4 weeks 2. Patient will follow-up with Dr. Adrian Prows, cardiology next 4 weeks 3. Patient return back to skilled nursing  4. New medication: Ambien 5 mg by mouth daily at bedtime when necessary for sleep 5. New medication: Celexa 10 mg by mouth daily  Discharge Diagnoses:  Active Hospital Problems   Diagnosis Date Noted  . Gastric erosion, acute   . Upper GI bleed 12/16/2015  . GI bleed 12/16/2015  . Anemia of chronic disease   . Acute blood loss anemia   . Insulin dependent diabetes mellitus ([place]) 11/15/2015  . AKI (acute kidney injury) ([place]) 11/15/2015  . Chronic kidney disease (CKD), stage IV (severe) ([place]) 11/15/2015    Resolved Hospital Problems   Diagnosis Date Noted Date Resolved  No resolved problems to display.    Discharge Condition: Improved, being discharged back to skilled nursing   Diet recommendation: Heart healthy   Vitals:   12/18/15 0425 12/18/15 1302  BP: (!) 156/63 (!) 158/65  Pulse: 77 81  Resp: 18 18  Temp: 98.7 F (37.1 C) 98 F (36.7 C)    History of present illness:   Patient is a 65 year old male past mental history of diabetes mellitus and stage IV chronic kidney disease and CAD who was discharged from the hospitalist service less than a month ago with hyperglycemia and complicated by a non-STEMI and patient was discharged to a skilled nursing facility on [address].  Patient's labs were continued to be followed noting a creatinine of 3.6 and hemoglobin of 9 on 11/11. Subsequent labs over the next few days noted a downward trend in his hemoglobin and an increase in his creatinine. On 11/29, patient had blood work done which noted a hemoglobin of 6.7 and a  creatinine of 4.54. Patient was sent over to the emergency room which confirmed these labs. Emergency room he is also noted to be Hemoccult positive. He was ordered 2 units of packed red blood cells. Gastroenterology was consulted.  Hospital Course:  Active Problems:   AKI (acute kidney injury) ([place])   Acute kidney injury in the setting of Chronic kidney disease (CKD), stage IV (severe) ([place]): Acute kidney injury likely from patient's diuretics rather than blood loss. Discussed with nephrology. They will see the patient as an outpatient in the next 2-4 weeks. Patient was receiving all of his medical care in [place], but recently relocated here in the last month.    Insulin dependent diabetes mellitus ([place]): Stable during this hospitalization  Stage I decubitus ulcer on back side plus bilateral heel ulcers: All present on admission. Patient has been slowly increasing strength from his last hospitalization was in working with rehabilitation to become more mobile. Encouraged ambulation and dressing covers for these wounds.    Gastric erosion, acute in the setting of anemia of chronic disease: Patient seen by GI taken for endoscopy. Following 2 unit transfusion, hemoglobin up to 7.6 and creatinine slightly improved to 4.37. EGD was found to be normal with the exception of some small erosions which were Heme positive stool. No true melena. It was felt that Asians anemia may be more from chronic disease from his renal failure. It is recommended that as long as he is on aspirin or NSAIDs,  continue his PPI and that he should establish with nephrology as he likely will need Epogen in the future. GI gave okay to restart Brilinta.  Patient has been reporting more stress and depression with his worsening medical issues. Have started Celexa  Hypothyroidism: Continue on Synthroid   Consultants:  [facility]   Procedures:  EGD: Normal EGD, several erosions noted   Discharge Exam: BP (!) 158/65  (BP Location: Left Arm)   Pulse 81   Temp 98 F (36.7 C) (Oral)   Resp 18   Ht 5' 11"  (1.803 m)   Wt 95.3 kg (210 lb)   SpO2 94%   BMI 29.29 kg/m   General: Alert and oriented 3, no acute distress  Cardiovascular: Regular rate and rhythm, X4-J2, 2/6 systolic ejection murmur  Respiratory: Clear to auscultation bilaterally   Discharge Instructions You were cared for by a hospitalist during your hospital stay. If you have any questions about your discharge medications or the care you received while you were in the hospital after you are discharged, you can call the unit and asked to speak with the hospitalist on call if the hospitalist that took care of you is not available. Once you are discharged, your primary care physician will handle any further medical issues. Please note that NO REFILLS for any discharge medications will be authorized once you are discharged, as it is imperative that you return to your primary care physician (or establish a relationship with a primary care physician if you do not have one) for your aftercare needs so that they can reassess your need for medications and monitor your lab values.  Discharge Instructions    Diet - low sodium heart healthy    Complete by:  As directed    Increase activity slowly    Complete by:  As directed        Medication List    TAKE these medications   amLODipine 10 MG tablet Commonly known as:  NORVASC Take 10 mg by mouth daily.   aspirin 81 MG EC tablet Take 1 tablet (81 mg total) by mouth daily.   atorvastatin 80 MG tablet Commonly known as:  LIPITOR Take 1 tablet (80 mg total) by mouth daily. Start taking on:  12/19/2015   carvedilol 25 MG tablet Commonly known as:  COREG Take 1 tablet (25 mg total) by mouth 2 (two) times daily with a meal.   citalopram 10 MG tablet Commonly known as:  CELEXA Take 1 tablet (10 mg total) by mouth daily.   DECUBI-VITE PO Take 1 tablet by mouth daily.   donepezil 10 MG  tablet Commonly known as:  ARICEPT Take 1 tablet (10 mg total) by mouth at bedtime.   feeding supplement (PRO-STAT SUGAR FREE 64) Liqd Take 30 mLs by mouth 2 (two) times daily.   ferrous sulfate 325 (65 FE) MG tablet Take 325 mg by mouth 2 (two) times daily.   folic acid 1 MG tablet Commonly known as:  FOLVITE Take 1 tablet (1 mg total) by mouth daily.   hydrALAZINE 50 MG tablet Commonly known as:  APRESOLINE Take 50 mg by mouth every 8 (eight) hours.   insulin glargine 100 UNIT/ML injection Commonly known as:  LANTUS Inject 15 Units into the skin daily.   insulin lispro 100 UNIT/ML injection Commonly known as:  HUMALOG Inject 0.05-0.1 mLs (5-10 Units total) into the skin 3 (three) times daily before meals. Per sliding scale < 150 = 0 units; 150-250 = 5 units; 251-300 =  8 units; 301-350 = 10 units and > 350 notify provider.   levothyroxine 25 MCG tablet Commonly known as:  SYNTHROID, LEVOTHROID Take 1 tablet (25 mcg total) by mouth daily before breakfast.   Melatonin 3 MG Tabs Take 1 tablet (3 mg total) by mouth at bedtime.   montelukast 10 MG tablet Commonly known as:  SINGULAIR Take 10 mg by mouth at bedtime.   pantoprazole 40 MG tablet Commonly known as:  PROTONIX Take 1 tablet (40 mg total) by mouth daily.   sodium bicarbonate 650 MG tablet Take 2 tablets (1,300 mg total) by mouth 2 (two) times daily.   ticagrelor 90 MG Tabs tablet Commonly known as:  BRILINTA Take 1 tablet (90 mg total) by mouth 2 (two) times daily.   torsemide 20 MG tablet Commonly known as:  DEMADEX Take 20 mg by mouth 2 (two) times daily.   zolpidem 5 MG tablet Commonly known as:  AMBIEN Take 1 tablet (5 mg total) by mouth at bedtime as needed for sleep.       Allergies  Allergen Reactions  . Penicillins Other (See Comments)    Unknown  . Tetracyclines & Related Other (See Comments)    Unknown   Follow-up Information    Olmedo, Guy Begin, MD Follow up.   Specialty:   Family Medicine Contact information: [place] [address] [phone]            The results of significant diagnostics from this hospitalization (including imaging, microbiology, ancillary and laboratory) are listed below for reference.    Significant Diagnostic Studies: Dg Chest 2 View  Result Date: 11/24/2015 CLINICAL DATA:  Fluid overload, pain left posterior chest region EXAM: CHEST  2 VIEW COMPARISON:  11/15/2015, 01/09/2013 FINDINGS: Mildly elevated left diaphragm. On the lateral view, there is hazy posterior lung opacification possibly related to an effusion, likely on the left side. Heart size is normal. Pulmonary vascularity within normal limits. No pneumothorax. IMPRESSION: Probable left-sided pleural effusion, best seen on lateral view with atelectasis or infiltrate at the left lung base. Electronically Signed   By: Donavan Foil M.D.   On: 11/24/2015 22:10   Mr Abdomen Wo Contrast  Result Date: 11/21/2015 CLINICAL DATA:  66 year old male inpatient with renal failure and indeterminate cystic right renal mass on ultrasound, presenting for further characterization. EXAM: MRI ABDOMEN WITHOUT CONTRAST TECHNIQUE: Multiplanar multisequence MR imaging was performed without the administration of intravenous contrast. COMPARISON:  11/19/2015 renal sonogram and 12/15/2014 unenhanced CT abdomen/ pelvis. FINDINGS: Study is significantly motion degraded. Lower chest: Moderate left pleural effusion. Hepatobiliary: Normal liver size and configuration. There is prominent iron deposition throughout the liver. No liver mass. Cholecystectomy. Bile ducts are within expected post cholecystectomy limits with mild central intrahepatic biliary ductal dilatation and common bile duct diameter 8 mm. No choledocholithiasis. Pancreas: No pancreatic duct dilation. There is a unilocular lobulated 2.0 x 1.1 x 1.1 cm cystic pancreatic lesion in the pancreatic head (series 7/ image 33). No  additional pancreatic lesions. No evidence of pancreas divisum. Spleen: Normal size. No mass. There is prominent iron deposition throughout the spleen. Adrenals/Urinary Tract: No discrete adrenal nodules. No hydronephrosis. There is a 3.2 x 2.7 x 2.8 cm renal cortical mass in the upper right kidney (series 6/ image 66), which demonstrates heterogeneous T1 hyperintensity and mixed T2 signal intensity, with apparent 1.8 cm posterior mural nodule. There are similar appearing partially exophytic complex renal cysts in both kidneys, measuring 3.4 x 3.1 cm in the posterior  lower right kidney (series 7/image 51) and 2.8 x 2.4 cm in the lateral interpolar left kidney (series 7/image 43), both demonstrating T1 hyperintensity and fluid debris levels on T2 imaging. Additional subcentimeter T2 hyperintense simple appearing renal cysts are present in both kidneys . Stomach/Bowel: Grossly normal stomach. Visualized small and large bowel is normal caliber, with no bowel wall thickening. Vascular/Lymphatic: There is apparent contrast material within the blood pool, possibly from recent administration of contrast given the provided history of renal failure (IV contrast was not administered on the scan). Atherosclerotic nonaneurysmal abdominal aorta. Retrocaval adenopathy measuring up to 1.8 cm (series 7/ image 41), increased from 1.4 cm on 12/15/2014. Left para-aortic adenopathy measuring up to 02.0 cm (series 7/image 48), increased from 1.7 cm on 12/15/2014. Other: No abdominal ascites or focal fluid collection. Musculoskeletal: No aggressive appearing focal osseous lesions. IMPRESSION: 1. Limited motion degraded noncontrast MRI. 2. Indeterminate complex hemorrhagic/proteinaceous 3.2 cm renal cortical mass in the upper right kidney with apparent 1.8 cm mural nodule, cannot exclude renal cell carcinoma. Renal mass protocol MRI or CT of the abdomen without and with IV contrast is necessary for further characterization, when  clinically feasible. 3. Additional indeterminate hemorrhagic/proteinaceous renal cortical masses in the posterior lower right kidney and lateral interpolar left kidney. 4. Retroperitoneal lymphadenopathy has increased mildly since 12/15/2014, and is suspicious for a lymphoproliferative disorder. Recommend correlation with clinical history and laboratory evaluation for possible lymphoproliferative disorder. PET-CT may be useful for further characterization and potential biopsy planning. 5. Unilocular 2.0 cm cystic pancreatic lesion in the pancreatic head without main pancreatic duct dilation, incompletely characterized on this noncontrast study. Pancreas protocol MRI abdomen without and with IV contrast is recommended in 6 months. This recommendation follows ACR consensus guidelines: Management of Incidental Pancreatic Cysts: A White Paper of the ACR Incidental Findings Committee. [place] 5631;[id number]. 6. Moderate left pleural effusion. 7. Prominent iron deposition throughout the liver and spleen, probably transfusion related. Electronically Signed   By: Ilona Sorrel M.D.   On: 11/21/2015 13:01   US Renal  Addendum Date: 12/16/2015   ADDENDUM REPORT: 12/16/2015 21:56 ADDENDUM: Should be noted a stable left-sided pleural effusion is noted as well. Electronically Signed   By: Inez Catalina M.D.   On: 12/16/2015 21:56   Result Date: 12/16/2015 CLINICAL DATA:  Elevated creatinine EXAM: RENAL / URINARY TRACT ULTRASOUND COMPLETE COMPARISON:  11/19/2015, 11/21/2015 FINDINGS: Right Kidney: Length: 12.3 cm. There is a cystic lesion in the upper pole of the right kidney again identified and stable from the recent exam. This is been evaluated on recent MRI with evidence of a mural nodule. The mural nodule is not well appreciated on today's exam and may have represented focal thrombus which has resolved. Additionally lower pole cysts are noted. The largest of these measures 1.3 cm. These are also stable from  the prior MR and ultrasound. Very mild fullness of the collecting system is seen. Relative increased echogenicity of the kidney is noted. Left Kidney: Length: 12.5 cm. 2.6 cm cyst is noted within the left kidney also similar to that seen on prior ultrasound and MR. relative increased echogenicity of the kidneys noted. Bladder: Appears normal for degree of bladder distention. IMPRESSION: Bilateral cystic lesions are again identified similar to that seen on prior MRI and ultrasound. The only significant difference is the absence of the mural nodule seen in the upper pole cystic lesion on the right. This may have represented thrombus which has resolved. Increased echogenicity consistent with medical  renal disease. Electronically Signed: By: Inez Catalina M.D. On: 12/16/2015 21:43   US Renal  Result Date: 11/19/2015 CLINICAL DATA:  Acute renal insufficiency EXAM: RENAL / URINARY TRACT ULTRASOUND COMPLETE COMPARISON:  None. FINDINGS: Right Kidney: Length: 12.2 cm. Echogenicity is increased. Renal cortical thickness is normal. No perinephric fluid or hydronephrosis visualized. There is a cystic mass arising from the upper pole of the right kidney measuring 2.5 x 2.8 x 2.2 cm. Within this cystic mass, there is a solid-appearing focus measuring 1.4 x 1.2 x 1.2 cm. There is no sonographically demonstrable calculus or ureterectasis. Left Kidney: Length: 11.6 cm. Echogenicity is increased. Renal cortical thickness is normal. No perinephric fluid or hydronephrosis visualized. There is a cyst arising from the left kidney measuring 3.0 x 2.6 x 2.8 cm. No sonographically demonstrable calculus or ureterectasis. Bladder: Appears normal for degree of bladder distention. IMPRESSION: Kidneys are echogenic consistent with medical renal disease. No obstructing focus identified on either side. There is a cystic lesion arising from the upper pole of the right kidney which contains a solid focus. This appearance raises concern for  potential renal neoplasm with a cystic component. Further evaluation with pre and post contrast MRI or CT should be considered. MRI is preferred in younger patients (due to lack of ionizing radiation) and for evaluating calcified lesion(s). Given the elevated creatinine, MRI may be a better choice for further assessment if there is no contraindication to MR imaging. There is a simple cyst in the left kidney. These results will be called to the ordering clinician or representative by the Radiologist Assistant, and communication documented in the PACS or zVision Dashboard. Electronically Signed   By: Lowella Grip III M.D.   On: 11/19/2015 14:57   Dg Abdomen Acute W/chest  Result Date: 12/16/2015 CLINICAL DATA:  Tarry stools for several days EXAM: DG ABDOMEN ACUTE W/ 1V CHEST COMPARISON:  11/26/2015. FINDINGS: Cardiac shadow is stable. The lungs are well aerated bilaterally. Mild interstitial changes are noted without focal infiltrate or sizable effusion. Scattered large and small bowel gas is noted. No free air is seen. No abnormal mass or abnormal calcifications are noted. Degenerative changes of the lumbar spine are seen. Postsurgical changes are noted. IMPRESSION: Chronic interstitial changes within the lungs. No acute abnormality in the abdomen. Electronically Signed   By: Inez Catalina M.D.   On: 12/16/2015 18:29   Dg Bone Survey Met  Addendum Date: 11/26/2015   ADDENDUM REPORT: 11/26/2015 18:37 ADDENDUM: Bilateral twelfth rib fractures noted. Electronically Signed   By: Nolon Nations M.D.   On: 11/26/2015 18:37   Result Date: 11/26/2015 CLINICAL DATA:  Elevated serum immunoglobulin free light chains. Pt could not stand or roll onto his side. Best obtainable images due to pt condition. EXAM: METASTATIC BONE SURVEY COMPARISON:  Chest x-ray 11/24/2015, abdominal film 09/16/2015 FINDINGS: Images of the axial and appendicular skeleton are performed. There are acute fractures of the left fourth and  fifth ribs. Heart size is normal. There is dense opacity in the left lower lobe and left pleural effusion. Evaluation of the spine is limited by limited patient mobility. Degenerative changes are identified in the mid cervical spine. There is no acute fracture identified. Degenerative changes are identified throughout the thoracic and lumbar spine. There is vertebral body height loss at L3, likely chronic based on its appearance. Deformity of the left superior pubic ramus and possibly left inferior pubic ramus consistent with fracture. No suspicious lytic or blastic lesions are identified. There is dense  atherosclerotic calcification of the imaged vessels. Incidental note of subcutaneous edema. Surgical clips are noted in the right upper quadrant of the abdomen. IMPRESSION: 1. No suspicious lytic or blastic lesions. 2. Acute fractures of the left fourth and fifth ribs. 3. Acute fractures of the left superior and possibly inferior pubic ramus. 4. Atherosclerotic disease.  Subcutaneous edema. Electronically Signed: By: Nolon Nations M.D. On: 11/26/2015 15:29   Dg Swallowing Func-speech Pathology  Result Date: 11/19/2015 Objective Swallowing Evaluation: Type of Study: MBS-Modified Barium Swallow Study Patient Details Name: Dillon Thomas MRN: [id number] Date of Birth: 05/29/1950 Today's Date: 11/19/2015 Time: SLP Start Time (ACUTE ONLY): 1110-SLP Stop Time (ACUTE ONLY): 1132 SLP Time Calculation (min) (ACUTE ONLY): 22 min Past Medical History: Past Medical History: Diagnosis Date . BPH (benign prostatic hyperplasia)  . Chronic kidney disease  . Depression  . Diabetes mellitus without complication ([place])  . GERD (gastroesophageal reflux disease)  . Hyperlipidemia  . Hypertension  . Hypothyroidism  . TIA (transient ischemic attack)  Past Surgical History: Past Surgical History: Procedure Laterality Date . APPENDECTOMY   . CHOLECYSTECTOMY   . CYSTOURETHROSCOPY   . POLYPECTOMY    Vocal cord polyp removed . VASECTOMY    HPI: 65 yo male with hx uncontrolled DM, CKD 3, GERD, HTN, previous TIA, early dementia presented 10/30 with 1 week hx progressive weakness then found this am by his girlfriend/caregiver with AMS, unable to speak or get up. In ER was noted to have blood glucose >800, AKI with Scr 3.5, metabolic acidosis, HTN with SBP 200. He was admitted by [facility] with DKA/HHNK but there was some concern for CVA and MRI was ordered. In MRI pt had episode of emesis, study was aborted.  Subjective: alert Assessment / Plan / Recommendation CHL IP CLINICAL IMPRESSIONS 11/19/2015 Therapy Diagnosis Moderate pharyngeal phase dysphagia;Mild cervical esophageal phase dysphagia Clinical Impression Pt demonstrates a mild to moderate oropharyngeal dysphagia with mild motor and sensory deficits. The pts swallow is slightly delayed, combined with mildly decreased mobility of the hyolaryngeal complex. This results in penetration before, during and after the swallow, sometimes aspiration if the bolus is large. Sensation is absent in the laryngeal vestibule and delayed in the subglottis. Mild to moderate residuals with solids and liquids also result in penetration events post swallow. Positional changes were ineffective and, with a chin tuck, actually blocked the passage to the bolus, likely due to appearance of bony protrusions on the cervical esophagus at the level of the UES that impede bolus transit. This may also impact hyolaryngeal mobility. Best strategies to reduce aspiration risk are two swallows and intermittent throat clearing, following solids with liquids and taking small sips, no straws. Offered verbal and written instruction to pt. Will f/u for further training with a regular diet and thin liquids.  Impact on safety and function Moderate aspiration risk   CHL IP TREATMENT RECOMMENDATION 11/19/2015 Treatment Recommendations Therapy as outlined in treatment plan below   Prognosis 11/19/2015 Prognosis for Safe Diet Advancement Good  Barriers to Reach Goals -- Barriers/Prognosis Comment -- CHL IP DIET RECOMMENDATION 11/19/2015 SLP Diet Recommendations Regular solids;Thin liquid Liquid Administration via Cup;No straw Medication Administration Whole meds with puree Compensations Slow rate;Small sips/bites;Follow solids with liquid;Multiple dry swallows after each bite/sip;Clear throat intermittently Postural Changes Seated upright at 90 degrees   CHL IP OTHER RECOMMENDATIONS 11/19/2015 Recommended Consults -- Oral Care Recommendations Oral care BID Other Recommendations --   CHL IP FOLLOW UP RECOMMENDATIONS 11/19/2015 Follow up Recommendations Inpatient Rehab   [facility]  IP FREQUENCY AND DURATION 11/19/2015 Speech Therapy Frequency (ACUTE ONLY) min 2x/week Treatment Duration 2 weeks      CHL IP ORAL PHASE 11/19/2015 Oral Phase WFL Oral - Pudding Teaspoon -- Oral - Pudding Cup -- Oral - Honey Teaspoon -- Oral - Honey Cup -- Oral - Nectar Teaspoon -- Oral - Nectar Cup -- Oral - Nectar Straw -- Oral - Thin Teaspoon -- Oral - Thin Cup -- Oral - Thin Straw -- Oral - Puree -- Oral - Mech Soft -- Oral - Regular -- Oral - Multi-Consistency -- Oral - Pill -- Oral Phase - Comment --  CHL IP PHARYNGEAL PHASE 11/19/2015 Pharyngeal Phase Impaired Pharyngeal- Pudding Teaspoon -- Pharyngeal -- Pharyngeal- Pudding Cup -- Pharyngeal -- Pharyngeal- Honey Teaspoon -- Pharyngeal -- Pharyngeal- Honey Cup -- Pharyngeal -- Pharyngeal- Nectar Teaspoon -- Pharyngeal -- Pharyngeal- Nectar Cup -- Pharyngeal -- Pharyngeal- Nectar Straw -- Pharyngeal -- Pharyngeal- Thin Teaspoon -- Pharyngeal -- Pharyngeal- Thin Cup Delayed swallow initiation-pyriform sinuses;Reduced laryngeal elevation;Reduced anterior laryngeal mobility;Pharyngeal residue - valleculae;Pharyngeal residue - pyriform;Penetration/Aspiration before swallow;Penetration/Aspiration during swallow;Penetration/Apiration after swallow;Trace aspiration;Compensatory strategies attempted (with notebox) Pharyngeal Material does not  enter airway;Material enters airway, remains ABOVE vocal cords and not ejected out;Material enters airway, CONTACTS cords and not ejected out;Material enters airway, passes BELOW cords then ejected out;Material enters airway, passes BELOW cords without attempt by patient to eject out (silent aspiration) Pharyngeal- Thin Straw Delayed swallow initiation-pyriform sinuses;Reduced laryngeal elevation;Reduced anterior laryngeal mobility;Pharyngeal residue - valleculae;Pharyngeal residue - pyriform;Penetration/Aspiration before swallow;Penetration/Aspiration during swallow;Penetration/Apiration after swallow;Trace aspiration;Compensatory strategies attempted (with notebox) Pharyngeal Material enters airway, passes BELOW cords without attempt by patient to eject out (silent aspiration);Material enters airway, passes BELOW cords then ejected out Pharyngeal- Puree Reduced laryngeal elevation;Reduced anterior laryngeal mobility;Pharyngeal residue - valleculae;Pharyngeal residue - pyriform Pharyngeal Material does not enter airway Pharyngeal- Mechanical Soft -- Pharyngeal -- Pharyngeal- Regular Reduced laryngeal elevation;Reduced anterior laryngeal mobility;Pharyngeal residue - valleculae;Pharyngeal residue - pyriform Pharyngeal -- Pharyngeal- Multi-consistency -- Pharyngeal -- Pharyngeal- Pill -- Pharyngeal -- Pharyngeal Comment Delayed swallow initiation-pyriform sinuses;Reduced laryngeal elevation;Reduced anterior laryngeal mobility;Pharyngeal residue - valleculae;Pharyngeal residue - pyriform;Penetration/Aspiration before swallow;Penetration/Aspiration during swallow;Penetration/Apiration after swallow;Trace aspiration;Compensatory strategies attempted (with notebox)  CHL IP CERVICAL ESOPHAGEAL PHASE 11/19/2015 Cervical Esophageal Phase Impaired Pudding Teaspoon -- Pudding Cup -- Honey Teaspoon -- Honey Cup -- Nectar Teaspoon -- Nectar Cup -- Nectar Straw -- Thin Teaspoon -- Thin Cup -- Thin Straw -- Puree -- Mechanical  Soft -- Regular -- Multi-consistency -- Pill -- Cervical Esophageal Comment With a chin tuck, thin liquids would not pass the cervical esophagus, likely due to bony protrusion on anterior cervical spine CHL IP GO 08/21/2015 Functional Assessment Tool Used Western Aphasia Battery- REvised, clinical judgment Functional Limitations Spoken language expressive Swallow Current Status [phone]) (None) Swallow Goal Status (M0349) (None) Swallow Discharge Status (Z7915) (None) Motor Speech Current Status [phone]) (None) Motor Speech Goal Status [phone]) (None) Motor Speech Goal Status (K5537) (None) Spoken Language Comprehension Current Status [phone]) (None) Spoken Language Comprehension Goal Status (B8675) (None) Spoken Language Comprehension Discharge Status [phone]) (None) Spoken Language Expression Current Status (F0071) CJ Spoken Language Expression Goal Status (Q1975) CI Spoken Language Expression Discharge Status [phone]) (None) Attention Current Status (Q9826) (None) Attention Goal Status (E1583) (None) Attention Discharge Status (E9407) (None) Memory Current Status (W8088) (None) Memory Goal Status (P1031) (None) Memory Discharge Status (R9458) (None) Voice Current Status (P9292) (None) Voice Goal Status (K4628) (None) Voice Discharge Status (M3817) (None) Other Speech-Language Pathology Functional Limitation (R1165) (None) Other Speech-Language Pathology Functional Limitation Goal  Status [phone]) (None) Other Speech-Language Pathology Functional Limitation Discharge Status [phone]) (None) Herbie Baltimore, MA CCC-SLP [phone] Othelia Pulling Katherene Ponto 11/19/2015, 11:57 AM               Microbiology: No results found for this or any previous visit (from the past 240 hour(s)).   Labs: Basic Metabolic Panel:  Recent Labs Lab 12/16/15 1638 12/17/15 0556 12/18/15 0519  NA 138 139 139  K 3.7 3.7 3.7  CL 105 105 105  CO2 24 24 26   GLUCOSE 156* 100* 120*  BUN 62* 63* 59*  CREATININE 4.54* 4.37* 4.38*  CALCIUM 8.2*  8.1* 7.6*   Liver Function Tests:  Recent Labs Lab 12/16/15 1638 12/17/15 0556  AST 15 15  ALT 13* 12*  ALKPHOS 91 90  BILITOT 0.8 1.3*  PROT 6.6 6.2*  ALBUMIN 2.1* 2.0*   No results for input(s): LIPASE, AMYLASE in the last 168 hours. No results for input(s): AMMONIA in the last 168 hours. CBC:  Recent Labs Lab 12/16/15 1638 12/17/15 0556 12/17/15 1710 12/18/15 0519  WBC 8.3 7.6  --   --   NEUTROABS 3.2  --   --   --   HGB 6.7* 7.6* 8.1* 7.7*  HCT 21.3* 23.4* 25.7* 24.7*  MCV 91.0 91.8  --   --   PLT 349 313  --   --    Cardiac Enzymes: No results for input(s): CKTOTAL, CKMB, CKMBINDEX, TROPONINI in the last 168 hours. BNP: BNP (last 3 results)  Recent Labs  11/15/15 2157  BNP 597.3*    ProBNP (last 3 results) No results for input(s): PROBNP in the last 8760 hours.  CBG:  Recent Labs Lab 12/17/15 2057 12/18/15 0004 12/18/15 0420 12/18/15 0733 12/18/15 1149  GLUCAP 197* 120* 120* 124* 129*       Signed:  Annita Brod, MD [facility] 12/18/2015, 1:22 PM

## 2015-12-18 NOTE — Care Management Note (Signed)
Case Management Note  Patient Details  Name: Dillon [facility] MRN: [id number] Date of Birth: [birth year]  Subjective/Objective:                    Action/Plan:d/c SNF.   Expected Discharge Date:   (unknown)               Expected Discharge Plan:  Skilled Nursing Facility  In-House Referral:  Clinical Social Work  Discharge planning Services  CM Consult  Post Acute Care Choice:    Choice offered to:     DME Arranged:    DME Agency:     HH Arranged:    [facility] Agency:     Status of Service:  Completed, signed off  If discussed at [employer], dates discussed:    Additional Comments:  Dessa Phi, RN 12/18/2015, 1:26 PM

## 2015-12-18 NOTE — Progress Notes (Addendum)
Patient is medically stable to return to SNF today. He will return to [facility]. Call placed and message left to SNF regarding discharge. All paperwork sent to facility for review.  Family has been involved and voiced wanting to take patient back to SNF via car. Confirmed with daughter who is in room and she will take him by car. Call placed to [facility] regarding transportation in which she reports his daughter may want to take him. LCSW will confirm.  No other needs.  Awaiting call back from facility regarding discharge and time to return.  Will complete discharge as soon as facility calls back. (completed. Return to [facility])   Lane Hacker, MSW Clinical Social Work: [employer] Coverage for :  [phone]

## 2015-12-18 NOTE — Progress Notes (Signed)
Pt completed advance directive.  Chaplain notarized.  Copy will be placed in pt chart on unit and copy with medical records to be scanned into EPIC.    [place], [place]

## 2015-12-21 ENCOUNTER — Encounter: Payer: Self-pay | Admitting: Internal Medicine

## 2015-12-21 ENCOUNTER — Non-Acute Institutional Stay (SKILLED_NURSING_FACILITY): Payer: MEDICARE | Admitting: Internal Medicine

## 2015-12-21 ENCOUNTER — Other Ambulatory Visit: Payer: Self-pay | Admitting: *Deleted

## 2015-12-21 DIAGNOSIS — L89152 Pressure ulcer of sacral region, stage 2: Secondary | ICD-10-CM

## 2015-12-21 DIAGNOSIS — R05 Cough: Secondary | ICD-10-CM

## 2015-12-21 DIAGNOSIS — E1142 Type 2 diabetes mellitus with diabetic polyneuropathy: Secondary | ICD-10-CM

## 2015-12-21 DIAGNOSIS — N179 Acute kidney failure, unspecified: Secondary | ICD-10-CM

## 2015-12-21 DIAGNOSIS — D62 Acute posthemorrhagic anemia: Secondary | ICD-10-CM | POA: Diagnosis not present

## 2015-12-21 DIAGNOSIS — L89620 Pressure ulcer of left heel, unstageable: Secondary | ICD-10-CM

## 2015-12-21 DIAGNOSIS — R531 Weakness: Secondary | ICD-10-CM | POA: Diagnosis not present

## 2015-12-21 DIAGNOSIS — R197 Diarrhea, unspecified: Secondary | ICD-10-CM | POA: Diagnosis not present

## 2015-12-21 DIAGNOSIS — G4709 Other insomnia: Secondary | ICD-10-CM

## 2015-12-21 DIAGNOSIS — L89610 Pressure ulcer of right heel, unstageable: Secondary | ICD-10-CM | POA: Diagnosis not present

## 2015-12-21 DIAGNOSIS — R053 Chronic cough: Secondary | ICD-10-CM

## 2015-12-21 DIAGNOSIS — E039 Hypothyroidism, unspecified: Secondary | ICD-10-CM

## 2015-12-21 DIAGNOSIS — I214 Non-ST elevation (NSTEMI) myocardial infarction: Secondary | ICD-10-CM | POA: Diagnosis not present

## 2015-12-21 DIAGNOSIS — K259 Gastric ulcer, unspecified as acute or chronic, without hemorrhage or perforation: Secondary | ICD-10-CM | POA: Diagnosis not present

## 2015-12-21 DIAGNOSIS — E440 Moderate protein-calorie malnutrition: Secondary | ICD-10-CM | POA: Diagnosis not present

## 2015-12-21 DIAGNOSIS — F329 Major depressive disorder, single episode, unspecified: Secondary | ICD-10-CM

## 2015-12-21 DIAGNOSIS — M4712 Other spondylosis with myelopathy, cervical region: Secondary | ICD-10-CM

## 2015-12-21 DIAGNOSIS — F0390 Unspecified dementia without behavioral disturbance: Secondary | ICD-10-CM

## 2015-12-21 DIAGNOSIS — N281 Cyst of kidney, acquired: Secondary | ICD-10-CM

## 2015-12-21 DIAGNOSIS — F32A Depression, unspecified: Secondary | ICD-10-CM

## 2015-12-21 DIAGNOSIS — I10 Essential (primary) hypertension: Secondary | ICD-10-CM

## 2015-12-21 DIAGNOSIS — E1165 Type 2 diabetes mellitus with hyperglycemia: Secondary | ICD-10-CM

## 2015-12-21 DIAGNOSIS — IMO0002 Reserved for concepts with insufficient information to code with codable children: Secondary | ICD-10-CM

## 2015-12-21 DIAGNOSIS — N184 Chronic kidney disease, stage 4 (severe): Secondary | ICD-10-CM

## 2015-12-21 MED ORDER — ZOLPIDEM TARTRATE 5 MG PO TABS: 5.0000 mg | ORAL_TABLET | Freq: Every evening | ORAL | 0 refills | Status: DC | PRN

## 2015-12-21 NOTE — Progress Notes (Signed)
LOCATION: [facility]  PCP: [name], MD   Code Status: Full Code  Goals of care: Advanced Directive information Advanced Directives 12/18/2015  Does Patient Have a Medical Advance Directive? Yes  Type of Paramedic of [place];Living will  Does patient want to make changes to medical advance directive? No - Patient declined  Copy of [place] in Chart? Yes       Extended Emergency Contact Information Primary Emergency Contact: [facility] Address: [address]          [place], [place] [address] [place] Phone: [phone] Mobile Phone: [phone] Relation: Significant other   Allergies  Allergen Reactions  . Penicillins Other (See Comments)    Unknown  . Tetracyclines & Related Other (See Comments)    Unknown    Chief Complaint  Patient presents with  . Readmit To SNF    Readmission Visit     HPI:  Patient is a 65 y.o. male seen today for short term rehabilitation post hospital re-admission from 12/16/15-12/18/15 with worsening renal function and drop in his hemoglobin. He was hemoccult positive and GI was consulted. He received 2 u prbc transfusion. He underwent EGD showing gastric erosion. He was placed on PPI. He was started on medication for depression and insomnia. Of note, he was at this facility undergoing rehabilitation post hospital admission from 11/15/15-11/23/15 and inpatient rehabilitation from 11/23/15-12/04/15 post DKA and acute on chronic renal failure. MRI c-spine showed cervical spondylosis with canal narrowing C4-C5 through C7-T1. He has medical history of DM type 2, CKD stage 4, peripheral neuropathy, gerd, CAD, prior TIA among others. He is seen in his room today.   Review of Systems:  Constitutional: Negative for fever, chills, diaphoresis. Energy level is slowly coming back.  HENT: Negative for headache, congestion, nasal discharge, sore throat, difficulty  swallowing.   Eyes: Negative for double vision and discharge. Wears glasses. Respiratory: Positive for cough with yellow phlegm for several weeks per patient, he also has some shortness of breath with exertion and occasional  wheezing.  he has been a smoker in the past and quit 4 years back.  Cardiovascular: Negative for chest pain, palpitations, leg swelling.  Gastrointestinal: Negative for heartburn, abdominal pain. He has poor appetite. He had some nausea this am followed by vomiting x 1. He has had 3 loose stools yesterday and 4 today. He ate some chinese vegetable and beef from outside yesterday. Genitourinary: Positive for burning with urination x 2 days Musculoskeletal: Negative for back pain, fall in the facility.  Skin: Negative for itching, rash.  Neurological: Negative for dizziness. Has history of neuropathy.  Psychiatric/Behavioral: Negative for depression.   Past Medical History:  Diagnosis Date  . BPH (benign prostatic hyperplasia)   . Chronic kidney disease   . Depression   . Diabetes mellitus without complication ([place])   . GERD (gastroesophageal reflux disease)   . Hyperlipidemia   . Hypertension   . Hypothyroidism   . TIA (transient ischemic attack)    Past Surgical History:  Procedure Laterality Date  . APPENDECTOMY    . CHOLECYSTECTOMY    . CYSTOURETHROSCOPY    . ESOPHAGOGASTRODUODENOSCOPY (EGD) WITH PROPOFOL N/A 12/17/2015   Procedure: ESOPHAGOGASTRODUODENOSCOPY (EGD) WITH PROPOFOL;  Surgeon: Irene Shipper, MD;  Location: [facility];  Service: Endoscopy;  Laterality: N/A;  . POLYPECTOMY     Vocal cord polyp removed  . VASECTOMY     Social History:   reports  that he quit smoking about 3 years ago. He has never used smokeless tobacco. He reports that he does not drink alcohol or use drugs.  Family History  Problem Relation Age of Onset  . CAD Mother   . Diabetes Mother   . Hyperlipidemia Mother   . Hypertension Mother   . CVA Mother   . Alcohol abuse  Father   . Diabetes Father   . Hyperlipidemia Father     Medications:   Medication List       Accurate as of 12/21/15 11:35 AM. Always use your most recent med list.          amLODipine 10 MG tablet Commonly known as:  NORVASC Take 10 mg by mouth daily.   aspirin 81 MG EC tablet Take 1 tablet (81 mg total) by mouth daily.   atorvastatin 80 MG tablet Commonly known as:  LIPITOR Take 1 tablet (80 mg total) by mouth daily.   carvedilol 25 MG tablet Commonly known as:  COREG Take 1 tablet (25 mg total) by mouth 2 (two) times daily with a meal.   citalopram 10 MG tablet Commonly known as:  CELEXA Take 1 tablet (10 mg total) by mouth daily.   DECUBI-VITE PO Take 1 tablet by mouth daily.   donepezil 10 MG tablet Commonly known as:  ARICEPT Take 1 tablet (10 mg total) by mouth at bedtime.   feeding supplement (PRO-STAT SUGAR FREE 64) Liqd Take 30 mLs by mouth 2 (two) times daily.   ferrous sulfate 325 (65 FE) MG tablet Take 325 mg by mouth 2 (two) times daily.   folic acid 1 MG tablet Commonly known as:  FOLVITE Take 1 tablet (1 mg total) by mouth daily.   hydrALAZINE 50 MG tablet Commonly known as:  APRESOLINE Take 50 mg by mouth every 8 (eight) hours.   insulin glargine 100 UNIT/ML injection Commonly known as:  LANTUS Inject 15 Units into the skin daily.   insulin lispro 100 UNIT/ML injection Commonly known as:  HUMALOG Inject 0.05-0.1 mLs (5-10 Units total) into the skin 3 (three) times daily before meals. Per sliding scale < 150 = 0 units; 150-250 = 5 units; 251-300 = 8 units; 301-350 = 10 units and > 350 notify provider.   levothyroxine 25 MCG tablet Commonly known as:  SYNTHROID, LEVOTHROID Take 1 tablet (25 mcg total) by mouth daily before breakfast.   Melatonin 3 MG Tabs Take 1 tablet (3 mg total) by mouth at bedtime.   montelukast 10 MG tablet Commonly known as:  SINGULAIR Take 10 mg by mouth at bedtime.   pantoprazole 40 MG tablet Commonly  known as:  PROTONIX Take 1 tablet (40 mg total) by mouth daily.   sodium bicarbonate 650 MG tablet Take 2 tablets (1,300 mg total) by mouth 2 (two) times daily.   ticagrelor 90 MG Tabs tablet Commonly known as:  BRILINTA Take 1 tablet (90 mg total) by mouth 2 (two) times daily.   torsemide 20 MG tablet Commonly known as:  DEMADEX Take 20 mg by mouth 2 (two) times daily.   zolpidem 5 MG tablet Commonly known as:  AMBIEN Take 1 tablet (5 mg total) by mouth at bedtime as needed for sleep.       Immunizations: Immunization History  Administered Date(s) Administered  . Influenza,inj,Quad PF,36+ Mos 11/26/2015  . PPD Test 12/04/2015     Physical Exam: Vitals:   12/21/15 1128  BP: (!) 162/74  Pulse: 80  Resp: 20  Temp: 97.2 F (36.2 C)  TempSrc: Oral  SpO2: 95%  Weight: 210 lb (95.3 kg)  Height: 6' (1.829 m)   Body mass index is 28.48 kg/m.  General- elderly male, overweight, in no acute distress Head- normocephalic, atraumatic Nose- no maxillary or frontal sinus tenderness, no nasal discharge Throat- moist mucus membrane  Eyes- PERRLA, EOMI, no pallor, no icterus, no discharge, normal conjunctiva, normal sclera Neck- no cervical lymphadenopathy Cardiovascular- normal s1,s2, no murmur Respiratory- bilateral clear to auscultation, occasional wheeze, no rhonchi, no crackles, no use of accessory muscles Abdomen- bowel sounds present, soft, non tender, no guarding or rigidity, no CVA tenderness Musculoskeletal- able to move all 4 extremities, generalized weakness, on wheelchair, trace leg edema present Neurological- alert and oriented to person, place and time Skin- warm and dry, stage 2 pressure ulcer to coccyx, unstageable pressure ulcer to both heels, bruise to both arms Psychiatry- normal mood and affect    Labs reviewed: Basic Metabolic Panel:  Recent Labs  11/18/15 0404  11/23/15 0601  11/25/15 0744  11/29/15 4665 11/30/15 0503 12/01/15 0509   12/16/15 1638 12/17/15 0556 12/18/15 0519  NA 135  < > 138  < > 135  < > 138 137 137  < > 138 139 139  K 3.8  < > 3.9  < > 3.9  < > 3.5 3.5 3.4*  < > 3.7 3.7 3.7  CL 111  < > 109  < > 106  < > 107 106 105  < > 105 105 105  CO2 16*  < > 19*  < > 18*  < > 23 21* 22  < > _0 GLUCOSE 229*  < > 118*  < > 236*  < > 172* 222* 253*  < > 156* 100* 120*  BUN 41*  < > 56*  < > 57*  < > 75* 81* 75*  < > 62* 63* 59*  CREATININE 3.20*  < > 3.63*  < > 3.54*  < > 3.61* 3.69* 3.58*  < > 4.54* 4.37* 4.38*  CALCIUM 8.5*  < > 8.2*  < > 8.4*  < > 8.4* 8.2* 8.5*  < > 8.2* 8.1* 7.6*  MG 1.8  --  1.6*  --  1.6*  --   --   --   --   --   --   --   --   PHOS 5.1*  < >  --   < > 4.4  < > 4.5 4.5 4.4  --   --   --   --   < > = values in this interval not displayed. Liver Function Tests:  Recent Labs  11/23/15 1948  12/01/15 0509 12/08/15 12/16/15 1638 12/17/15 0556  AST 16  --   --  10* 15 15  ALT 13*  --   --  9* 13* 12*  ALKPHOS 76  --   --  107 91 90  BILITOT 0.4  --   --   --  0.8 1.3*  PROT 5.7*  --   --   --  6.6 6.2*  ALBUMIN 1.6*  < > 1.7*  --  2.1* 2.0*  < > = values in this interval not displayed. No results for input(s): LIPASE, AMYLASE in the last 8760 hours.  Recent Labs  11/15/15 1830  AMMONIA 19   CBC:  Recent Labs  11/23/15 1948  12/02/15 0636  12/16/15 12/16/15 1638 12/17/15 0556 12/17/15 1710 12/18/15 0519  WBC  10.4  < > 12.0*  < > 6.8 8.3 7.6  --   --   NEUTROABS 5.1  --  5.9  --   --  3.2  --   --   --   HGB 7.3*  < > 8.3*  < > 6.2* 6.7* 7.6* 8.1* 7.7*  HCT 22.4*  < > 26.3*  < > 20* 21.3* 23.4* 25.7* 24.7*  MCV 88.5  < > 92.0  --   --  91.0 91.8  --   --   PLT 405*  < > 320  < > 289 349 313  --   --   < > = values in this interval not displayed. Cardiac Enzymes:  Recent Labs  11/16/15 0630 11/16/15 0914 11/20/15 0326 11/24/15 0912  CKTOTAL  --   --  55  --   TROPONINI 7.46* 7.46*  --  0.25*   BNP: Invalid input(s): POCBNP CBG:  Recent Labs   12/18/15 0420 12/18/15 0733 12/18/15 1149  GLUCAP 120* 124* 129*    Radiological Exams: Dg Chest 2 View  Result Date: 11/24/2015 CLINICAL DATA:  Fluid overload, pain left posterior chest region EXAM: CHEST  2 VIEW COMPARISON:  11/15/2015, 01/09/2013 FINDINGS: Mildly elevated left diaphragm. On the lateral view, there is hazy posterior lung opacification possibly related to an effusion, likely on the left side. Heart size is normal. Pulmonary vascularity within normal limits. No pneumothorax. IMPRESSION: Probable left-sided pleural effusion, best seen on lateral view with atelectasis or infiltrate at the left lung base. Electronically Signed   By: Donavan Foil M.D.   On: 11/24/2015 22:10   US Renal  Addendum Date: 12/16/2015   ADDENDUM REPORT: 12/16/2015 21:56 ADDENDUM: Should be noted a stable left-sided pleural effusion is noted as well. Electronically Signed   By: Inez Catalina M.D.   On: 12/16/2015 21:56   Result Date: 12/16/2015 CLINICAL DATA:  Elevated creatinine EXAM: RENAL / URINARY TRACT ULTRASOUND COMPLETE COMPARISON:  11/19/2015, 11/21/2015 FINDINGS: Right Kidney: Length: 12.3 cm. There is a cystic lesion in the upper pole of the right kidney again identified and stable from the recent exam. This is been evaluated on recent MRI with evidence of a mural nodule. The mural nodule is not well appreciated on today's exam and may have represented focal thrombus which has resolved. Additionally lower pole cysts are noted. The largest of these measures 1.3 cm. These are also stable from the prior MR and ultrasound. Very mild fullness of the collecting system is seen. Relative increased echogenicity of the kidney is noted. Left Kidney: Length: 12.5 cm. 2.6 cm cyst is noted within the left kidney also similar to that seen on prior ultrasound and MR. relative increased echogenicity of the kidneys noted. Bladder: Appears normal for degree of bladder distention. IMPRESSION: Bilateral cystic lesions  are again identified similar to that seen on prior MRI and ultrasound. The only significant difference is the absence of the mural nodule seen in the upper pole cystic lesion on the right. This may have represented thrombus which has resolved. Increased echogenicity consistent with medical renal disease. Electronically Signed: By: Inez Catalina M.D. On: 12/16/2015 21:43   Dg Abdomen Acute W/chest  Result Date: 12/16/2015 CLINICAL DATA:  Tarry stools for several days EXAM: DG ABDOMEN ACUTE W/ 1V CHEST COMPARISON:  11/26/2015. FINDINGS: Cardiac shadow is stable. The lungs are well aerated bilaterally. Mild interstitial changes are noted without focal infiltrate or sizable effusion. Scattered large and small bowel gas is noted. No  free air is seen. No abnormal mass or abnormal calcifications are noted. Degenerative changes of the lumbar spine are seen. Postsurgical changes are noted. IMPRESSION: Chronic interstitial changes within the lungs. No acute abnormality in the abdomen. Electronically Signed   By: Inez Catalina M.D.   On: 12/16/2015 18:29   Dg Bone Survey Met  Addendum Date: 11/26/2015   ADDENDUM REPORT: 11/26/2015 18:37 ADDENDUM: Bilateral twelfth rib fractures noted. Electronically Signed   By: Nolon Nations M.D.   On: 11/26/2015 18:37   Result Date: 11/26/2015 CLINICAL DATA:  Elevated serum immunoglobulin free light chains. Pt could not stand or roll onto his side. Best obtainable images due to pt condition. EXAM: METASTATIC BONE SURVEY COMPARISON:  Chest x-ray 11/24/2015, abdominal film 09/16/2015 FINDINGS: Images of the axial and appendicular skeleton are performed. There are acute fractures of the left fourth and fifth ribs. Heart size is normal. There is dense opacity in the left lower lobe and left pleural effusion. Evaluation of the spine is limited by limited patient mobility. Degenerative changes are identified in the mid cervical spine. There is no acute fracture identified. Degenerative  changes are identified throughout the thoracic and lumbar spine. There is vertebral body height loss at L3, likely chronic based on its appearance. Deformity of the left superior pubic ramus and possibly left inferior pubic ramus consistent with fracture. No suspicious lytic or blastic lesions are identified. There is dense atherosclerotic calcification of the imaged vessels. Incidental note of subcutaneous edema. Surgical clips are noted in the right upper quadrant of the abdomen. IMPRESSION: 1. No suspicious lytic or blastic lesions. 2. Acute fractures of the left fourth and fifth ribs. 3. Acute fractures of the left superior and possibly inferior pubic ramus. 4. Atherosclerotic disease.  Subcutaneous edema. Electronically Signed: By: Nolon Nations M.D. On: 11/26/2015 15:29    Assessment/Plan   Generalized weakness From physical deconditioning. Will have him work with physical therapy and occupational therapy team to help with gait training and muscle strengthening exercises.fall precautions. Skin care. Encourage to be out of bed.   Acute blood loss anemia From gastric erosion and CKD. Monitor cbc. Will get nephrology referral to evaluate for need for epogen. Continue feso4 325 mg bid for now.  Gastric erosion Continue protonix 40 mg daily for now, monitor for bleed  Acute on ckd stage 4 Monitor bmp. Will need follow up with [facility] in 2-4 weeks. Continue sodium bicarbonate.   Diarrhea Has had 4 bowel movement today and 3 yesterday. Mentions having loose stool on and off for 2 weeks with recent worsening. With his vomiting, place him on clear liquid diet for today and advance diet as tolerated. Send stool for c.diff and leukocytes. Monitor.   Stage 2 pressure ulcer coccyx To provide wound care and pressure ulcer prophylaxis. Continue decubivite  unstageable pressure ulcer to heels Continue to offload pressure to his heels  Protein calorie malnutrition RD to evaluate. Monitor  his weight  Chronic cough Likely from chronic bronchitis/ emphysema. Monitor for signs of infection. Will have him on robitussin 5 cc tid x 1week with albuterol nebulizer tid x 5 days and monitor  Insomnia Continue ambien 5 mg qhs prn and monitor  Depression Continue celexa 10 mg daily, get psychiatry consult to assess further  DM type 2 with neuropathy Lab Results  Component Value Date   HGBA1C 10.0 (H) 11/16/2015   a1c is suggestive of poorly controlled diabetes. Monitor cbg. Continue lantus 15 u daily with SSI with meals. Continue  aspirin and statin  Dementia without behavioral disturbance Continue aricept 10 mg daily. Provide supportive care.   CAD Recent NSTEMI. Will need f/u with cardiology in 4 week for possible angiography. Continue coreg 25 mg bid and brilinta 90 mg bid with aspirin 81 mg daily and atorvastatin.  HTN Monitor BP reading. Currently on coreg 25 mg bid, norvasc 10 mg daily, demadex 20 mg bid and hydralazine 50 mg tid. Monitor his weight 3 days a week with concerns from staff about increasing edema  Cervical spondylosis with myelopathy Stable, continue therapy, denies pain, off pain meds for now.   Right kidney cystic lesion Seen by nephrology, urology and IR at the hospital. Plan is for renal ultrasound vs MRI with contrast in 3-6 months for re-evaluation.   Hypothyroidism Continue synthroid    Goals of care: short term rehabilitation   Labs/tests ordered: cbc, cmp 12/22/15  Family/ staff Communication: reviewed care plan with patient and nursing supervisor    Blanchie Serve, MD Internal Medicine [place], [place] [address] Cell Phone (Monday-Friday 8 am - 5 pm): [phone] On Call: [phone] and follow prompts after 5 pm and on weekends Office Phone: [phone] Office Fax: [phone]

## 2015-12-21 NOTE — Telephone Encounter (Signed)
[facility] [phone] Fax: [phone]  

## 2015-12-22 LAB — BASIC METABOLIC PANEL
BUN: 56 mg/dL — AB (ref 4–21)
CREATININE: 4.1 mg/dL — AB (ref 0.6–1.3)
GLUCOSE: 64 mg/dL
POTASSIUM: 4.3 mmol/L (ref 3.4–5.3)
Sodium: 143 mmol/L (ref 137–147)

## 2015-12-22 LAB — CBC AND DIFFERENTIAL
HCT: 26 % — AB (ref 41–53)
HEMOGLOBIN: 7.9 g/dL — AB (ref 13.5–17.5)
PLATELETS: 263 10*3/uL (ref 150–399)
WBC: 7.6 10*3/mL

## 2015-12-25 ENCOUNTER — Non-Acute Institutional Stay (SKILLED_NURSING_FACILITY): Payer: MEDICARE | Admitting: Family

## 2015-12-25 DIAGNOSIS — R059 Cough, unspecified: Secondary | ICD-10-CM

## 2015-12-25 DIAGNOSIS — A0472 Enterocolitis due to Clostridium difficile, not specified as recurrent: Secondary | ICD-10-CM

## 2015-12-25 DIAGNOSIS — R600 Localized edema: Secondary | ICD-10-CM

## 2015-12-25 DIAGNOSIS — R112 Nausea with vomiting, unspecified: Secondary | ICD-10-CM

## 2015-12-25 DIAGNOSIS — R05 Cough: Secondary | ICD-10-CM

## 2015-12-25 NOTE — Progress Notes (Signed)
Location:  [place] Room Number: 105 Place of Service:  SNF [phone]) Provider:  Dodie Parisi FNP-C   Kym Groom Guy Begin, MD  Patient Care Team: [name], MD as PCP - General [facility] Medicine)  Extended Emergency Contact Information Primary Emergency Contact: [facility] Address: [address]          [place], [place] [address] [place] Phone: [phone] Mobile Phone: [phone] Relation: Significant other  Code Status: Full Code  Goals of care: Advanced Directive information Advanced Directives 12/18/2015  Does Patient Have a Medical Advance Directive? Yes  Type of Paramedic of [place];Living will  Does patient want to make changes to medical advance directive? No - Patient declined  Copy of [place] in Chart? Yes     Chief Complaint  Patient presents with  . Acute Visit    abnormal lab result    HPI:  Pt is a 65 y.o. male seen today for an acute visit for evaluation of abnormal lab results. He is seen in his room today. He complains of non-productive cough for several days. Also states he continues to have loose stool. He denies any fever, chills, shortness of breath or wheezing. His recent stool specimen results showed C-diff positive.    Past Medical History:  Diagnosis Date  . BPH (benign prostatic hyperplasia)   . Chronic kidney disease   . Depression   . Diabetes mellitus without complication ([place])   . GERD (gastroesophageal reflux disease)   . Hyperlipidemia   . Hypertension   . Hypothyroidism   . TIA (transient ischemic attack)    Past Surgical History:  Procedure Laterality Date  . APPENDECTOMY    . CHOLECYSTECTOMY    . CYSTOURETHROSCOPY    . ESOPHAGOGASTRODUODENOSCOPY (EGD) WITH PROPOFOL N/A 12/17/2015   Procedure: ESOPHAGOGASTRODUODENOSCOPY (EGD) WITH PROPOFOL;  Surgeon: Irene Shipper, MD;  Location: [facility];  Service:  Endoscopy;  Laterality: N/A;  . POLYPECTOMY     Vocal cord polyp removed  . VASECTOMY      Allergies  Allergen Reactions  . Penicillins Other (See Comments)    Unknown  . Tetracyclines & Related Other (See Comments)    Unknown      Medication List       Accurate as of 12/25/15 12:44 PM. Always use your most recent med list.          amLODipine 10 MG tablet Commonly known as:  NORVASC Take 10 mg by mouth daily.   aspirin 81 MG EC tablet Take 1 tablet (81 mg total) by mouth daily.   atorvastatin 80 MG tablet Commonly known as:  LIPITOR Take 1 tablet (80 mg total) by mouth daily.   carvedilol 25 MG tablet Commonly known as:  COREG Take 1 tablet (25 mg total) by mouth 2 (two) times daily with a meal.   citalopram 10 MG tablet Commonly known as:  CELEXA Take 1 tablet (10 mg total) by mouth daily.   DECUBI-VITE PO Take 1 tablet by mouth daily.   donepezil 10 MG tablet Commonly known as:  ARICEPT Take 1 tablet (10 mg total) by mouth at bedtime.   feeding supplement (PRO-STAT SUGAR FREE 64) Liqd Take 30 mLs by mouth 2 (two) times daily.   ferrous sulfate 325 (65 FE) MG tablet Take 325 mg by mouth 2 (two) times daily.   folic acid 1 MG tablet Commonly known as:  FOLVITE Take 1  tablet (1 mg total) by mouth daily.   hydrALAZINE 50 MG tablet Commonly known as:  APRESOLINE Take 50 mg by mouth every 8 (eight) hours.   insulin glargine 100 UNIT/ML injection Commonly known as:  LANTUS Inject 15 Units into the skin daily.   insulin lispro 100 UNIT/ML injection Commonly known as:  HUMALOG Inject 0.05-0.1 mLs (5-10 Units total) into the skin 3 (three) times daily before meals. Per sliding scale < 150 = 0 units; 150-250 = 5 units; 251-300 = 8 units; 301-350 = 10 units and > 350 notify provider.   levothyroxine 25 MCG tablet Commonly known as:  SYNTHROID, LEVOTHROID Take 1 tablet (25 mcg total) by mouth daily before breakfast.   Melatonin 3 MG Tabs Take 1 tablet  (3 mg total) by mouth at bedtime.   montelukast 10 MG tablet Commonly known as:  SINGULAIR Take 10 mg by mouth at bedtime.   pantoprazole 40 MG tablet Commonly known as:  PROTONIX Take 1 tablet (40 mg total) by mouth daily.   sodium bicarbonate 650 MG tablet Take 2 tablets (1,300 mg total) by mouth 2 (two) times daily.   ticagrelor 90 MG Tabs tablet Commonly known as:  BRILINTA Take 1 tablet (90 mg total) by mouth 2 (two) times daily.   torsemide 20 MG tablet Commonly known as:  DEMADEX Take 20 mg by mouth 2 (two) times daily.   zolpidem 5 MG tablet Commonly known as:  AMBIEN Take 1 tablet (5 mg total) by mouth at bedtime as needed for sleep.       Review of Systems  Constitutional: Negative for activity change, appetite change, chills, fatigue and fever.  HENT: Negative for congestion, rhinorrhea, sinus pain, sinus pressure, sneezing and sore throat.   Eyes: Negative.   Respiratory: Negative for cough, chest tightness and wheezing.   Cardiovascular: Positive for leg swelling. Negative for chest pain and palpitations.  Gastrointestinal: Positive for diarrhea and nausea. Negative for abdominal distention, abdominal pain, constipation and vomiting.  Endocrine: Negative for polydipsia, polyphagia and polyuria.  Genitourinary: Negative for dysuria, flank pain, frequency and urgency.  Musculoskeletal: Positive for gait problem.  Skin: Negative for color change, pallor and rash.       Right heel and sacral ulcers.   Neurological: Negative for dizziness, seizures, syncope, light-headedness and headaches.  Hematological: Does not bruise/bleed easily.  Psychiatric/Behavioral: Negative for agitation, confusion, hallucinations and sleep disturbance. The patient is not nervous/anxious.     Immunization History  Administered Date(s) Administered  . Influenza,inj,Quad PF,36+ Mos 11/26/2015  . PPD Test 12/04/2015   Pertinent  Health Maintenance Due  Topic Date Due  . FOOT EXAM   07/29/1959  . OPHTHALMOLOGY EXAM  07/29/1959  . URINE MICROALBUMIN  07/29/1959  . COLONOSCOPY  07/29/1999  . PNA vac Low Risk Adult (1 of 2 - PCV13) 07/29/2014  . HEMOGLOBIN A1C  05/16/2016  . INFLUENZA VACCINE  Completed      Vitals:   12/25/15 1000  BP: 126/80  Pulse: 79  Resp: 18  Temp: (!) 96.8 F (36 C)  SpO2: 95%  Weight: 210 lb (95.3 kg)  Height: 6' (1.829 m)   Body mass index is 28.48 kg/m. Physical Exam  Constitutional: He is oriented to person, place, and time. He appears well-developed and well-nourished. No distress.  HENT:  Head: Normocephalic.  Mouth/Throat: Oropharynx is clear and moist. No oropharyngeal exudate.  Eyes: Conjunctivae and EOM are normal. Pupils are equal, round, and reactive to light. Right eye exhibits no  discharge. Left eye exhibits no discharge. No scleral icterus.  Neck: Normal range of motion. No JVD present. No thyromegaly present.  Cardiovascular: Normal rate, regular rhythm, normal heart sounds and intact distal pulses.  Exam reveals no gallop and no friction rub.   No murmur heard. Pulmonary/Chest: Effort normal and breath sounds normal. No respiratory distress. He has no wheezes. He has no rales.  Abdominal: Soft. Bowel sounds are normal. He exhibits no distension. There is no tenderness. There is no rebound and no guarding.  Genitourinary:  Genitourinary Comments: Continent   Musculoskeletal: He exhibits no tenderness or deformity.  Unsteady. 1-2+ pitting edema to lower extremities.   Lymphadenopathy:    He has no cervical adenopathy.  Neurological: He is oriented to person, place, and time.  Skin: Skin is warm and dry. No rash noted. No erythema. No pallor.  Right heel and sacral wound drsg dry, clean and intact.Surrounding skin tissue without any signs of infections.   Psychiatric: He has a normal mood and affect.    Labs reviewed:  Recent Labs  11/18/15 0404  11/23/15 0601  11/25/15 0744  11/29/15 [id number] 11/30/15 0503  12/01/15 0509  12/16/15 1638 12/17/15 0556 12/18/15 0519  NA 135  < > 138  < > 135  < > 138 137 137  < > 138 139 139  K 3.8  < > 3.9  < > 3.9  < > 3.5 3.5 3.4*  < > 3.7 3.7 3.7  CL 111  < > 109  < > 106  < > 107 106 105  < > 105 105 105  CO2 16*  < > 19*  < > 18*  < > 23 21* 22  < > 24 24 26   GLUCOSE 229*  < > 118*  < > 236*  < > 172* 222* 253*  < > 156* 100* 120*  BUN 41*  < > 56*  < > 57*  < > 75* 81* 75*  < > 62* 63* 59*  CREATININE 3.20*  < > 3.63*  < > 3.54*  < > 3.61* 3.69* 3.58*  < > 4.54* 4.37* 4.38*  CALCIUM 8.5*  < > 8.2*  < > 8.4*  < > 8.4* 8.2* 8.5*  < > 8.2* 8.1* 7.6*  MG 1.8  --  1.6*  --  1.6*  --   --   --   --   --   --   --   --   PHOS 5.1*  < >  --   < > 4.4  < > 4.5 4.5 4.4  --   --   --   --   < > = values in this interval not displayed.  Recent Labs  11/23/15 1948  12/01/15 0509 12/08/15 12/16/15 1638 12/17/15 0556  AST 16  --   --  10* 15 15  ALT 13*  --   --  9* 13* 12*  ALKPHOS 76  --   --  107 91 90  BILITOT 0.4  --   --   --  0.8 1.3*  PROT 5.7*  --   --   --  6.6 6.2*  ALBUMIN 1.6*  < > 1.7*  --  2.1* 2.0*  < > = values in this interval not displayed.  Recent Labs  11/23/15 1948  12/02/15 0636  12/16/15 12/16/15 1638 12/17/15 0556 12/17/15 1710 12/18/15 0519  WBC 10.4  < > 12.0*  < > 6.8 8.3 7.6  --   --  NEUTROABS 5.1  --  5.9  --   --  3.2  --   --   --   HGB 7.3*  < > 8.3*  < > 6.2* 6.7* 7.6* 8.1* 7.7*  HCT 22.4*  < > 26.3*  < > 20* 21.3* 23.4* 25.7* 24.7*  MCV 88.5  < > 92.0  --   --  91.0 91.8  --   --   PLT 405*  < > 320  < > 289 349 313  --   --   < > = values in this interval not displayed. Lab Results  Component Value Date   TSH 5.960 (H) 12/17/2015   Lab Results  Component Value Date   HGBA1C 10.0 (H) 11/16/2015   Lab Results  Component Value Date   CHOL 129 11/16/2015   HDL 38 (L) 11/16/2015   LDLCALC 73 11/16/2015   TRIG 89 11/16/2015   CHOLHDL 3.4 11/16/2015   Assessment/Plan 1. Localized edema Bilateral lower  extremities. Apply knee high ted hose on in the morning and off at bedtime. Continue Torsemide.   2. C-diff positive  Continues to report loose stool. Stool specimen positive for c-diff toxin. Start Flagly 500 mg Tablet by mouth every 8 Hrs X 14 days. Florastor 250 mg Capsule twice daily x 14 days   3. Cough  Afebrile.Obtain portable CXR Pa/Lat rule out PNA   4. Nausea  Encourage to take Zofran 4 mg Tablet as needed especially before meals.     Family/ staff Communication: Reviewed plan of care with patient and facility Nurse supervisor.   Labs/tests ordered: None

## 2015-12-29 ENCOUNTER — Inpatient Hospital Stay (INDEPENDENT_AMBULATORY_CARE_PROVIDER_SITE_OTHER): Payer: MEDICARE | Admitting: Orthopaedic Surgery

## 2016-01-01 ENCOUNTER — Non-Acute Institutional Stay (SKILLED_NURSING_FACILITY): Payer: MEDICARE | Admitting: Family

## 2016-01-01 DIAGNOSIS — R197 Diarrhea, unspecified: Secondary | ICD-10-CM | POA: Diagnosis not present

## 2016-01-01 DIAGNOSIS — L89152 Pressure ulcer of sacral region, stage 2: Secondary | ICD-10-CM

## 2016-01-01 DIAGNOSIS — E162 Hypoglycemia, unspecified: Secondary | ICD-10-CM

## 2016-01-01 DIAGNOSIS — R21 Rash and other nonspecific skin eruption: Secondary | ICD-10-CM | POA: Diagnosis not present

## 2016-01-01 MED ORDER — INSULIN GLARGINE 100 UNIT/ML ~~LOC~~ SOLN: 8.0000 [IU] | Freq: Every day | SUBCUTANEOUS | Status: AC

## 2016-01-01 NOTE — Progress Notes (Signed)
Location:  [place] Room Number: 105 Place of Service:  SNF [phone]) Provider: Dinah Ngetich FNP-C   Kym Groom Guy Begin, MD  Patient Care Team: [name], MD as PCP - General [facility] Medicine)  Extended Emergency Contact Information Primary Emergency Contact: [facility] Address: [address]          [place], [place] [address] [place] Phone: [phone] Mobile Phone: [phone] Relation: Significant other  Code Status:  Full Code  Goals of care: Advanced Directive information Advanced Directives 12/18/2015  Does Patient Have a Medical Advance Directive? Yes  Type of Paramedic of [place];Living will  Does patient want to make changes to medical advance directive? No - Patient declined  Copy of [place] in Chart? Yes     Chief Complaint  Patient presents with  . Acute Visit    Hypoglycemia     HPI:  Pt is a 65 y.o. male seen today at [facility] for an acute visit for evaluation of low blood sugars. He has a significant medical history of Type 2 DM , HTN, CKG stage 4 among other conditions. He is seen in his room today. His CBG runs in the low 50's- 70's at times. He states does not eat well at times.His nausea and vomiting has improved " No nausea today". He is currently on Flagyl for C-diff. He is also on Levaquin for recent PNA. He has a new pressure ulcer on sacral area managed by wound care Nurse. Encouraged to change position frequently.    Past Medical History:  Diagnosis Date  . BPH (benign prostatic hyperplasia)   . Chronic kidney disease   . Depression   . Diabetes mellitus without complication ([place])   . GERD (gastroesophageal reflux disease)   . Hyperlipidemia   . Hypertension   . Hypothyroidism   . TIA (transient ischemic attack)    Past Surgical History:  Procedure Laterality Date  . APPENDECTOMY    .  CHOLECYSTECTOMY    . CYSTOURETHROSCOPY    . ESOPHAGOGASTRODUODENOSCOPY (EGD) WITH PROPOFOL N/A 12/17/2015   Procedure: ESOPHAGOGASTRODUODENOSCOPY (EGD) WITH PROPOFOL;  Surgeon: Irene Shipper, MD;  Location: [facility];  Service: Endoscopy;  Laterality: N/A;  . POLYPECTOMY     Vocal cord polyp removed  . VASECTOMY      Allergies  Allergen Reactions  . Penicillins Other (See Comments)    Unknown  . Tetracyclines & Related Other (See Comments)    Unknown    Allergies as of 01/01/2016      Reactions   Penicillins Other (See Comments)   Unknown   Tetracyclines & Related Other (See Comments)   Unknown      Medication List       Accurate as of 01/01/16  5:44 PM. Always use your most recent med list.          amLODipine 10 MG tablet Commonly known as:  NORVASC Take 10 mg by mouth daily.   aspirin 81 MG EC tablet Take 1 tablet (81 mg total) by mouth daily.   atorvastatin 80 MG tablet Commonly known as:  LIPITOR Take 1 tablet (80 mg total) by mouth daily.   carvedilol 25 MG tablet Commonly known as:  COREG Take 1 tablet (25 mg total) by mouth 2 (two) times daily with a meal.   citalopram 10 MG tablet Commonly known as:  CELEXA Take 1 tablet (10 mg total)  by mouth daily.   DECUBI-VITE PO Take 1 tablet by mouth daily.   donepezil 10 MG tablet Commonly known as:  ARICEPT Take 1 tablet (10 mg total) by mouth at bedtime.   feeding supplement (PRO-STAT SUGAR FREE 64) Liqd Take 30 mLs by mouth 2 (two) times daily.   ferrous sulfate 325 (65 FE) MG tablet Take 325 mg by mouth 2 (two) times daily.   folic acid 1 MG tablet Commonly known as:  FOLVITE Take 1 tablet (1 mg total) by mouth daily.   hydrALAZINE 50 MG tablet Commonly known as:  APRESOLINE Take 50 mg by mouth every 8 (eight) hours.   insulin glargine 100 UNIT/ML injection Commonly known as:  LANTUS Inject 0.08 mLs (8 Units total) into the skin daily.   insulin lispro 100 UNIT/ML injection Commonly  known as:  HUMALOG Inject 0.05-0.1 mLs (5-10 Units total) into the skin 3 (three) times daily before meals. Per sliding scale < 150 = 0 units; 150-250 = 5 units; 251-300 = 8 units; 301-350 = 10 units and > 350 notify provider.   levothyroxine 25 MCG tablet Commonly known as:  SYNTHROID, LEVOTHROID Take 1 tablet (25 mcg total) by mouth daily before breakfast.   Melatonin 3 MG Tabs Take 1 tablet (3 mg total) by mouth at bedtime.   montelukast 10 MG tablet Commonly known as:  SINGULAIR Take 10 mg by mouth at bedtime.   pantoprazole 40 MG tablet Commonly known as:  PROTONIX Take 1 tablet (40 mg total) by mouth daily.   sodium bicarbonate 650 MG tablet Take 2 tablets (1,300 mg total) by mouth 2 (two) times daily.   ticagrelor 90 MG Tabs tablet Commonly known as:  BRILINTA Take 1 tablet (90 mg total) by mouth 2 (two) times daily.   torsemide 20 MG tablet Commonly known as:  DEMADEX Take 20 mg by mouth 2 (two) times daily.   zolpidem 5 MG tablet Commonly known as:  AMBIEN Take 1 tablet (5 mg total) by mouth at bedtime as needed for sleep.       Review of Systems  Constitutional: Negative for activity change, appetite change, chills, fatigue and fever.  HENT: Negative for congestion, rhinorrhea, sinus pain, sinus pressure, sneezing and sore throat.   Eyes: Negative.   Respiratory: Negative for cough, chest tightness and wheezing.   Gastrointestinal: Negative for abdominal distention, abdominal pain, constipation and vomiting.        Loose stool   Endocrine: Negative for polydipsia, polyphagia and polyuria.  Genitourinary: Negative for dysuria, flank pain, frequency and urgency.  Musculoskeletal: Positive for gait problem.  Skin: Positive for rash. Negative for color change and pallor.       Right heel and sacral ulcers.   Neurological: Negative for dizziness, seizures, syncope, light-headedness and headaches.  Hematological: Does not bruise/bleed easily.    Psychiatric/Behavioral: Negative for agitation, confusion, hallucinations and sleep disturbance. The patient is not nervous/anxious.     Immunization History  Administered Date(s) Administered  . Influenza,inj,Quad PF,36+ Mos 11/26/2015  . PPD Test 12/04/2015   Pertinent  Health Maintenance Due  Topic Date Due  . FOOT EXAM  07/29/1959  . OPHTHALMOLOGY EXAM  07/29/1959  . URINE MICROALBUMIN  07/29/1959  . COLONOSCOPY  07/29/1999  . PNA vac Low Risk Adult (1 of 2 - PCV13) 07/29/2014  . HEMOGLOBIN A1C  05/16/2016  . INFLUENZA VACCINE  Completed   No flowsheet data found. Functional Status Survey:    Vitals:   01/01/16 1200  BP: 140/78  Pulse: 74  Resp: 18  Temp: 97.4 F (36.3 C)  SpO2: 96%  Weight: 218 lb (98.9 kg)  Height: 6' (1.829 m)   Body mass index is 29.57 kg/m. Physical Exam  Constitutional: He is oriented to person, place, and time. He appears well-developed and well-nourished. No distress.  HENT:  Head: Normocephalic.  Mouth/Throat: Oropharynx is clear and moist. No oropharyngeal exudate.  Eyes: Conjunctivae and EOM are normal. Pupils are equal, round, and reactive to light. Right eye exhibits no discharge. Left eye exhibits no discharge. No scleral icterus.  Neck: Normal range of motion. No JVD present. No thyromegaly present.  Cardiovascular: Normal rate, regular rhythm, normal heart sounds and intact distal pulses.  Exam reveals no gallop and no friction rub.   No murmur heard. Pulmonary/Chest: Effort normal and breath sounds normal. No respiratory distress. He has no wheezes. He has no rales.  Abdominal: Soft. Bowel sounds are normal. He exhibits no distension. There is no tenderness. There is no rebound and no guarding.  Genitourinary:  Genitourinary Comments: Continent   Musculoskeletal: He exhibits no tenderness or deformity.  Unsteady.Moves x 4 extremities.   Lymphadenopathy:    He has no cervical adenopathy.  Neurological: He is oriented to  person, place, and time.  Skin: Skin is warm and dry. No rash noted. No erythema. No pallor.  Right heel and sacral wound drsg changed by wound Nurse. Sacral ulcer wound bed pale yellow without any drainage noted.    Psychiatric: He has a normal mood and affect.    Labs reviewed:  Recent Labs  11/18/15 0404  11/23/15 0601  11/25/15 0744  11/29/15 [id number] 11/30/15 0503 12/01/15 0509  12/16/15 1638 12/17/15 0556 12/18/15 0519  NA 135  < > 138  < > 135  < > 138 137 137  < > 138 139 139  K 3.8  < > 3.9  < > 3.9  < > 3.5 3.5 3.4*  < > 3.7 3.7 3.7  CL 111  < > 109  < > 106  < > 107 106 105  < > 105 105 105  CO2 16*  < > 19*  < > 18*  < > 23 21* 22  < > 24 24 26   GLUCOSE 229*  < > 118*  < > 236*  < > 172* 222* 253*  < > 156* 100* 120*  BUN 41*  < > 56*  < > 57*  < > 75* 81* 75*  < > 62* 63* 59*  CREATININE 3.20*  < > 3.63*  < > 3.54*  < > 3.61* 3.69* 3.58*  < > 4.54* 4.37* 4.38*  CALCIUM 8.5*  < > 8.2*  < > 8.4*  < > 8.4* 8.2* 8.5*  < > 8.2* 8.1* 7.6*  MG 1.8  --  1.6*  --  1.6*  --   --   --   --   --   --   --   --   PHOS 5.1*  < >  --   < > 4.4  < > 4.5 4.5 4.4  --   --   --   --   < > = values in this interval not displayed.  Recent Labs  11/23/15 1948  12/01/15 0509 12/08/15 12/16/15 1638 12/17/15 0556  AST 16  --   --  10* 15 15  ALT 13*  --   --  9* 13* 12*  ALKPHOS 76  --   --  107 91 90  BILITOT 0.4  --   --   --  0.8 1.3*  PROT 5.7*  --   --   --  6.6 6.2*  ALBUMIN 1.6*  < > 1.7*  --  2.1* 2.0*  < > = values in this interval not displayed.  Recent Labs  11/23/15 1948  12/02/15 0636  12/16/15 12/16/15 1638 12/17/15 0556 12/17/15 1710 12/18/15 0519  WBC 10.4  < > 12.0*  < > 6.8 8.3 7.6  --   --   NEUTROABS 5.1  --  5.9  --   --  3.2  --   --   --   HGB 7.3*  < > 8.3*  < > 6.2* 6.7* 7.6* 8.1* 7.7*  HCT 22.4*  < > 26.3*  < > 20* 21.3* 23.4* 25.7* 24.7*  MCV 88.5  < > 92.0  --   --  91.0 91.8  --   --   PLT 405*  < > 320  < > 289 349 313  --   --   < > = values  in this interval not displayed. Lab Results  Component Value Date   TSH 5.960 (H) 12/17/2015   Lab Results  Component Value Date   HGBA1C 10.0 (H) 11/16/2015   Lab Results  Component Value Date   CHOL 129 11/16/2015   HDL 38 (L) 11/16/2015   LDLCALC 73 11/16/2015   TRIG 89 11/16/2015   CHOLHDL 3.4 11/16/2015    Assessment/Plan 1. Hypoglycemia CBG's ranging in 70-200's with occasional 50's.Asymptomatic. Will continue on Humalog per SSI. Decreased Lantus to 8 units. Continue to monitor.   2. Decubitus ulcer of sacral region, stage 2 Afebrile. Sacral ulcer wound bed pale yellow without any drainage noted. Wound care Nurse to continue with Santyl for debridement. RD consult for protein supplements.   3. Diarrhea, unspecified type Currently on Flagyl for C-diff.   4. Rash  Red non raised on the back. Patient states has always had the rash due to heat .Not itchy. Will continue to monitor.      Family/ staff Communication: Reviewed plan of care with patient and facility Nurse supervisor.   Labs/tests ordered: None

## 2016-01-06 ENCOUNTER — Encounter: Payer: MEDICARE | Admitting: Physical Medicine & Rehabilitation

## 2016-01-21 ENCOUNTER — Non-Acute Institutional Stay (SKILLED_NURSING_FACILITY): Payer: MEDICARE | Admitting: Family

## 2016-01-21 ENCOUNTER — Encounter: Payer: Self-pay | Admitting: Family

## 2016-01-21 DIAGNOSIS — R638 Other symptoms and signs concerning food and fluid intake: Secondary | ICD-10-CM

## 2016-01-21 DIAGNOSIS — R531 Weakness: Secondary | ICD-10-CM

## 2016-01-21 DIAGNOSIS — R0602 Shortness of breath: Secondary | ICD-10-CM

## 2016-01-21 DIAGNOSIS — R112 Nausea with vomiting, unspecified: Secondary | ICD-10-CM | POA: Diagnosis not present

## 2016-01-21 NOTE — Progress Notes (Signed)
Location:  [place] Room Number: 105 Place of Service:  SNF [phone]) Provider:  Marlowe Sax, FNP-C   Blanchie Serve, MD  Patient Care Team: Blanchie Serve, MD as PCP - General (Internal Medicine)  Extended Emergency Contact Information Primary Emergency Contact: [facility] Address: [address]          [place], [place] [address] [place] Phone: [phone] Mobile Phone: [phone] Relation: Significant other  Code Status:  Full Code Goals of care: Advanced Directive information Advanced Directives 01/21/2016  Does Patient Have a Medical Advance Directive? Yes  Type of Advance Directive [place]  Does patient want to make changes to medical advance directive? -  Copy of [place] in Chart? Yes     Chief Complaint  Patient presents with  . Acute Visit    pale, weak, nausea, 02 90 without, nurse put him on 2 liter 02, when up to 02 99, blood sugar 245.    HPI:  Pt is a 66 y.o. male seen today at [facility] for an acute visit for evaluation of shortness of breath. He has a medical history of pleural effusion, HTN, NSTEMI among other conditions. He is seen in his room today per facility Nurse request. Nurse reports patient's oxygen saturations were 90% on RA with complains of SOB. Oxygen 2 liters [place] via [place] was ordered by on call provider. Facility Therapist states patient has not been able to exercise with PT/OT due to complains of weakness. Facility Nurse reports patient not eating.     Past Medical History:  Diagnosis Date  . BPH (benign prostatic hyperplasia)   . Chronic kidney disease   . Depression   . Diabetes mellitus without complication ([place])   . GERD (gastroesophageal reflux disease)   . Hyperlipidemia   . Hypertension   . Hypothyroidism   . TIA (transient ischemic attack)    Past Surgical History:  Procedure Laterality Date  . APPENDECTOMY     . CHOLECYSTECTOMY    . CYSTOURETHROSCOPY    . ESOPHAGOGASTRODUODENOSCOPY (EGD) WITH PROPOFOL N/A 12/17/2015   Procedure: ESOPHAGOGASTRODUODENOSCOPY (EGD) WITH PROPOFOL;  Surgeon: Irene Shipper, MD;  Location: [facility];  Service: Endoscopy;  Laterality: N/A;  . POLYPECTOMY     Vocal cord polyp removed  . VASECTOMY      Allergies  Allergen Reactions  . Penicillins Other (See Comments)    Unknown  . Tetracyclines & Related Other (See Comments)    Unknown    Allergies as of 01/21/2016      Reactions   Penicillins Other (See Comments)   Unknown   Tetracyclines & Related Other (See Comments)   Unknown      Medication List       Accurate as of 01/21/16  9:12 AM. Always use your most recent med list.          amLODipine 10 MG tablet Commonly known as:  NORVASC Take 10 mg by mouth daily.   aspirin 81 MG EC tablet Take 1 tablet (81 mg total) by mouth daily.   atorvastatin 80 MG tablet Commonly known as:  LIPITOR Take 1 tablet (80 mg total) by mouth daily.   carvedilol 25 MG tablet Commonly known as:  COREG Take 1 tablet (25 mg total) by mouth 2 (two) times daily with a meal.   citalopram 10 MG tablet Commonly known as:  CELEXA Take 1 tablet (10 mg total)  by mouth daily.   DECUBI-VITE PO Take 1 tablet by mouth daily.   donepezil 10 MG tablet Commonly known as:  ARICEPT Take 1 tablet (10 mg total) by mouth at bedtime.   feeding supplement (PRO-STAT SUGAR FREE 64) Liqd Take 30 mLs by mouth 2 (two) times daily.   ferrous sulfate 325 (65 FE) MG tablet Take 325 mg by mouth 2 (two) times daily.   folic acid 1 MG tablet Commonly known as:  FOLVITE Take 1 tablet (1 mg total) by mouth daily.   hydrALAZINE 50 MG tablet Commonly known as:  APRESOLINE Take 50 mg by mouth every 8 (eight) hours.   insulin glargine 100 UNIT/ML injection Commonly known as:  LANTUS Inject 0.08 mLs (8 Units total) into the skin daily.   insulin lispro 100 UNIT/ML injection Commonly  known as:  HUMALOG Inject 0.05-0.1 mLs (5-10 Units total) into the skin 3 (three) times daily before meals. Per sliding scale < 150 = 0 units; 150-250 = 5 units; 251-300 = 8 units; 301-350 = 10 units and > 350 notify provider.   levothyroxine 25 MCG tablet Commonly known as:  SYNTHROID, LEVOTHROID Take 1 tablet (25 mcg total) by mouth daily before breakfast.   Melatonin 3 MG Tabs Take 1 tablet (3 mg total) by mouth at bedtime.   montelukast 10 MG tablet Commonly known as:  SINGULAIR Take 10 mg by mouth at bedtime.   pantoprazole 40 MG tablet Commonly known as:  PROTONIX Take 1 tablet (40 mg total) by mouth daily.   sodium bicarbonate 650 MG tablet Take 2 tablets (1,300 mg total) by mouth 2 (two) times daily.   ticagrelor 90 MG Tabs tablet Commonly known as:  BRILINTA Take 1 tablet (90 mg total) by mouth 2 (two) times daily.   torsemide 20 MG tablet Commonly known as:  DEMADEX Take 20 mg by mouth 2 (two) times daily.   vancomycin 125 MG capsule Commonly known as:  VANCOCIN Take 125 mg by mouth. Take one four times a day for 10 days.       Review of Systems  Constitutional: Positive for appetite change. Negative for activity change, chills, fatigue and fever.  HENT: Negative for congestion, rhinorrhea, sinus pain, sinus pressure, sneezing and sore throat.   Eyes: Negative.   Respiratory: Negative for cough, chest tightness and wheezing.   Gastrointestinal: Positive for diarrhea, nausea and vomiting. Negative for abdominal distention, abdominal pain and constipation.       Currently on Vancomycin for C-diff  Endocrine: Negative for polydipsia, polyphagia and polyuria.  Genitourinary: Negative for dysuria, flank pain, frequency and urgency.  Musculoskeletal: Positive for gait problem.  Skin: Negative for color change, pallor and rash.       Right heel and sacral ulcers.   Neurological: Negative for dizziness, seizures, syncope, light-headedness and headaches.    Hematological: Does not bruise/bleed easily.  Psychiatric/Behavioral: Negative for agitation, confusion, hallucinations and sleep disturbance. The patient is not nervous/anxious.     Immunization History  Administered Date(s) Administered  . Influenza,inj,Quad PF,36+ Mos 11/26/2015  . PPD Test 12/04/2015   Pertinent  Health Maintenance Due  Topic Date Due  . FOOT EXAM  07/29/1959  . OPHTHALMOLOGY EXAM  07/29/1959  . URINE MICROALBUMIN  07/29/1959  . COLONOSCOPY  07/29/1999  . PNA vac Low Risk Adult (1 of 2 - PCV13) 07/29/2014  . HEMOGLOBIN A1C  05/16/2016  . INFLUENZA VACCINE  Completed    Vitals:   01/21/16 0837  BP: (!) 108/48  Pulse: (!) 59  Resp: 20  Temp: 97.1 F (36.2 C)  SpO2: 90%  Weight: 218 lb (98.9 kg)  Height: 6' (1.829 m)   Body mass index is 29.57 kg/m. Physical Exam  Constitutional: He is oriented to person, place, and time. He appears well-developed and well-nourished. No distress.  HENT:  Head: Normocephalic.  Mouth/Throat: Oropharynx is clear and moist. No oropharyngeal exudate.  Eyes: Conjunctivae and EOM are normal. Pupils are equal, round, and reactive to light. Right eye exhibits no discharge. Left eye exhibits no discharge. No scleral icterus.  Neck: Normal range of motion. No JVD present. No thyromegaly present.  Cardiovascular: Normal rate, regular rhythm, normal heart sounds and intact distal pulses.  Exam reveals no gallop and no friction rub.   No murmur heard. Pulmonary/Chest: Effort normal and breath sounds normal. No respiratory distress. He has no wheezes. He has no rales.  Abdominal: Soft. Bowel sounds are normal. He exhibits no distension. There is no tenderness. There is no rebound and no guarding.  Genitourinary:  Genitourinary Comments: Incontinent for bowel   Musculoskeletal: He exhibits no tenderness or deformity.  Unsteady.Moves x 4 extremities. Generalized weakness   Lymphadenopathy:    He has no cervical adenopathy.   Neurological: He is oriented to person, place, and time.  Skin: Skin is warm and dry. No rash noted. No erythema. No pallor.  Right heel ulcer resolved. Preventive drsg in place. sacral ulcer pale in color without any drainage surrounding skin tissue without any signs of infections.   Psychiatric: He has a normal mood and affect.    Labs reviewed:  Recent Labs  11/18/15 0404  11/23/15 0601  11/25/15 0744  11/29/15 [id number] 11/30/15 0503 12/01/15 0509  12/16/15 1638 12/17/15 0556 12/18/15 0519 12/22/15  NA 135  < > 138  < > 135  < > 138 137 137  < > 138 139 139 143  K 3.8  < > 3.9  < > 3.9  < > 3.5 3.5 3.4*  < > 3.7 3.7 3.7 4.3  CL 111  < > 109  < > 106  < > 107 106 105  < > 105 105 105  --   CO2 16*  < > 19*  < > 18*  < > 23 21* 22  < > 24 24 26   --   GLUCOSE 229*  < > 118*  < > 236*  < > 172* 222* 253*  < > 156* 100* 120*  --   BUN 41*  < > 56*  < > 57*  < > 75* 81* 75*  < > 62* 63* 59* 56*  CREATININE 3.20*  < > 3.63*  < > 3.54*  < > 3.61* 3.69* 3.58*  < > 4.54* 4.37* 4.38* 4.1*  CALCIUM 8.5*  < > 8.2*  < > 8.4*  < > 8.4* 8.2* 8.5*  < > 8.2* 8.1* 7.6*  --   MG 1.8  --  1.6*  --  1.6*  --   --   --   --   --   --   --   --   --   PHOS 5.1*  < >  --   < > 4.4  < > 4.5 4.5 4.4  --   --   --   --   --   < > = values in this interval not displayed.  Recent Labs  11/23/15 1948  12/01/15 0509 12/08/15 12/16/15 1638 12/17/15 0556  AST  16  --   --  10* 15 15  ALT 13*  --   --  9* 13* 12*  ALKPHOS 76  --   --  107 91 90  BILITOT 0.4  --   --   --  0.8 1.3*  PROT 5.7*  --   --   --  6.6 6.2*  ALBUMIN 1.6*  < > 1.7*  --  2.1* 2.0*  < > = values in this interval not displayed.  Recent Labs  11/23/15 1948  12/02/15 0636  12/16/15 1638 12/17/15 0556 12/17/15 1710 12/18/15 0519 12/22/15  WBC 10.4  < > 12.0*  < > 8.3 7.6  --   --  7.6  NEUTROABS 5.1  --  5.9  --  3.2  --   --   --   --   HGB 7.3*  < > 8.3*  < > 6.7* 7.6* 8.1* 7.7* 7.9*  HCT 22.4*  < > 26.3*  < > 21.3* 23.4*  25.7* 24.7* 26*  MCV 88.5  < > 92.0  --  91.0 91.8  --   --   --   PLT 405*  < > 320  < > 349 313  --   --  263  < > = values in this interval not displayed. Lab Results  Component Value Date   TSH 5.960 (H) 12/17/2015   Lab Results  Component Value Date   HGBA1C 10.0 (H) 11/16/2015   Lab Results  Component Value Date   CHOL 129 11/16/2015   HDL 38 (L) 11/16/2015   LDLCALC 73 11/16/2015   TRIG 89 11/16/2015   CHOLHDL 3.4 11/16/2015    Assessment/Plan Shortness of breath  Oxygen saturation overnight was 90% RA. On call provider ordered oxygen 2 liters [place]. Q shift oxygen saturation monitoring. Portable CXR Pa/Lat r/o PNA Afebrile. Feeling much better this morning. Awaiting CXR results. Obtain CBC/diff, BMP 01/22/2015.   Generalized weakness  Has not been able to exercise with PT/OT just want to lie in bed. Possible due to poor oral intake. CBC/diff and BMP as above.   Nausea and vomiting No nausea this morning. Has Zofran 4 mg Tablet which he has not used.    Poor oral intake Encourage fluid intake to prevent dehydration. BMP in the morning.    Family/ staff Communication: Reviewed plan of care with patient and facility Nurse supervisor.   Labs/tests ordered:   CBC/diff, BMP 01/22/2015.

## 2016-01-22 ENCOUNTER — Encounter: Payer: MEDICARE | Admitting: Physical Medicine & Rehabilitation

## 2016-02-17 ENCOUNTER — Inpatient Hospital Stay: Payer: MEDICARE | Admitting: Physical Medicine & Rehabilitation

## 2016-02-18 DEATH — deceased

## 2018-03-18 IMAGING — DX DG BONE SURVEY MET
10 series · 10 of 10 positions shown · non-contrast
Comparison: Chest x-ray 11/24/2015, abdominal film 09/16/2015

ADDENDUM:
Bilateral twelfth rib fractures noted.
CLINICAL DATA: Elevated serum immunoglobulin free light chains. Pt
could not stand or roll onto his side. Best obtainable images due to
pt condition.

EXAM:
METASTATIC BONE SURVEY

[skull lat]
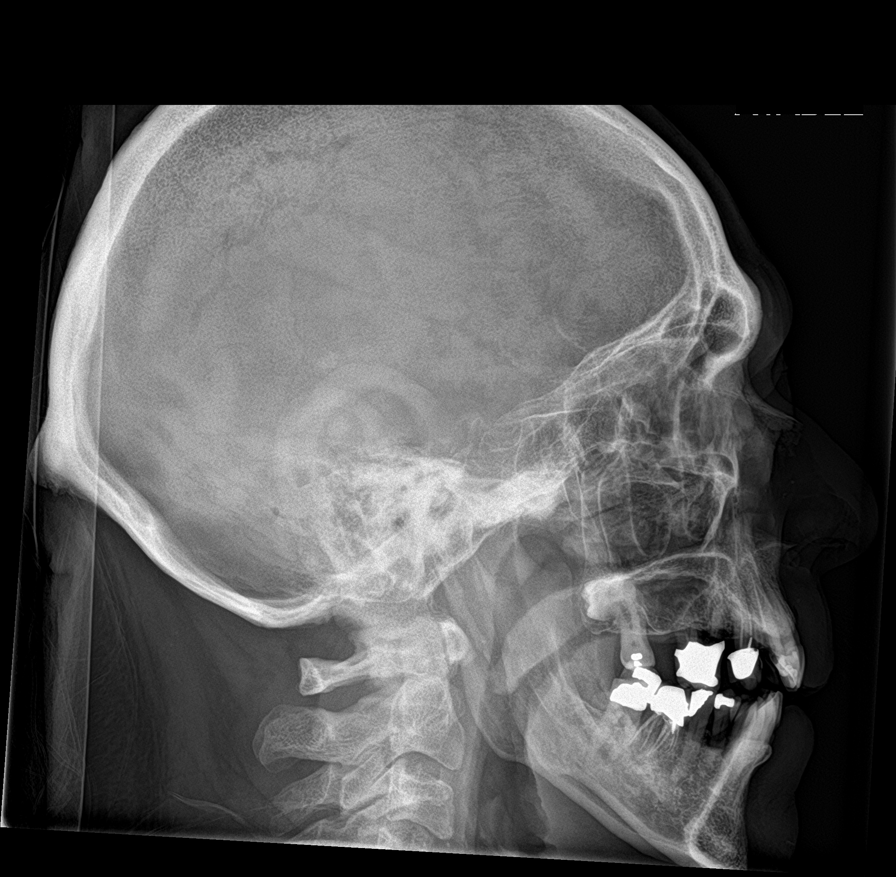

[shoulder ap (1 of 2)]
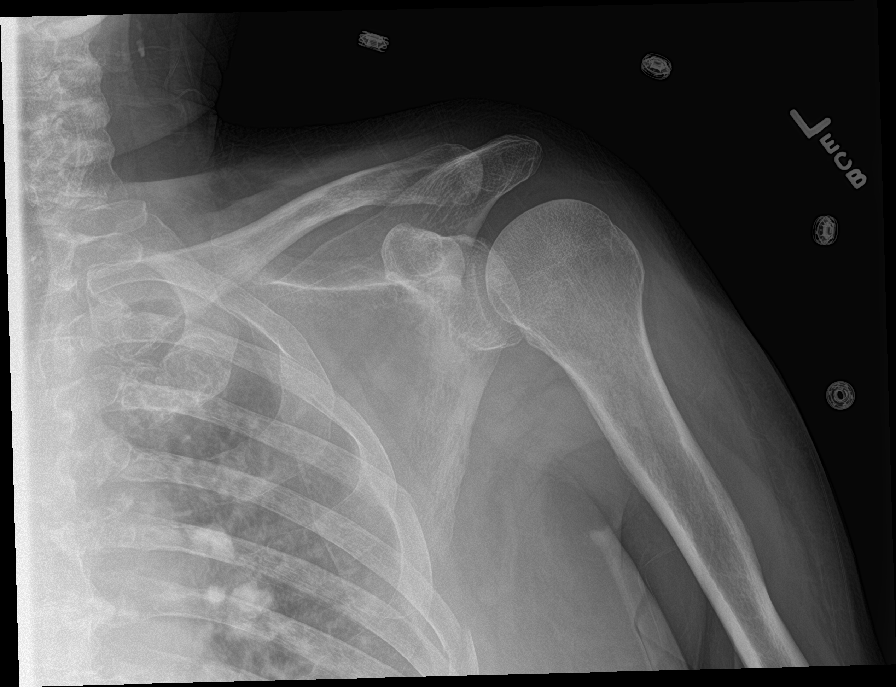

[shoulder ap (2 of 2)]
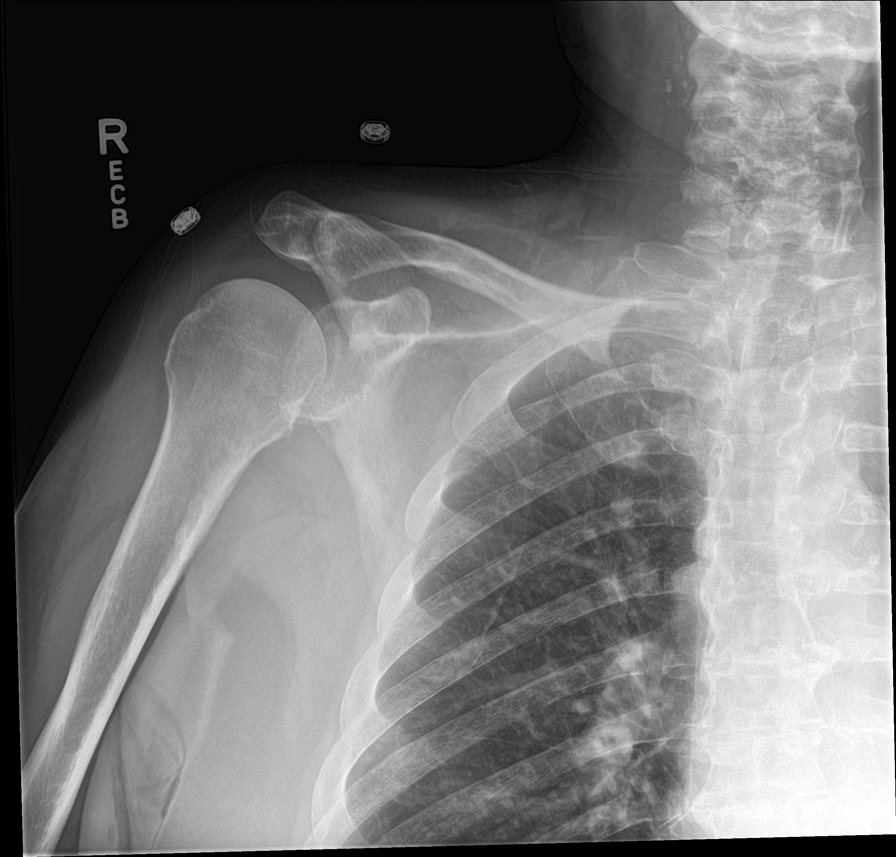

[humerus ap (1 of 2)]
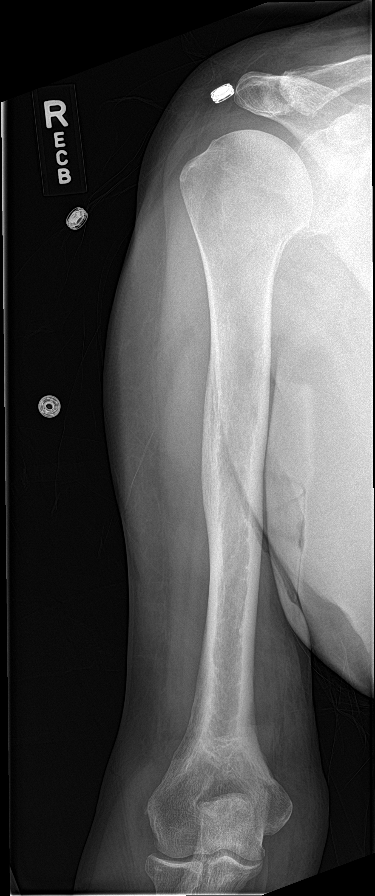

[humerus ap (2 of 2)]
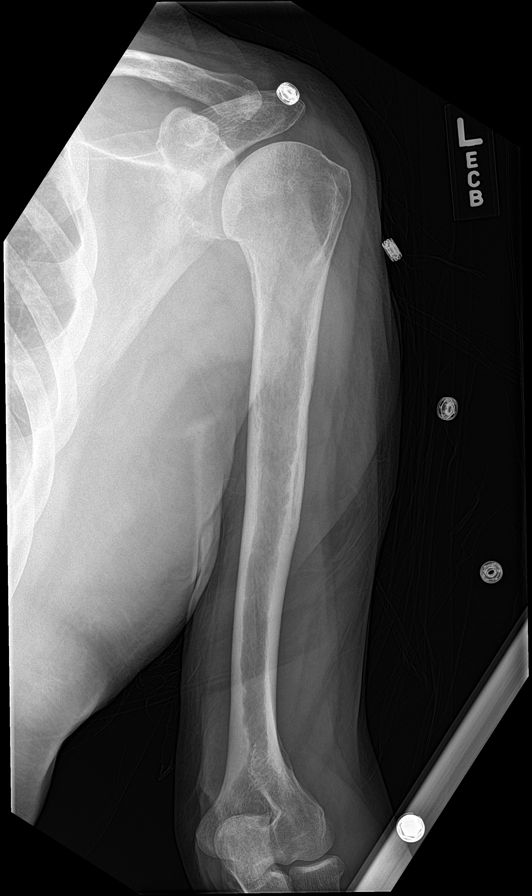

[forearm ap (1 of 2)]
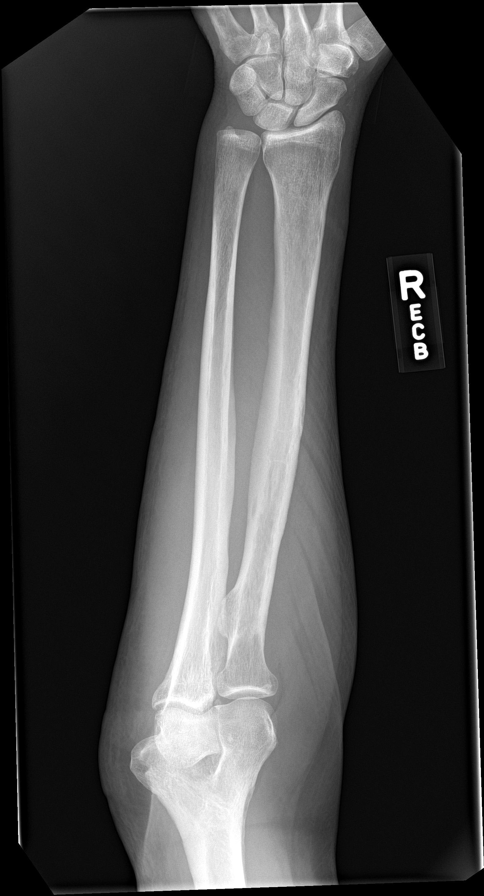

[forearm ap (2 of 2)]
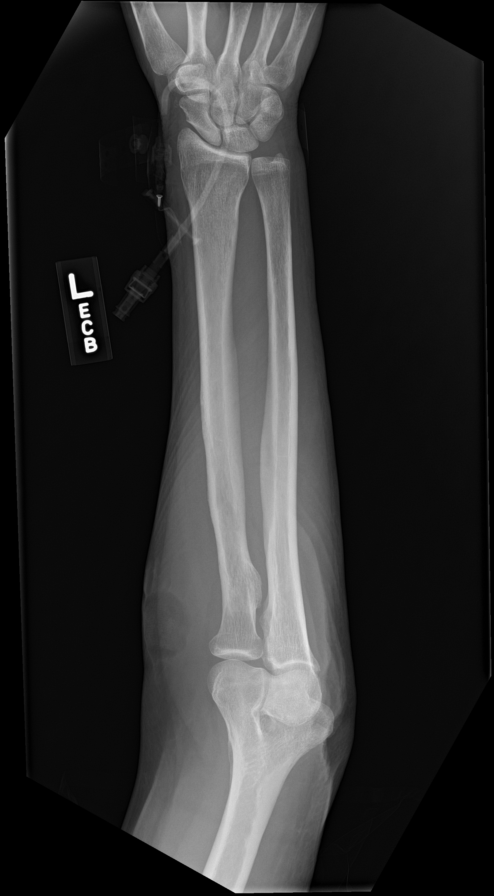

[c-spine ap]
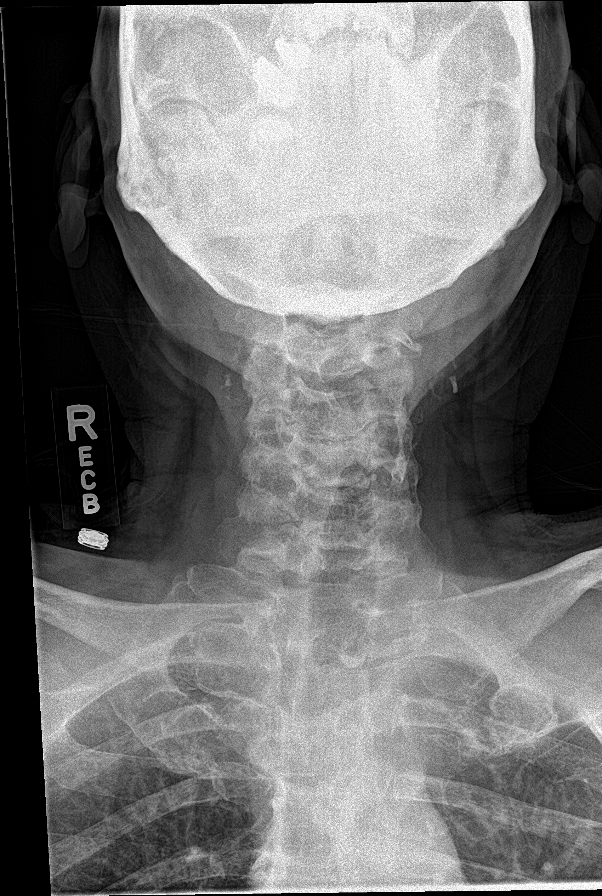

[c-spine lat]
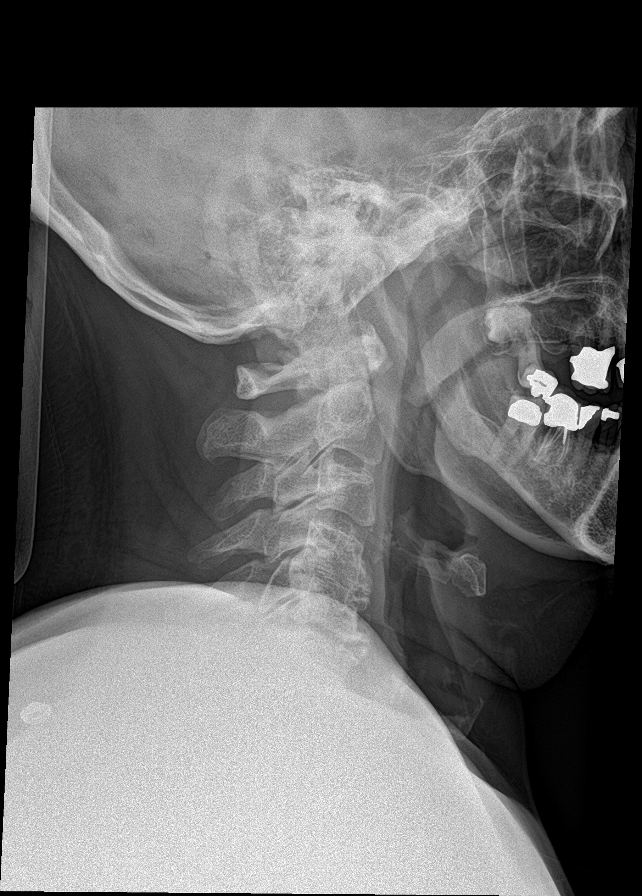

[t-spine ap]
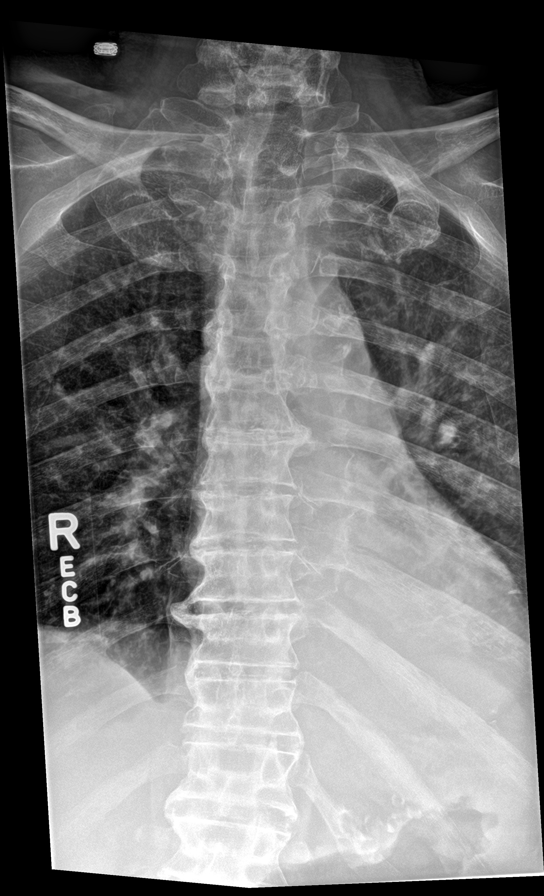

[10 of 10 positions shown; findings below may reference images not displayed]

FINDINGS: Images of the axial and appendicular skeleton are performed.

There are acute fractures of the left fourth and fifth ribs. Heart
size is normal. There is dense opacity in the left lower lobe and
left pleural effusion.

Evaluation of the spine is limited by limited patient mobility.
Degenerative changes are identified in the mid cervical spine. There
is no acute fracture identified. Degenerative changes are identified
throughout the thoracic and lumbar spine. There is vertebral body
height loss at L3, likely chronic based on its appearance.

Deformity of the left superior pubic ramus and possibly left
inferior pubic ramus consistent with fracture.

No suspicious lytic or blastic lesions are identified.

There is dense atherosclerotic calcification of the imaged vessels.
Incidental note of subcutaneous edema. Surgical clips are noted in
the right upper quadrant of the abdomen.
IMPRESSION: 1. No suspicious lytic or blastic lesions.
2. Acute fractures of the left fourth and fifth ribs.
3. Acute fractures of the left superior and possibly inferior pubic
ramus.
4. Atherosclerotic disease.  Subcutaneous edema.
